# Patient Record
Sex: Male | Born: 1945
Health system: Southern US, Community
[De-identification: ages and names within clinical notes are randomized; demographics above are authoritative.]

## PROBLEM LIST (undated history)

## (undated) ENCOUNTER — Ambulatory Visit (INDEPENDENT_AMBULATORY_CARE_PROVIDER_SITE_OTHER): Admission: EM | Payer: Medicare Other

## (undated) DIAGNOSIS — D649 Anemia, unspecified: Secondary | ICD-10-CM

## (undated) DIAGNOSIS — S069X9A Unspecified intracranial injury with loss of consciousness of unspecified duration, initial encounter: Secondary | ICD-10-CM

## (undated) DIAGNOSIS — G4730 Sleep apnea, unspecified: Secondary | ICD-10-CM

## (undated) DIAGNOSIS — I10 Essential (primary) hypertension: Secondary | ICD-10-CM

## (undated) DIAGNOSIS — C61 Malignant neoplasm of prostate: Secondary | ICD-10-CM

## (undated) HISTORY — PX: BASAL CELL CARCINOMA EXCISION: SHX1214

## (undated) HISTORY — DX: Sleep apnea, unspecified: G47.30

## (undated) HISTORY — PX: PROSTATE BIOPSY: SHX241

## (undated) HISTORY — DX: Essential (primary) hypertension: I10

## (undated) HISTORY — PX: BACK SURGERY: SHX140

## (undated) HISTORY — DX: Unspecified intracranial injury with loss of consciousness of unspecified duration, initial encounter: S06.9X9A

---

## 2012-11-03 HISTORY — PX: BILATERAL CARPAL TUNNEL RELEASE: SHX6508

## 2013-11-03 DIAGNOSIS — S069XAA Unspecified intracranial injury with loss of consciousness status unknown, initial encounter: Secondary | ICD-10-CM

## 2013-11-03 DIAGNOSIS — S069X9A Unspecified intracranial injury with loss of consciousness of unspecified duration, initial encounter: Secondary | ICD-10-CM

## 2013-11-03 HISTORY — DX: Unspecified intracranial injury with loss of consciousness of unspecified duration, initial encounter: S06.9X9A

## 2013-11-03 HISTORY — DX: Unspecified intracranial injury with loss of consciousness status unknown, initial encounter: S06.9XAA

## 2017-11-24 ENCOUNTER — Institutional Professional Consult (permissible substitution): Payer: Self-pay | Admitting: Neurology

## 2017-12-02 ENCOUNTER — Encounter: Payer: Self-pay | Admitting: Neurology

## 2017-12-03 ENCOUNTER — Encounter: Payer: Self-pay | Admitting: Neurology

## 2017-12-03 ENCOUNTER — Ambulatory Visit (INDEPENDENT_AMBULATORY_CARE_PROVIDER_SITE_OTHER): Payer: Medicare Other | Admitting: Neurology

## 2017-12-03 VITALS — BP 162/80 | HR 73 | Ht 68.0 in | Wt 155.0 lb

## 2017-12-03 DIAGNOSIS — G4739 Other sleep apnea: Secondary | ICD-10-CM | POA: Insufficient documentation

## 2017-12-03 DIAGNOSIS — S069X1S Unspecified intracranial injury with loss of consciousness of 30 minutes or less, sequela: Secondary | ICD-10-CM | POA: Diagnosis not present

## 2017-12-03 DIAGNOSIS — S060X1A Concussion with loss of consciousness of 30 minutes or less, initial encounter: Secondary | ICD-10-CM | POA: Insufficient documentation

## 2017-12-03 DIAGNOSIS — G4733 Obstructive sleep apnea (adult) (pediatric): Secondary | ICD-10-CM | POA: Diagnosis not present

## 2017-12-03 DIAGNOSIS — S069XAA Unspecified intracranial injury with loss of consciousness status unknown, initial encounter: Secondary | ICD-10-CM | POA: Insufficient documentation

## 2017-12-03 DIAGNOSIS — G4731 Primary central sleep apnea: Secondary | ICD-10-CM

## 2017-12-03 DIAGNOSIS — S060X1S Concussion with loss of consciousness of 30 minutes or less, sequela: Secondary | ICD-10-CM | POA: Diagnosis not present

## 2017-12-03 DIAGNOSIS — Z9989 Dependence on other enabling machines and devices: Secondary | ICD-10-CM | POA: Diagnosis not present

## 2017-12-03 DIAGNOSIS — S069X9A Unspecified intracranial injury with loss of consciousness of unspecified duration, initial encounter: Secondary | ICD-10-CM | POA: Insufficient documentation
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
## 2017-12-07 ENCOUNTER — Other Ambulatory Visit: Payer: Self-pay | Admitting: Neurology

## 2017-12-07 DIAGNOSIS — G4733 Obstructive sleep apnea (adult) (pediatric): Secondary | ICD-10-CM

## 2017-12-10 ENCOUNTER — Institutional Professional Consult (permissible substitution): Payer: Self-pay | Admitting: Neurology

## 2018-01-24 ENCOUNTER — Ambulatory Visit (INDEPENDENT_AMBULATORY_CARE_PROVIDER_SITE_OTHER): Payer: Medicare Other | Admitting: Neurology

## 2018-01-24 DIAGNOSIS — G4731 Primary central sleep apnea: Secondary | ICD-10-CM

## 2018-01-24 DIAGNOSIS — G4733 Obstructive sleep apnea (adult) (pediatric): Secondary | ICD-10-CM

## 2018-01-24 DIAGNOSIS — S069X1S Unspecified intracranial injury with loss of consciousness of 30 minutes or less, sequela: Secondary | ICD-10-CM

## 2018-01-24 DIAGNOSIS — Z9989 Dependence on other enabling machines and devices: Secondary | ICD-10-CM

## 2018-01-24 DIAGNOSIS — S060X1S Concussion with loss of consciousness of 30 minutes or less, sequela: Secondary | ICD-10-CM

## 2018-01-24 DIAGNOSIS — G4739 Other sleep apnea: Secondary | ICD-10-CM
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
## 2018-01-29 NOTE — Addendum Note (Signed)
Addended by: Larey Seat on: 01/29/2018 02:46 PM   Modules accepted: Orders

## 2018-02-02 ENCOUNTER — Telehealth: Payer: Self-pay | Admitting: Neurology

## 2018-02-02 NOTE — Telephone Encounter (Signed)
I called pt. I advised pt that Dr. Brett Fairy reviewed their sleep study results and found that pt has sleep apnea. Dr. Brett Fairy recommends that pt reset his current CPAP at a pressure of 7-10 cm of water pressure. I reviewed PAP compliance expectations with the pt. Pt is agreeable to starting a CPAP. I advised pt that an order will be sent to a DME, [facility], and [facility] will call the pt within about one week after they file with the pt's insurance. [facility] will show the pt how to use the machine, fit for masks, and troubleshoot the CPAP if needed. Pt verbalized understanding of results. Pt had no questions at this time but was encouraged to call back if questions arise. I will touch base with [facility] but I don't believe the pt has to follow up except yearly.

## 2018-02-02 NOTE — Telephone Encounter (Signed)
-----   Message from Larey Seat, MD sent at 01/29/2018  2:46 PM EDT ----- Complex -Obstructive Sleep Apnea responded well to 8  and 9 cm water CPAP pressure, with isolated central apneas.  1. Under CPAP a rebound of REM sleep was noted. 2. Frequent PLMs were recorded which were only seen in N- REM sleep.  PLANS/RECOMMENDATIONS: Cody Gonzalez will remain on auto CPAP,  and the machine will be set to a narrow  pressure window from 7 through  10 cm water, without EPR. His apnea responded well, and oxygen  saturation was sufficient.  He remains on nasal interface. The patient used his own WISP nasal mask in large size.

## 2018-08-24 ENCOUNTER — Ambulatory Visit (INDEPENDENT_AMBULATORY_CARE_PROVIDER_SITE_OTHER): Payer: Medicare Other | Admitting: Neurology

## 2018-08-24 ENCOUNTER — Encounter: Payer: Self-pay | Admitting: Neurology

## 2018-08-24 VITALS — BP 157/75 | HR 65 | Ht 67.0 in | Wt 160.0 lb

## 2018-08-24 DIAGNOSIS — Z9989 Dependence on other enabling machines and devices: Secondary | ICD-10-CM | POA: Diagnosis not present

## 2018-08-24 DIAGNOSIS — G3184 Mild cognitive impairment, so stated: Secondary | ICD-10-CM

## 2018-08-24 DIAGNOSIS — G4733 Obstructive sleep apnea (adult) (pediatric): Secondary | ICD-10-CM

## 2018-08-24 DIAGNOSIS — G4731 Primary central sleep apnea: Secondary | ICD-10-CM | POA: Diagnosis not present
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
## 2018-08-25 ENCOUNTER — Telehealth: Payer: Self-pay | Admitting: *Deleted

## 2018-08-25 NOTE — Telephone Encounter (Signed)
Pt came by for cpap card download.  Downloaded to [facility].  Report to Dr. Brett Fairy to review.

## 2018-08-25 NOTE — Addendum Note (Signed)
Addended by: Larey Seat on: 08/25/2018 04:47 PM   Modules accepted: Orders

## 2018-08-27 NOTE — Telephone Encounter (Signed)
Called pt and LVM (ok per DPR) informing pt that Dr. Brett Fairy will look over his report and then we will call him back.

## 2018-08-27 NOTE — Telephone Encounter (Signed)
Pt requesting a call to discuss his CPAP report

## 2018-08-30 NOTE — Telephone Encounter (Signed)
Pt returning RN calls, will be home until 10:30

## 2018-08-30 NOTE — Telephone Encounter (Signed)
There was a phone message dictated and forwarded to [facility] with the results in summary- excellent compliance, residual AHi a little bit higher than optimal.

## 2018-08-30 NOTE — Telephone Encounter (Signed)
LMVM this am mobile, that AHI was elevated at 9.9.  Did make some changes with cpap and orders sent to [facility].  Pt to call back if questions. Pt called back and I reiterated the results and recommendation.  Will forward new orders to [facility]. He verbalized understanding.

## 2018-09-01 NOTE — Telephone Encounter (Signed)
Cody Gonzalez  From [facility] got order for cpap.

## 2019-02-22 ENCOUNTER — Other Ambulatory Visit (HOSPITAL_COMMUNITY): Payer: Self-pay | Admitting: Urology

## 2019-02-22 ENCOUNTER — Other Ambulatory Visit: Payer: Self-pay | Admitting: Urology

## 2019-02-22 DIAGNOSIS — C61 Malignant neoplasm of prostate: Secondary | ICD-10-CM

## 2019-02-24 ENCOUNTER — Ambulatory Visit: Payer: Medicare Other | Admitting: Neurology

## 2019-02-25 ENCOUNTER — Other Ambulatory Visit: Payer: Self-pay

## 2019-02-25 ENCOUNTER — Encounter (HOSPITAL_COMMUNITY)
Admission: RE | Admit: 2019-02-25 | Discharge: 2019-02-25 | Disposition: A | Discharge status: Home / Self Care | Payer: Medicare Other | Source: Ambulatory Visit | Attending: Urology | Admitting: Urology

## 2019-02-25 DIAGNOSIS — C61 Malignant neoplasm of prostate: Secondary | ICD-10-CM | POA: Diagnosis not present

## 2019-02-25 IMAGING — NM NUCLEAR MEDICINE WHOLE BODY BONE SCINTIGRAPHY
4 series · 4 of 4 positions shown · non-contrast
Comparison: None.

CLINICAL DATA: Prostate cancer

EXAM:
NUCLEAR MEDICINE WHOLE BODY BONE SCAN
TECHNIQUE: Whole body anterior and posterior images were obtained approximately
3 hours after intravenous injection of radiopharmaceutical.
RADIOPHARMACEUTICALS:  19.7 mCi [I4] MDP IV

[Series 1: whole body · 2.66mm/px · 1 of 1 slices shown (1 of 2)]
[im 1/1]
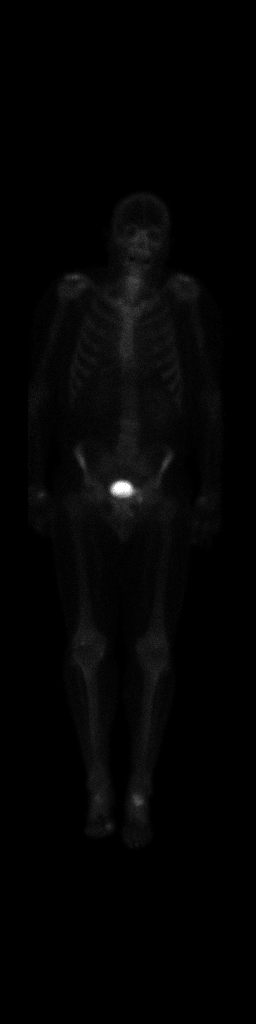

[Series 1: whole body · 2.66mm/px · 1 of 1 slices shown (2 of 2)]
[im 1/1]
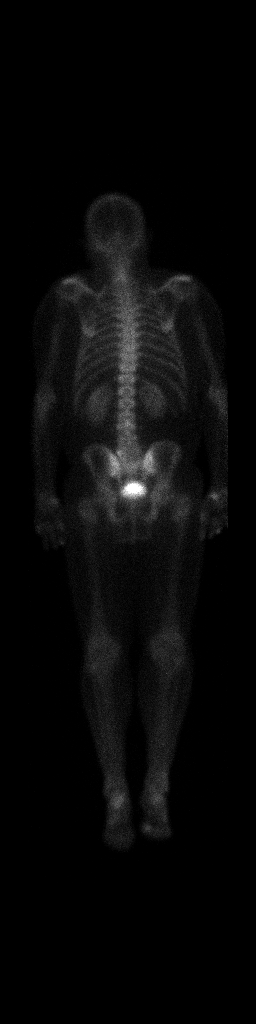

[Series 1: wbr_bone_40 whole body · 2.66mm/px · 1 of 1 slices shown (1 of 2)]
[im 1/1]
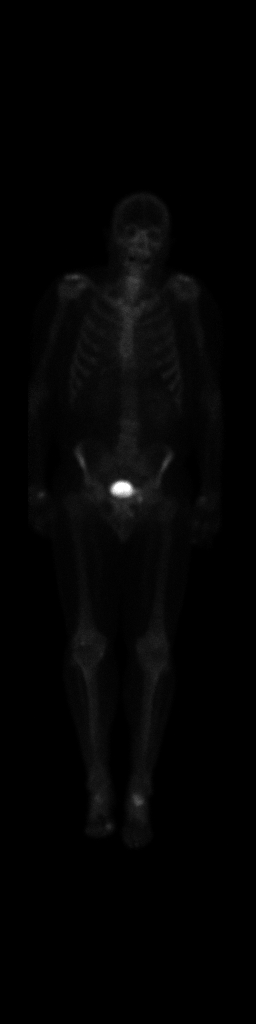

[Series 1: wbr_bone_40 whole body · 2.66mm/px · 1 of 1 slices shown (2 of 2)]
[im 1/1]
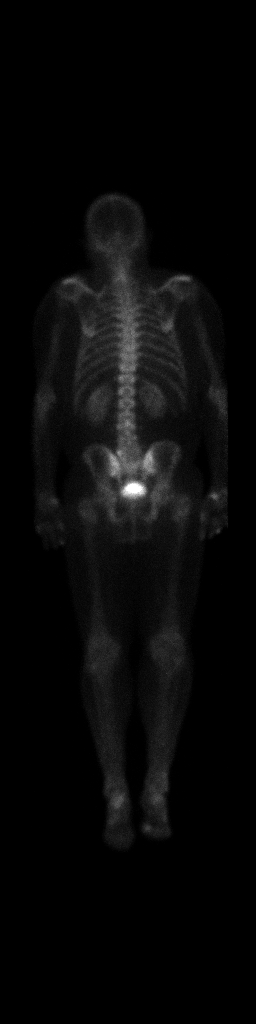

[4 of 4 positions shown; findings below may reference images not displayed]

FINDINGS: No areas of suspicious bony uptake to suggest osseous metastatic
disease. Increased uptake noted in the feet and hands/wrists
compatible with degenerative changes.
IMPRESSION: No evidence of osseous metastatic disease.

## 2019-02-25 MED ORDER — TECHNETIUM TC 99M MEDRONATE IV KIT
19.7000 | PACK | Freq: Once | INTRAVENOUS | Status: AC | PRN
  Administered 2019-02-25: 19.7 via INTRAVENOUS

## 2019-03-09 ENCOUNTER — Telehealth: Payer: Self-pay | Admitting: Neurology

## 2019-03-09 NOTE — Telephone Encounter (Signed)
Pt consented to a Virtual Visit and for the insurance to be billed as such. Cell phone number has been confirmed and the phone carrier they use is [employer].

## 2019-03-14 ENCOUNTER — Encounter: Payer: Self-pay | Admitting: Neurology

## 2019-03-14 NOTE — Telephone Encounter (Signed)

## 2019-03-15 ENCOUNTER — Ambulatory Visit: Payer: Medicare Other | Admitting: Neurology

## 2019-03-15 ENCOUNTER — Other Ambulatory Visit: Payer: Self-pay

## 2019-03-15 ENCOUNTER — Ambulatory Visit (INDEPENDENT_AMBULATORY_CARE_PROVIDER_SITE_OTHER): Payer: Medicare Other | Admitting: Neurology

## 2019-03-15 ENCOUNTER — Encounter: Payer: Self-pay | Admitting: Neurology

## 2019-03-15 DIAGNOSIS — G3184 Mild cognitive impairment, so stated: Secondary | ICD-10-CM | POA: Diagnosis not present

## 2019-03-15 DIAGNOSIS — G4733 Obstructive sleep apnea (adult) (pediatric): Secondary | ICD-10-CM | POA: Diagnosis not present

## 2019-03-15 DIAGNOSIS — R2681 Unsteadiness on feet: Secondary | ICD-10-CM | POA: Diagnosis not present

## 2019-03-15 DIAGNOSIS — Z9989 Dependence on other enabling machines and devices: Secondary | ICD-10-CM | POA: Diagnosis not present

## 2019-03-15 DIAGNOSIS — R4701 Aphasia: Secondary | ICD-10-CM | POA: Insufficient documentation

## 2019-03-15 NOTE — Patient Instructions (Signed)
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
## 2019-03-21 ENCOUNTER — Encounter: Payer: Self-pay | Admitting: Radiation Oncology

## 2019-03-21 NOTE — Progress Notes (Addendum)
GU Location of Tumor / Histology: prostatic adenocarcinoma  If Prostate Cancer, Gleason Score is (4 + 4) and PSA is (9.06)  Cody Gonzalez was noted to have a PSA of 8.46 during a screening with his PCP. Repeat PSA two weeks later confirmed a PSA of 8.56. Patient reports prior PSA in [place] was <4.   Biopsies of prostate (if applicable) revealed:    Past/Anticipated interventions by urology, if any: prostate biopsy, ct (negative), bone scan (negative), referral for consideration of radiotherapy  Surgery vs. Radiation. Patient leaning toward surgery.  Past/Anticipated interventions by medical oncology, if any: no  Weight changes, if any: no  Bowel/Bladder complaints, if any: IPSS 6. SHIM 8. Reports ED managed with sildenafil. Denies dysuria or hematuria. Reports blood tinged semen with orgasm.  Denies urinary leakage or incontinence.  Nausea/Vomiting, if any: no  Pain issues, if any:  no  SAFETY ISSUES:  Prior radiation? no  Pacemaker/ICD? no  Possible current pregnancy? no, male patient  Is the patient on methotrexate? no  Current Complaints / other details:  73 year old male. Married with one daughter and two grandsons. 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   Scheduled to follow up with Dr. Tresa Moore June 2.         NKDA. Preferred name: Rod

## 2019-03-22 ENCOUNTER — Other Ambulatory Visit: Payer: Self-pay

## 2019-03-22 ENCOUNTER — Ambulatory Visit
Admission: RE | Admit: 2019-03-22 | Discharge: 2019-03-22 | Disposition: A | Discharge status: Home / Self Care | Payer: Medicare Other | Source: Ambulatory Visit | Attending: Radiation Oncology | Admitting: Radiation Oncology

## 2019-03-22 ENCOUNTER — Telehealth: Payer: Self-pay | Admitting: *Deleted

## 2019-03-22 ENCOUNTER — Encounter: Payer: Self-pay | Admitting: Radiation Oncology

## 2019-03-22 VITALS — Ht 68.2 in | Wt 165.0 lb

## 2019-03-22 DIAGNOSIS — C61 Malignant neoplasm of prostate: Secondary | ICD-10-CM | POA: Insufficient documentation

## 2019-03-22 HISTORY — DX: Malignant neoplasm of prostate: C61

## 2019-03-22 NOTE — Progress Notes (Signed)
Radiation Oncology         (336) [phone] ________________________________  Initial Outpatient Consultation - Conducted via WebEx due to current COVID-19 concerns for limiting patient exposure  Name: Cody Gonzalez MRN: [id number]  Date: 03/22/2019  DOB: 1946-05-08  NL:[id number], Beryle Lathe, FNP  Alexis Frock, MD   REFERRING PHYSICIAN: Alexis Frock, MD  DIAGNOSIS: 73 y.o. gentleman with Stage T1c adenocarcinoma of the prostate with Gleason score of 4+4 and PSA of 9.06.    ICD-10-CM   1. Malignant neoplasm of prostate ([place]) C61     HISTORY OF PRESENT ILLNESS: Cody Gonzalez is a 73 y.o. male with a diagnosis of prostate cancer. He was noted to have an elevated PSA in October 2019 of 8.46 by his primary care provider, Jillyn Ledger, NP.  He had recently relocated from [place], [place] to [place] to be closer to his grandkids and reports normal PSA values with his urologist in [place] previously.  Repeat PSA two weeks later remained elevated at 8.56.  Accordingly, he was referred for evaluation in urology by Dr. Tresa Moore on 09/28/2018, and digital rectal examination was performed at that time revealing mild asymmetry, but no frank nodules. The recommendation at that time was to proceed with PSA monitoring. A repeat PSA 01/20/19 was elevated further to 9.06. Therefore, the patient proceeded to transrectal ultrasound with 12 biopsies of the prostate on 02/15/2019.  The prostate volume measured 34 cc.  Out of 12 core biopsies, 6 were positive, all on the left.  The maximum Gleason score was 4+4, and this was seen in all six cores on the left.  Biopsies of prostate revealed:    Staging studies include bone scan performed 02/25/2019 which was negative for osseous metastatic disease and a CT scan of the abdomen/pelvis on 03/03/2019 which was negative for adenopathy or visceral metastatic disease in the abdomen or pelvis but did note a small 4 mm pulmonary nodule as well as a very small, 6 mm  left renal mass which were felt to be indeterminate and the recommendation was to continue with monitoring.   The patient reviewed the biopsy and imaging results with his urologist and he has kindly been referred today for discussion of potential radiation treatment options.  PREVIOUS RADIATION THERAPY: No  PAST MEDICAL HISTORY:  Past Medical History:  Diagnosis Date   Hypertension    Prostate cancer ([place])    Sleep apnea    TBI (traumatic brain injury) ([place])       PAST SURGICAL HISTORY: Past Surgical History:  Procedure Laterality Date   PROSTATE BIOPSY      FAMILY HISTORY:  Family History  Problem Relation Age of Onset   Brain cancer Father        GBM    SOCIAL HISTORY:  Social History   Socioeconomic History   Marital status: Married    Spouse name: Not on file   Number of children: Not on file   Years of education: Not on file   Highest education level: Not on file  Occupational History   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: retired  Scientist, product/process development strain: Not on file   Food insecurity:    Worry: Not on file    Inability: Not on file   Transportation needs:    Medical: Not on file    Non-medical: Not on file  Tobacco Use   Smoking status: Never Smoker   Smokeless tobacco: Never Used  Substance and Sexual Activity   Alcohol  use: Yes    Alcohol/week: 14.0 standard drinks    Types: 14 Cans of beer per week    Frequency: Never   Drug use: No   Sexual activity: Yes    Comment: with aid of sildenafil  Lifestyle   Physical activity:    Days per week: Not on file    Minutes per session: Not on file   Stress: Not on file  Relationships   Social connections:    Talks on phone: Not on file    Gets together: Not on file    Attends religious service: Not on file    Active member of club or organization: Not on file    Attends meetings of clubs or organizations: Not on file    Relationship status: Not on file   Intimate  partner violence:    Fear of current or ex partner: Not on file    Emotionally abused: Not on file    Physically abused: Not on file    Forced sexual activity: Not on file  Other Topics Concern   Not on file  Social History Narrative   Married. 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 Preferred name: Cody Gonzalez. Enjoys cycling.    ALLERGIES: Patient has no known allergies.  MEDICATIONS:  Current Outpatient Medications  Medication Sig Dispense Refill   amLODipine (NORVASC) 5 MG tablet Take 5 mg by mouth daily.     lisinopril (PRINIVIL,ZESTRIL) 2.5 MG tablet Take 2.5 mg by mouth daily.     No current facility-administered medications for this encounter.     REVIEW OF SYSTEMS:  On review of systems, the patient reports that he is doing well overall.  He remains physically active and enjoys cycling.  He denies any chest pain, shortness of breath, cough, fevers, chills, night sweats, or unintended weight changes. He denies any bowel disturbances, and denies abdominal pain, nausea or vomiting. He denies any new musculoskeletal or joint aches or pains. His IPSS was 6, indicating mild urinary symptoms. He denies dysuria, hematuria, leakage or incontinence. His SHIM was 8, indicating he does have moderate erectile dysfunction, managed with sildenafil. He reports blood-tinged semen on occasion. A complete review of systems is obtained and is otherwise negative.  PHYSICAL EXAM:  Wt Readings from Last 3 Encounters:  03/22/19 165 lb (74.8 kg)  08/24/18 160 lb (72.6 kg)  12/03/17 155 lb (70.3 kg)   Temp Readings from Last 3 Encounters:  No data found for Temp   BP Readings from Last 3 Encounters:  08/24/18 (!) 157/75  12/03/17 (!) 162/80   Pulse Readings from Last 3 Encounters:  08/24/18 65  12/03/17 73   Pain Assessment Pain Score: 0-No pain/10  In general this is a well appearing caucasian male in no acute distress. He is alert and oriented x4 and  appropriate throughout the examination. Cardiopulmonary assessment is negative for acute distress and he exhibits normal effort. Remainder of exam is not performed in light of virtual consultation platform.  KPS = 100  100 - Normal; no complaints; no evidence of disease. 90   - Able to carry on normal activity; minor signs or symptoms of disease. 80   - Normal activity with effort; some signs or symptoms of disease. 29   - Cares for self; unable to carry on normal activity or to do active work. 60   - Requires occasional assistance, but is able to care for most of his personal needs. 50   -  Requires considerable assistance and frequent medical care. 93   - Disabled; requires special care and assistance. 63   - Severely disabled; hospital admission is indicated although death not imminent. 53   - Very sick; hospital admission necessary; active supportive treatment necessary. 10   - Moribund; fatal processes progressing rapidly. 0     - Dead  Karnofsky DA, Abelmann WH, Craver LS and Burchenal JH [phone]) The use of the nitrogen mustards in the palliative treatment of carcinoma: with particular reference to bronchogenic carcinoma Cancer 1 634-56  LABORATORY DATA:  No results found for: WBC, HGB, HCT, MCV, PLT No results found for: NA, K, CL, CO2 No results found for: ALT, AST, GGT, ALKPHOS, BILITOT   RADIOGRAPHY: Nm Bone Scan Whole Body  Result Date: 02/25/2019 CLINICAL DATA:  Prostate cancer EXAM: NUCLEAR MEDICINE WHOLE BODY BONE SCAN TECHNIQUE: Whole body anterior and posterior images were obtained approximately 3 hours after intravenous injection of radiopharmaceutical. RADIOPHARMACEUTICALS:  19.7 mCi Technetium-11m MDP IV COMPARISON:  None. FINDINGS: No areas of suspicious bony uptake to suggest osseous metastatic disease. Increased uptake noted in the feet and hands/wrists compatible with degenerative changes. IMPRESSION: No evidence of osseous metastatic disease. Electronically Signed   By:  Rolm Baptise M.D.   On: 02/25/2019 19:09      IMPRESSION/PLAN: 1. 73 y.o. gentleman with Stage T1c adenocarcinoma of the prostate with Gleason Score of 4+4, and PSA of 9.06. We discussed the patient's workup and outlined the nature of prostate cancer in this setting. The patient's T stage, Gleason's score, and PSA put him into the high risk group. Accordingly, he is eligible for a variety of potential treatment options including radical prostatectomy or LT-ADT in combination with either 8 weeks of daily external beam radiation or a brachytherapy boost followed by 5 weeks of daily external beam radiation. We discussed the available radiation techniques, and focused on the details and logistics of delivery. We discussed and outlined the risks, benefits, short and long-term effects associated with radiotherapy and compared and contrasted these with prostatectomy. We discussed the role of SpaceOAR in reducing the rectal toxicity associated with radiotherapy. We also detailed the role of ADT in the treatment of high risk prostate cancer and outlined the associated side effects that could be expected with this therapy.  The patient and his wife were encouraged to ask questions that were answered to their stated satisfaction.   At the end of the conversation the patient is interested in moving forward with LT-ADT in combination with an up front brachytherapy boost with placement of SpaceOAR gel to reduce rectal toxicity from radiotherapy followed by a 5 week course of daily radiotherapy. He has not received his first Lupron injection. We will share our discussion with Dr. Tresa Moore and move forward with scheduling a follow up visit to start ADT now with plans to coordinate his brachytherapy boost from sometime in July 2020, approximately 8 weeks after starting ADT.  Romie Jumper in our office will be working closely with him to coordinate OR scheduling and pre and post procedure appointments.  We will contact the  pharmaceutical rep to ensure that [place] is available at the time of procedure.  He will have a prostate MRI following his post-seed CT SIM to confirm appropriate distribution of the [place]. He will also have prostate CT SIM at the time of his post-seed visit for treatment planning/set up to begin 5 weeks of prostate IMRT approximately 3 weeks after his brachytherapy procedure.  The patient appears to  have a good understanding of his overall disease and our treatment recommendations which are of curative intent.  He is comfortable with and in agreement with the stated plan.   This encounter was provided by video telemedicine platform Webex.  The patient has given verbal consent for this type of encounter and has been advised to only accept a meeting of this type in a secure network environment. The time spent during this encounter was 70 minutes. The attendants for this meeting include Tyler Pita MD, Freeman Caldron PA-C, scribe Clinton Sawyer, patient Cody Gonzalez and his wife. During the encounter, Tyler Pita MD, Ashlyn Bruning PA-C, and scribe Clinton Sawyer were located at [facility] Radiation Oncology Department.  Patient Cody Gonzalez and wife were located at home.     Cody Johns, PA-C    Tyler Pita, MD  [facility] Direct Dial: [phone]   Fax: [phone] [email or web address]   Skype   LinkedIn  This document serves as a record of services personally performed by Tyler Pita, MD and Freeman Caldron, PA-C. It was created on their behalf by Rae Lips, a trained medical scribe. The creation of this record is based on the scribe's personal observations and the providers' statements to them. This document has been checked and approved by the attending providers.

## 2019-03-22 NOTE — Progress Notes (Signed)
See progress note under physician encounter. 

## 2019-03-22 NOTE — Telephone Encounter (Signed)
CALLED PATIENT TO INFORM OF ADT APPT. WITH DR. Davis ON 04-05-19 - ARRIVAL TIME- 8 AM, SPOKE WITH PATIENT AND HE IS AWARE OF THIS APPT.

## 2019-04-05 ENCOUNTER — Encounter: Payer: Self-pay | Admitting: Medical Oncology

## 2019-04-05 ENCOUNTER — Telehealth: Payer: Self-pay | Admitting: Medical Oncology

## 2019-04-05 NOTE — Telephone Encounter (Signed)
Called l to introduce myself as the prostate nurse navigator and discuss my role. I was unable to meet him 5/19 when he consulted with Dr. Tammi Klippel via WebEx. He was seen today in follow up by Dr. Tresa Moore and started on Bicalutamide and will return 6/16 for Lupron injection. I informed him I am working remotely and asked him to leave questions or concerns on my voicemail and I will return his call.

## 2019-04-08 ENCOUNTER — Telehealth: Payer: Self-pay | Admitting: Radiation Oncology

## 2019-04-08 NOTE — Telephone Encounter (Signed)
Received voicemail message from patient that he is attempting to return a call from "someone in this office." Phone patient back. No answer. Left voicemail message that the last telephone encounter was on 04/05/2019 from Cira Rue, prostate navigator. I left Robin's number as well as mine in the event the patient wanted to reach out or had needs.

## 2019-04-21 ENCOUNTER — Telehealth: Payer: Self-pay | Admitting: *Deleted

## 2019-04-21 NOTE — Telephone Encounter (Signed)
CALLED PATIENT TO INFORM OF PRE-SEED PLANNING CT ON 05-19-19 AND HIS IMPLANT ON 06-29-19, SPOKE WITH PATIENT AND HE IS AWARE OF THESE APPTS.

## 2019-04-22 ENCOUNTER — Other Ambulatory Visit: Payer: Self-pay | Admitting: Urology

## 2019-04-22 ENCOUNTER — Telehealth: Payer: Self-pay | Admitting: Radiation Oncology

## 2019-04-22 NOTE — Telephone Encounter (Signed)
Received voicemail message from patient's wife expressing confusion about treatment plan. Phoned back and spoke with patient and wife over speaker phone. Both are of the understanding that two weeks s/p ADT injection his seeds would be implanted. Patient endorses receiving ADT on 04/19/2019. Patient expressed he was surprised when Enid Derry called and scheduled him out for two months. Wife adds they were both of the understanding that this "ordeal would be over come the end of August." Read to them consult note impression:  At the end of the conversation the patient is interested in moving forward with LT-ADT in combination with an up front brachytherapy boost with placement of SpaceOAR gel to reduce rectal toxicity from radiotherapy followed by a 5 week course of daily radiotherapy. He has not received his first Lupron injection. We will share our discussion with Dr. Tresa Moore and move forward with scheduling a follow up visit to start ADT now with plans to coordinate his brachytherapy boost from sometime in July 2020, approximately 8 weeks after starting ADT.  Romie Jumper in our office will be working closely with him to coordinate OR scheduling and pre and post procedure appointments.  We will contact the pharmaceutical rep to ensure that [place] is available at the time of procedure.  He will have a prostate MRI following his post-seed CT SIM to confirm appropriate distribution of the [place]. He will also have prostate CT SIM at the time of his post-seed visit for treatment planning/set up to begin 5 weeks of prostate IMRT approximately 3 weeks after his brachytherapy procedure.  The patient appears to have a good understanding of his overall disease and our treatment recommendations which are of curative intent.  He is comfortable with and in agreement with the stated plan  Wife felt confirmation was warranted. Explained this RN will inform Dr. Tammi Klippel and Freeman Caldron, PA of these findings and myself or  Ashlyn would reach back out to them for further clarification. They were both in agreement with that plan

## 2019-04-22 NOTE — Telephone Encounter (Signed)
Phoned patient back. Spoke with he and his wife over speaker phone. Read the following: Confirmed, the information you shared with the patient and wife from his consult note is correct and is what was discussed during the visit.The reference to "completion of treatment by end of August" in the conversation was prefaced by "for example, if you are able to get in with Dr. Tresa Moore to start ADT in May, we would be ready to proceed with seed boost in July followed by 5 weeks of daily radiation treatment that could be completed by the end of August but this is dependant on how quickly Dr. Tresa Moore can get you in to start your ADT".  We had previously discussed the rationale for waiting 8 weeks after the start of ADT prior to initiating radiation so that the cancer cells would be sensitized to the radiation, increasing the effectiveness of the overall treatment.  I understand the frustration with the delay of treatment but it was completely out of our control as to when he could start ADT, likely secondary to Urology waiting for insurance approval, and it is to his advantage and in his best interest to wait the recommended 2 month time period from start of ADT before starting radiation to give him the best chance for treatment success/cure.  Please share this information with the patient and his wife and let me know if they need anything further. -Ashlyn  Answered subsequent questions to the best of my ability. Wife wants to ensure Dr. Tammi Klippel is aware the patient has ten pills of bicalutamide left which he began on 04/05/2019 before receiving Lupron on 04/19/2019. Confirmed pre seed appointments. Explained why pre seed CT scan was needed to the best of my ability. Wife reports the patient is scheduled to have blood work on 07/19/2019 at Dr. Zettie Pho office with a follow up to review the results on 07/26/2019.   Spent approximately 20 minutes on phone with patient and wife. Both verbalized understanding of all reviewed. Provided  my direct number for future questions.

## 2019-04-22 NOTE — Telephone Encounter (Signed)
During phone conversation at 1622 discussed with patient and wife how to set up mychart account providing activation code. Understanding of this process was verbalized by both.

## 2019-04-22 NOTE — Telephone Encounter (Signed)
Confirmed, the information you shared with the patient and wife from his consult note is correct and is what was discussed during the visit.The reference to "completion of treatment by end of August" in the conversation was prefaced by "for example, if you are able to get in with Dr. Tresa Moore to start ADT in May, we would be ready to proceed with seed boost in July followed by 5 weeks of daily radiation treatment that could be completed by the end of August but this is dependant on how quickly Dr. Tresa Moore can get you in to start your ADT".  We had previously discussed the rationale for waiting 8 weeks after the start of ADT prior to initiating radiation so that the cancer cells would be sensitized to the radiation, increasing the effectiveness of the overall treatment.  I understand the frustration with the delay of treatment but it was completely out of our control as to when he could start ADT, likely secondary to Urology waiting for insurance approval, and it is to his advantage and in his best interest to wait the recommended 2 month time period from start of ADT before starting radiation to give him the best chance for treatment success/cure.  Please share this information with the patient and his wife and let me know if they need anything further. -Karma Hiney

## 2019-05-09 ENCOUNTER — Telehealth: Payer: Self-pay | Admitting: Medical Oncology

## 2019-05-09 NOTE — Telephone Encounter (Signed)
Called patient and spoke with wife, Cody Gonzalez to confirm appointments and address any concerns. They have had concerns about treatment and scheduling. She states she was able to discuss with Sam and has a better understanding of plan. Appointments confirmed for CT simulation and seed implant. She voiced concerns about not being able to see records in My Chart and Sam was mailing her some information but she has not received. I informed her that the mail room was closed on Friday due to the 4th, so it may take a few extra days. She states that is fine. She was not able to locate my office number, so I gave it to her again and encouraged them  to call me or Sam, if she has further questions or concerns. She voiced understanding and thanked me for following up.

## 2019-05-18 ENCOUNTER — Telehealth: Payer: Self-pay | Admitting: *Deleted

## 2019-05-18 NOTE — Telephone Encounter (Signed)
CALLED PATIENT TO REMIND OF PRE-SEED APPTS. FOR 05-19-19, SPOKE WITH PATIENT AND HE IS AWARE OF THESE APPTS.

## 2019-05-19 ENCOUNTER — Ambulatory Visit
Admission: RE | Admit: 2019-05-19 | Discharge: 2019-05-19 | Disposition: A | Discharge status: Home / Self Care | Payer: Medicare Other | Source: Ambulatory Visit | Attending: Radiation Oncology | Admitting: Radiation Oncology

## 2019-05-19 ENCOUNTER — Other Ambulatory Visit: Payer: Self-pay | Admitting: Urology

## 2019-05-19 ENCOUNTER — Encounter: Payer: Self-pay | Admitting: Medical Oncology

## 2019-05-19 ENCOUNTER — Encounter (HOSPITAL_COMMUNITY)
Admission: RE | Admit: 2019-05-19 | Discharge: 2019-05-19 | Disposition: A | Discharge status: Home / Self Care | Payer: Medicare Other | Source: Ambulatory Visit | Attending: Urology | Admitting: Urology

## 2019-05-19 ENCOUNTER — Ambulatory Visit (HOSPITAL_COMMUNITY)
Admission: RE | Admit: 2019-05-19 | Discharge: 2019-05-19 | Disposition: A | Discharge status: Home / Self Care | Payer: Medicare Other | Source: Ambulatory Visit | Attending: Urology | Admitting: Urology

## 2019-05-19 ENCOUNTER — Other Ambulatory Visit: Payer: Self-pay

## 2019-05-19 ENCOUNTER — Ambulatory Visit
Admission: RE | Admit: 2019-05-19 | Discharge: 2019-05-19 | Disposition: A | Discharge status: Home / Self Care | Payer: Medicare Other | Source: Ambulatory Visit | Attending: Urology | Admitting: Urology

## 2019-05-19 ENCOUNTER — Encounter: Payer: Self-pay | Admitting: Adult Health

## 2019-05-19 DIAGNOSIS — C61 Malignant neoplasm of prostate: Secondary | ICD-10-CM | POA: Insufficient documentation

## 2019-05-19 DIAGNOSIS — Z01818 Encounter for other preprocedural examination: Secondary | ICD-10-CM | POA: Insufficient documentation

## 2019-05-19 IMAGING — DX CHEST - 2 VIEW
2 series · 2 of 2 positions shown · non-contrast
Comparison: None.

CLINICAL DATA: Preoperative respiratory evaluation prior to
brachytherapy for prostate cancer. Current history of sleep apnea.

EXAM:
CHEST - 2 VIEW

[chest pa]
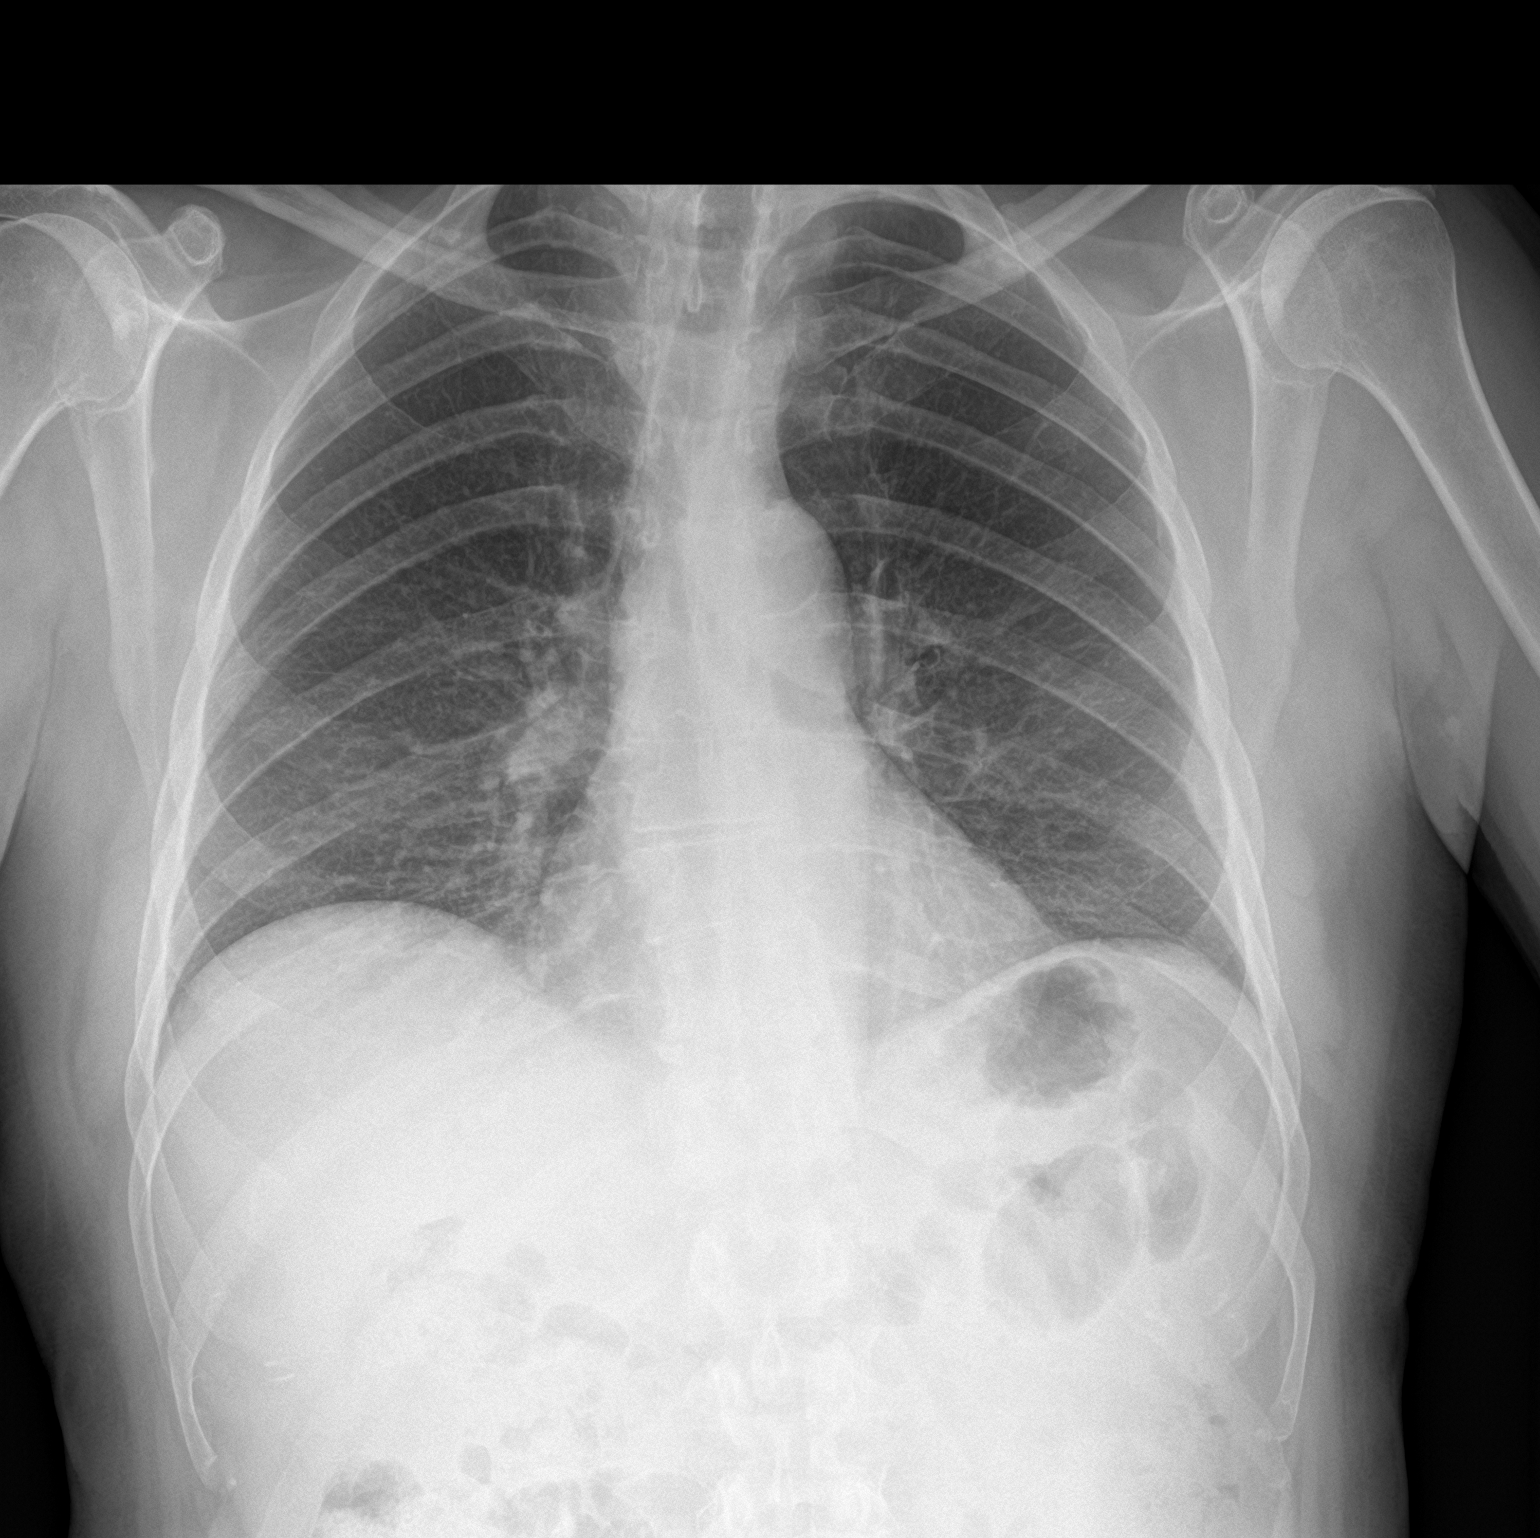

[chest lat]
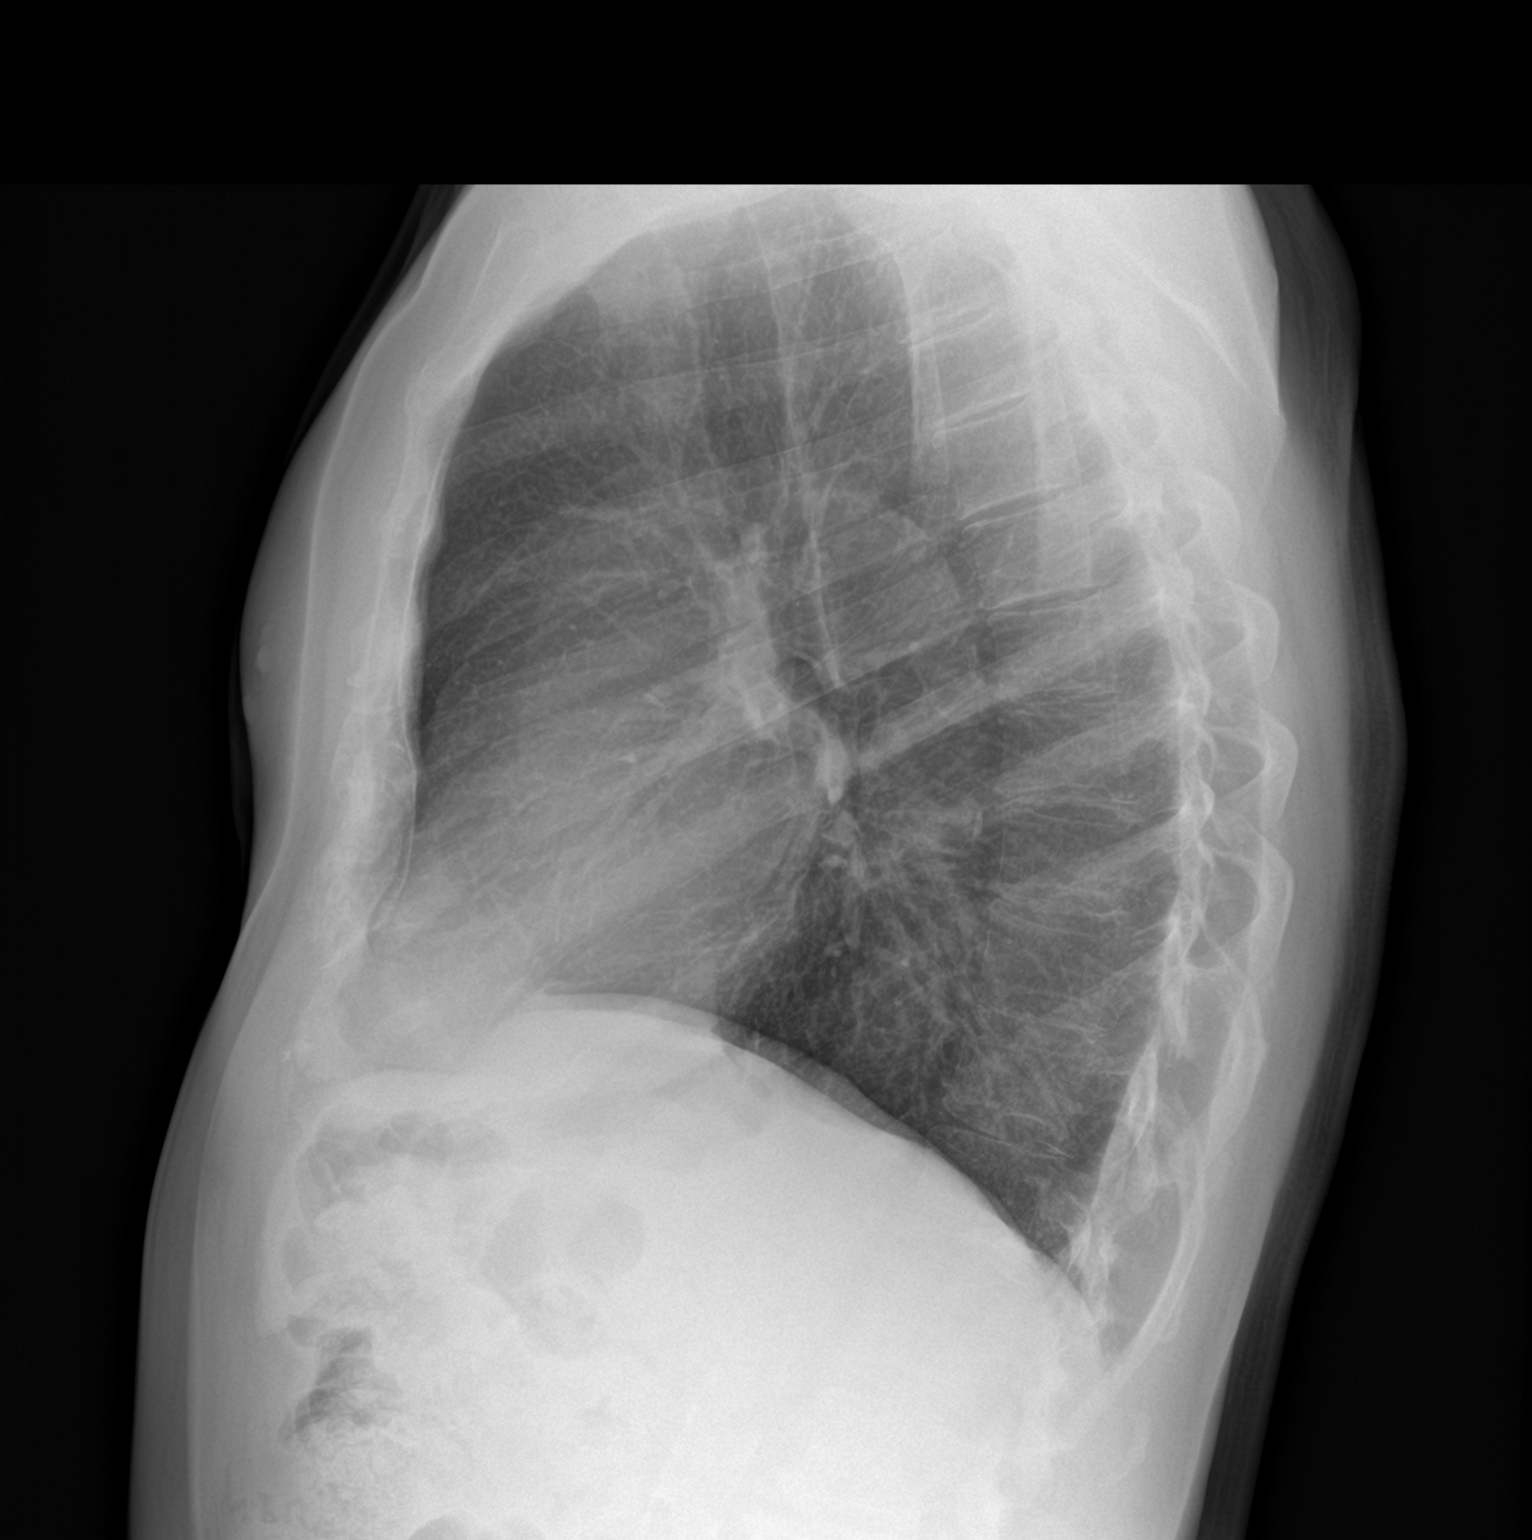

[2 of 2 positions shown; findings below may reference images not displayed]

FINDINGS: Cardiac silhouette normal in size. Thoracic aorta mildly
atherosclerotic. Hilar and mediastinal contours otherwise
unremarkable. Lungs clear. Bronchovascular markings normal.
Pulmonary vascularity normal. No visible pleural effusions. No
pneumothorax. Mild thoracic dextroscoliosis. Visualized bony thorax
otherwise unremarkable.
IMPRESSION: No acute cardiopulmonary disease.

## 2019-05-19 NOTE — Progress Notes (Signed)
  Radiation Oncology         (336) [phone] ________________________________  Name: Cody Gonzalez MRN: [id number]  Date: 05/19/2019  DOB: 01/17/1946  SIMULATION AND TREATMENT PLANNING NOTE PUBIC ARCH STUDY  VK:PQAES, Beryle Lathe, FNP  [name], FNP  DIAGNOSIS: 73 y.o. gentleman with Stage T1c adenocarcinoma of the prostate with Gleason score of 4+4 and PSA of 9.06.     ICD-10-CM   1. Malignant neoplasm of prostate ([place])  C61     COMPLEX SIMULATION:  The patient presented today for evaluation for possible prostate seed implant. He was brought to the radiation planning suite and placed supine on the CT couch. A 3-dimensional image study set was obtained in upload to the planning computer. There, on each axial slice, I contoured the prostate gland. Then, using three-dimensional radiation planning tools I reconstructed the prostate in view of the structures from the transperineal needle pathway to assess for possible pubic arch interference. In doing so, I did not appreciate any pubic arch interference. Also, the patient's prostate volume was estimated based on the drawn structure. The volume was 26 cc which is less than his ultrasound volume of 34 cc, probably due to ADT.  Given the pubic arch appearance and prostate volume, patient remains a good candidate to proceed with prostate seed implant. Today, he freely provided informed written consent to proceed.    PLAN: The patient will undergo prostate seed implant.   ________________________________  Sheral Apley. Tammi Klippel, M.D.

## 2019-06-22 ENCOUNTER — Telehealth: Payer: Self-pay | Admitting: *Deleted

## 2019-06-22 NOTE — Telephone Encounter (Signed)
RETURNED PATIENT'S PHONE CALL, LVM FOR A RETURN CALL 

## 2019-06-24 ENCOUNTER — Telehealth: Payer: Self-pay | Admitting: *Deleted

## 2019-06-24 NOTE — Telephone Encounter (Signed)
RETURNED PATIENT'S PHONE CALL, LVM  

## 2019-06-25 ENCOUNTER — Other Ambulatory Visit (HOSPITAL_COMMUNITY)
Admission: RE | Admit: 2019-06-25 | Discharge: 2019-06-25 | Disposition: A | Discharge status: Home / Self Care | Payer: Medicare Other | Source: Ambulatory Visit | Attending: Urology | Admitting: Urology

## 2019-06-25 DIAGNOSIS — Z20828 Contact with and (suspected) exposure to other viral communicable diseases: Secondary | ICD-10-CM | POA: Insufficient documentation

## 2019-06-25 DIAGNOSIS — Z01812 Encounter for preprocedural laboratory examination: Secondary | ICD-10-CM | POA: Insufficient documentation

## 2019-06-25 LAB — SARS CORONAVIRUS 2 (TAT 6-24 HRS): SARS Coronavirus 2: NEGATIVE

## 2019-06-27 ENCOUNTER — Encounter (HOSPITAL_COMMUNITY)
Admission: RE | Admit: 2019-06-27 | Discharge: 2019-06-27 | Disposition: A | Discharge status: Home / Self Care | Payer: Medicare Other | Source: Ambulatory Visit | Attending: Urology | Admitting: Urology

## 2019-06-27 ENCOUNTER — Other Ambulatory Visit: Payer: Self-pay

## 2019-06-27 DIAGNOSIS — I10 Essential (primary) hypertension: Secondary | ICD-10-CM | POA: Insufficient documentation

## 2019-06-27 DIAGNOSIS — Z79899 Other long term (current) drug therapy: Secondary | ICD-10-CM | POA: Insufficient documentation

## 2019-06-27 DIAGNOSIS — F419 Anxiety disorder, unspecified: Secondary | ICD-10-CM | POA: Diagnosis not present

## 2019-06-27 DIAGNOSIS — Z87820 Personal history of traumatic brain injury: Secondary | ICD-10-CM | POA: Insufficient documentation

## 2019-06-27 DIAGNOSIS — R4701 Aphasia: Secondary | ICD-10-CM | POA: Diagnosis not present

## 2019-06-27 DIAGNOSIS — G4730 Sleep apnea, unspecified: Secondary | ICD-10-CM | POA: Insufficient documentation

## 2019-06-27 DIAGNOSIS — G3184 Mild cognitive impairment, so stated: Secondary | ICD-10-CM | POA: Diagnosis not present

## 2019-06-27 DIAGNOSIS — Z808 Family history of malignant neoplasm of other organs or systems: Secondary | ICD-10-CM | POA: Diagnosis not present

## 2019-06-27 DIAGNOSIS — C61 Malignant neoplasm of prostate: Secondary | ICD-10-CM | POA: Insufficient documentation

## 2019-06-27 DIAGNOSIS — Z01812 Encounter for preprocedural laboratory examination: Secondary | ICD-10-CM | POA: Insufficient documentation

## 2019-06-27 LAB — CBC
HCT: 37.5 % — ABNORMAL LOW (ref 39.0–52.0)
Hemoglobin: 13.4 g/dL (ref 13.0–17.0)
MCH: 33.1 pg (ref 26.0–34.0)
MCHC: 35.7 g/dL (ref 30.0–36.0)
MCV: 92.6 fL (ref 80.0–100.0)
Platelets: 269 10*3/uL (ref 150–400)
RBC: 4.05 MIL/uL — ABNORMAL LOW (ref 4.22–5.81)
RDW: 11.6 % (ref 11.5–15.5)
WBC: 4.8 10*3/uL (ref 4.0–10.5)
nRBC: 0 % (ref 0.0–0.2)

## 2019-06-27 LAB — COMPREHENSIVE METABOLIC PANEL
ALT: 29 U/L (ref 0–44)
AST: 25 U/L (ref 15–41)
Albumin: 4.3 g/dL (ref 3.5–5.0)
Alkaline Phosphatase: 53 U/L (ref 38–126)
Anion gap: 11 (ref 5–15)
BUN: 18 mg/dL (ref 8–23)
CO2: 26 mmol/L (ref 22–32)
Calcium: 9.4 mg/dL (ref 8.9–10.3)
Chloride: 93 mmol/L — ABNORMAL LOW (ref 98–111)
Creatinine, Ser: 0.8 mg/dL (ref 0.61–1.24)
GFR calc Af Amer: >60 mL/min (ref >60)
GFR calc non Af Amer: >60 mL/min (ref >60)
Glucose, Bld: 106 mg/dL — ABNORMAL HIGH (ref 70–99)
Potassium: 4.2 mmol/L (ref 3.5–5.1)
Sodium: 130 mmol/L — ABNORMAL LOW (ref 135–145)
Total Bilirubin: 1.1 mg/dL (ref 0.3–1.2)
Total Protein: 7.4 g/dL (ref 6.5–8.1)

## 2019-06-27 LAB — APTT: aPTT: 31 seconds (ref 24–36)

## 2019-06-27 LAB — PROTIME-INR
INR: 1 (ref 0.8–1.2)
Prothrombin Time: 12.6 seconds (ref 11.4–15.2)

## 2019-06-28 ENCOUNTER — Encounter (HOSPITAL_BASED_OUTPATIENT_CLINIC_OR_DEPARTMENT_OTHER): Payer: Self-pay | Admitting: *Deleted

## 2019-06-28 ENCOUNTER — Telehealth: Payer: Self-pay | Admitting: *Deleted

## 2019-06-28 ENCOUNTER — Other Ambulatory Visit: Payer: Self-pay

## 2019-06-28 NOTE — Telephone Encounter (Signed)
CALLED PATIENT TO REMIND OF PROCEDURE FOR 06-29-19, LVM FOR A RETURN CALL

## 2019-06-28 NOTE — Progress Notes (Signed)
Spoke with Cody Gonzalez after midnight, arrive 630 am [email or web address] to take sip of water: amlodipine, fleets enema am before leave home, bring cpap mask tubing and machine. Labs on chart/epic: pt, ptt, cbc, cmet done 06-27-2019, chest xary 05-19-19, ekg 05-19-19. Has surgery orders in epic. Driver wife lenora cell [phone].

## 2019-06-29 ENCOUNTER — Ambulatory Visit (HOSPITAL_BASED_OUTPATIENT_CLINIC_OR_DEPARTMENT_OTHER): Payer: Medicare Other | Admitting: Emergency Medicine

## 2019-06-29 ENCOUNTER — Other Ambulatory Visit: Payer: Self-pay

## 2019-06-29 ENCOUNTER — Encounter (HOSPITAL_BASED_OUTPATIENT_CLINIC_OR_DEPARTMENT_OTHER): Payer: Self-pay | Admitting: *Deleted

## 2019-06-29 ENCOUNTER — Encounter (HOSPITAL_BASED_OUTPATIENT_CLINIC_OR_DEPARTMENT_OTHER)
Admission: RE | Disposition: A | Discharge status: Home / Self Care | Payer: Self-pay | Source: Home / Self Care | Attending: Urology

## 2019-06-29 ENCOUNTER — Ambulatory Visit (HOSPITAL_BASED_OUTPATIENT_CLINIC_OR_DEPARTMENT_OTHER)
Admission: RE | Admit: 2019-06-29 | Discharge: 2019-06-29 | Disposition: A | Discharge status: Home / Self Care | Payer: Medicare Other | Attending: Urology | Admitting: Urology

## 2019-06-29 ENCOUNTER — Ambulatory Visit (HOSPITAL_COMMUNITY): Payer: Medicare Other

## 2019-06-29 DIAGNOSIS — G4730 Sleep apnea, unspecified: Secondary | ICD-10-CM | POA: Diagnosis not present

## 2019-06-29 DIAGNOSIS — Z87820 Personal history of traumatic brain injury: Secondary | ICD-10-CM | POA: Insufficient documentation

## 2019-06-29 DIAGNOSIS — G3184 Mild cognitive impairment, so stated: Secondary | ICD-10-CM | POA: Insufficient documentation

## 2019-06-29 DIAGNOSIS — C61 Malignant neoplasm of prostate: Secondary | ICD-10-CM | POA: Insufficient documentation

## 2019-06-29 DIAGNOSIS — I10 Essential (primary) hypertension: Secondary | ICD-10-CM | POA: Insufficient documentation

## 2019-06-29 DIAGNOSIS — F419 Anxiety disorder, unspecified: Secondary | ICD-10-CM | POA: Diagnosis not present

## 2019-06-29 DIAGNOSIS — Z01818 Encounter for other preprocedural examination: Secondary | ICD-10-CM

## 2019-06-29 DIAGNOSIS — Z808 Family history of malignant neoplasm of other organs or systems: Secondary | ICD-10-CM | POA: Insufficient documentation

## 2019-06-29 DIAGNOSIS — R4701 Aphasia: Secondary | ICD-10-CM | POA: Insufficient documentation

## 2019-06-29 HISTORY — PX: SPACE OAR INSTILLATION: SHX6769

## 2019-06-29 HISTORY — PX: RADIOACTIVE SEED IMPLANT: SHX5150

## 2019-06-29 HISTORY — PX: CYSTOSCOPY: SHX5120

## 2019-06-29 SURGERY — INSERTION, RADIATION SOURCE, PROSTATE
Anesthesia: General | Site: Rectum

## 2019-06-29 MED ORDER — DEXAMETHASONE SODIUM PHOSPHATE 10 MG/ML IJ SOLN
INTRAMUSCULAR | Status: AC
  Filled 2019-06-29: qty 1

## 2019-06-29 MED ORDER — ONDANSETRON HCL 4 MG/2ML IJ SOLN
INTRAMUSCULAR | Status: AC
  Filled 2019-06-29: qty 2

## 2019-06-29 MED ORDER — FENTANYL CITRATE (PF) 100 MCG/2ML IJ SOLN
INTRAMUSCULAR | Status: AC
  Filled 2019-06-29: qty 2

## 2019-06-29 MED ORDER — LACTATED RINGERS IV SOLN
INTRAVENOUS | Status: DC
  Administered 2019-06-29 (×2): via INTRAVENOUS
  Filled 2019-06-29: qty 1000

## 2019-06-29 MED ORDER — LIDOCAINE HCL (CARDIAC) PF 100 MG/5ML IV SOSY
PREFILLED_SYRINGE | INTRAVENOUS | Status: DC | PRN
  Administered 2019-06-29: 70 mg via INTRAVENOUS

## 2019-06-29 MED ORDER — EPHEDRINE 5 MG/ML INJ
INTRAVENOUS | Status: AC
  Filled 2019-06-29: qty 10

## 2019-06-29 MED ORDER — TAMSULOSIN HCL 0.4 MG PO CAPS: 0.4000 mg | ORAL_CAPSULE | Freq: Every evening | ORAL | 1 refills | Status: DC | PRN

## 2019-06-29 MED ORDER — OXYCODONE HCL 5 MG PO TABS
5.0000 mg | ORAL_TABLET | Freq: Once | ORAL | Status: DC | PRN
  Filled 2019-06-29: qty 1

## 2019-06-29 MED ORDER — CIPROFLOXACIN IN D5W 400 MG/200ML IV SOLN
400.0000 mg | INTRAVENOUS | Status: AC
  Administered 2019-06-29: 400 mg via INTRAVENOUS
  Filled 2019-06-29: qty 200

## 2019-06-29 MED ORDER — STERILE WATER FOR INJECTION IJ SOLN
INTRAMUSCULAR | Status: DC | PRN
  Administered 2019-06-29: 09:00:00 3 mL

## 2019-06-29 MED ORDER — FENTANYL CITRATE (PF) 100 MCG/2ML IJ SOLN
INTRAMUSCULAR | Status: DC | PRN
  Administered 2019-06-29 (×2): 25 ug via INTRAVENOUS
  Administered 2019-06-29: 50 ug via INTRAVENOUS

## 2019-06-29 MED ORDER — MEPERIDINE HCL 25 MG/ML IJ SOLN
6.2500 mg | INTRAMUSCULAR | Status: DC | PRN
  Filled 2019-06-29: qty 1

## 2019-06-29 MED ORDER — DEXAMETHASONE SODIUM PHOSPHATE 10 MG/ML IJ SOLN
INTRAMUSCULAR | Status: DC | PRN
  Administered 2019-06-29: 5 mg via INTRAVENOUS

## 2019-06-29 MED ORDER — IOHEXOL 300 MG/ML  SOLN
INTRAMUSCULAR | Status: DC | PRN
  Administered 2019-06-29: 09:00:00 7 mL

## 2019-06-29 MED ORDER — ACETAMINOPHEN 325 MG PO TABS
325.0000 mg | ORAL_TABLET | ORAL | Status: DC | PRN
  Filled 2019-06-29: qty 2

## 2019-06-29 MED ORDER — LIDOCAINE 2% (20 MG/ML) 5 ML SYRINGE
INTRAMUSCULAR | Status: AC
  Filled 2019-06-29: qty 5

## 2019-06-29 MED ORDER — PROPOFOL 10 MG/ML IV BOLUS
INTRAVENOUS | Status: DC | PRN
  Administered 2019-06-29: 20 mg via INTRAVENOUS
  Administered 2019-06-29: 130 mg via INTRAVENOUS

## 2019-06-29 MED ORDER — EPHEDRINE SULFATE-NACL 50-0.9 MG/10ML-% IV SOSY
PREFILLED_SYRINGE | INTRAVENOUS | Status: DC | PRN
  Administered 2019-06-29 (×4): 5 mg via INTRAVENOUS

## 2019-06-29 MED ORDER — ACETAMINOPHEN 160 MG/5ML PO SOLN
325.0000 mg | ORAL | Status: DC | PRN
  Filled 2019-06-29: qty 20.3

## 2019-06-29 MED ORDER — SENNOSIDES-DOCUSATE SODIUM 8.6-50 MG PO TABS: 1.0000 | ORAL_TABLET | Freq: Every day | ORAL | 0 refills | Status: DC

## 2019-06-29 MED ORDER — OXYCODONE HCL 5 MG/5ML PO SOLN
5.0000 mg | Freq: Once | ORAL | Status: DC | PRN
  Filled 2019-06-29: qty 5

## 2019-06-29 MED ORDER — FENTANYL CITRATE (PF) 100 MCG/2ML IJ SOLN
25.0000 ug | INTRAMUSCULAR | Status: DC | PRN
  Filled 2019-06-29: qty 1

## 2019-06-29 MED ORDER — SODIUM CHLORIDE 0.9 % IR SOLN
Status: DC | PRN
  Administered 2019-06-29: 1000 mL via INTRAVESICAL

## 2019-06-29 MED ORDER — FLEET ENEMA 7-19 GM/118ML RE ENEM
1.0000 | ENEMA | Freq: Once | RECTAL | Status: AC
  Administered 2019-06-29: 05:00:00 1 via RECTAL
  Filled 2019-06-29: qty 1

## 2019-06-29 MED ORDER — TRAMADOL HCL 50 MG PO TABS: 50.0000 mg | ORAL_TABLET | Freq: Four times a day (QID) | ORAL | 0 refills | Status: AC | PRN

## 2019-06-29 MED ORDER — SODIUM CHLORIDE (PF) 0.9 % IJ SOLN
INTRAMUSCULAR | Status: DC | PRN
  Administered 2019-06-29: 10 mL

## 2019-06-29 MED ORDER — ONDANSETRON HCL 4 MG/2ML IJ SOLN
INTRAMUSCULAR | Status: DC | PRN
  Administered 2019-06-29: 4 mg via INTRAVENOUS

## 2019-06-29 MED ORDER — PROPOFOL 10 MG/ML IV BOLUS
INTRAVENOUS | Status: AC
  Filled 2019-06-29: qty 20

## 2019-06-29 MED ORDER — CIPROFLOXACIN IN D5W 400 MG/200ML IV SOLN
INTRAVENOUS | Status: AC
  Filled 2019-06-29: qty 200

## 2019-06-29 MED ORDER — ONDANSETRON HCL 4 MG/2ML IJ SOLN
4.0000 mg | Freq: Once | INTRAMUSCULAR | Status: DC | PRN
  Filled 2019-06-29: qty 2

## 2019-06-29 SURGICAL SUPPLY — 43 items
BAG URINE DRAINAGE (UROLOGICAL SUPPLIES) ×4 IMPLANT
BLADE CLIPPER SENSICLIP SURGIC (BLADE) ×4 IMPLANT
CATH FOLEY 2WAY SLVR  5CC 16FR (CATHETERS) ×1
CATH FOLEY 2WAY SLVR 5CC 16FR (CATHETERS) ×3 IMPLANT
CATH ROBINSON RED A/P 16FR (CATHETERS) IMPLANT
CATH ROBINSON RED A/P 20FR (CATHETERS) ×4 IMPLANT
CLOTH BEACON ORANGE TIMEOUT ST (SAFETY) ×4 IMPLANT
CONT SPECI 4OZ STER CLIK (MISCELLANEOUS) ×8 IMPLANT
COVER BACK TABLE 60X90IN (DRAPES) ×4 IMPLANT
COVER MAYO STAND STRL (DRAPES) ×4 IMPLANT
COVER WAND RF STERILE (DRAPES) IMPLANT
DRSG TEGADERM 4X4.75 (GAUZE/BANDAGES/DRESSINGS) ×4 IMPLANT
DRSG TEGADERM 8X12 (GAUZE/BANDAGES/DRESSINGS) ×4 IMPLANT
GAUZE SPONGE 4X4 12PLY STRL LF (GAUZE/BANDAGES/DRESSINGS) ×4 IMPLANT
GLOVE BIO SURGEON STRL SZ 6 (GLOVE) IMPLANT
GLOVE BIO SURGEON STRL SZ 6.5 (GLOVE) IMPLANT
GLOVE BIO SURGEON STRL SZ7 (GLOVE) IMPLANT
GLOVE BIO SURGEON STRL SZ7.5 (GLOVE) ×12 IMPLANT
GLOVE BIO SURGEON STRL SZ8 (GLOVE) IMPLANT
GLOVE BIOGEL PI IND STRL 6 (GLOVE) IMPLANT
GLOVE BIOGEL PI IND STRL 6.5 (GLOVE) IMPLANT
GLOVE BIOGEL PI IND STRL 7.0 (GLOVE) IMPLANT
GLOVE BIOGEL PI IND STRL 8 (GLOVE) IMPLANT
GLOVE BIOGEL PI INDICATOR 6 (GLOVE)
GLOVE BIOGEL PI INDICATOR 6.5 (GLOVE)
GLOVE BIOGEL PI INDICATOR 7.0 (GLOVE)
GLOVE BIOGEL PI INDICATOR 8 (GLOVE)
GLOVE SURG ORTHO 8.5 STRL (GLOVE) ×12 IMPLANT
GOWN STRL REUS W/TWL LRG LVL3 (GOWN DISPOSABLE) ×12 IMPLANT
GOWN STRL REUS W/TWL XL LVL3 (GOWN DISPOSABLE) ×4 IMPLANT
HOLDER FOLEY CATH W/STRAP (MISCELLANEOUS) IMPLANT
I-Seed AgX100 ×212 IMPLANT
IMPL SPACEOAR SYSTEM 10ML (Spacer) ×3 IMPLANT
IMPLANT SPACEOAR SYSTEM 10ML (Spacer) ×4 IMPLANT
IV SOD CHL 0.9% 1000ML (IV SOLUTION) ×4 IMPLANT
KIT TURNOVER CYSTO (KITS) ×4 IMPLANT
MARKER SKIN DUAL TIP RULER LAB (MISCELLANEOUS) ×4 IMPLANT
PACK CYSTO (CUSTOM PROCEDURE TRAY) ×4 IMPLANT
SURGILUBE 2OZ TUBE FLIPTOP (MISCELLANEOUS) ×4 IMPLANT
SUT BONE WAX W31G (SUTURE) IMPLANT
SYR 10ML LL (SYRINGE) ×4 IMPLANT
UNDERPAD 30X30 (UNDERPADS AND DIAPERS) ×8 IMPLANT
WATER STERILE IRR 500ML POUR (IV SOLUTION) ×4 IMPLANT

## 2019-06-29 NOTE — Anesthesia Preprocedure Evaluation (Signed)
Anesthesia Evaluation  Patient identified by MRN, date of birth, ID band Patient awake    Reviewed: Allergy & Precautions, H&P , NPO status , Patient's Chart, lab work & pertinent test results, reviewed documented beta blocker date and time   Airway Mallampati: II  TM Distance: >3 FB Neck ROM: full    Dental no notable dental hx.    Pulmonary sleep apnea and Continuous Positive Airway Pressure Ventilation ,    Pulmonary exam normal breath sounds clear to auscultation       Cardiovascular Exercise Tolerance: Good hypertension, Pt. on medications negative cardio ROS   Rhythm:regular Rate:Normal     Neuro/Psych TBI (traumatic brain injury) 2015  MCI (mild cognitive impairment) Aphasia due to closed TBI  Gait instability   negative psych ROS   GI/Hepatic negative GI ROS, Neg liver ROS,   Endo/Other  negative endocrine ROS  Renal/GU negative Renal ROS  negative genitourinary   Musculoskeletal   Abdominal   Peds  Hematology negative hematology ROS (+)   Anesthesia Other Findings   Reproductive/Obstetrics negative OB ROS                             Anesthesia Physical Anesthesia Plan  ASA: III  Anesthesia Plan: General   Post-op Pain Management:    Induction: Intravenous  PONV Risk Score and Plan: 2 and Ondansetron, Dexamethasone and Treatment may vary due to age or medical condition  Airway Management Planned: Oral ETT and LMA  Additional Equipment:   Intra-op Plan:   Post-operative Plan: Extubation in OR  Informed Consent: I have reviewed the patients History and Physical, chart, labs and discussed the procedure including the risks, benefits and alternatives for the proposed anesthesia with the patient or authorized representative who has indicated his/her understanding and acceptance.     Dental Advisory Given  Plan Discussed with: CRNA, Anesthesiologist and  Surgeon  Anesthesia Plan Comments: (  )        Anesthesia Quick Evaluation

## 2019-06-29 NOTE — Anesthesia Procedure Notes (Signed)
Procedure Name: LMA Insertion Date/Time: 06/29/2019 8:32 AM Performed by: Raenette Rover, CRNA Pre-anesthesia Checklist: Patient identified, Emergency Drugs available, Suction available and Patient being monitored Patient Re-evaluated:Patient Re-evaluated prior to induction Oxygen Delivery Method: Circle system utilized Preoxygenation: Pre-oxygenation with 100% oxygen Induction Type: IV induction LMA: LMA inserted LMA Size: 4.0 Number of attempts: 1 Placement Confirmation: positive ETCO2,  CO2 detector and breath sounds checked- equal and bilateral Tube secured with: Tape Dental Injury: Teeth and Oropharynx as per pre-operative assessment
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
## 2019-06-29 NOTE — Progress Notes (Signed)
  Radiation Oncology         [phone] ________________________________  Name: Cody Gonzalez MRN: [id number]  Date: 06/29/2019  DOB: 1945-11-28       Prostate Seed Implant  [id number], Beryle Lathe, FNP  No ref. provider found  DIAGNOSIS: 73 y.o. gentleman with Stage T1c adenocarcinoma of the prostate with Gleason score of 4+4 and PSA of 9.06.    ICD-10-CM   1. Preop testing  Z01.818 DG Chest 2 View    DG Chest 2 View    PROCEDURE: Insertion of radioactive I-125 seeds into the prostate gland.  RADIATION DOSE: 110 Gy, definitive therapy.  TECHNIQUE: Cody Gonzalez was brought to the operating room with the urologist. He was placed in the dorsolithotomy position. He was catheterized and a rectal tube was inserted. The perineum was shaved, prepped and draped. The ultrasound probe was then introduced into the rectum to see the prostate gland.  TREATMENT DEVICE: A needle grid was attached to the ultrasound probe stand and anchor needles were placed.  3D PLANNING: The prostate was imaged in 3D using a sagittal sweep of the prostate probe. These images were transferred to the planning computer. There, the prostate, urethra and rectum were defined on each axial reconstructed image. Then, the software created an optimized 3D plan and a few seed positions were adjusted. The quality of the plan was reviewed using [facility] information for the target and the following two organs at risk:  Urethra and Rectum.  Then the accepted plan was printed and handed off to the radiation therapist.  Under my supervision, the custom loading of the seeds and spacers was carried out and loaded into sealed vicryl sleeves.  These pre-loaded needles were then placed into the needle holder.Marland Kitchen  PROSTATE VOLUME STUDY:  Using transrectal ultrasound the volume of the prostate was verified to be 20.3 cc.  SPECIAL TREATMENT PROCEDURE/SUPERVISION AND HANDLING: The pre-loaded needles were then delivered under sagittal  guidance. A total of 20 needles were used to deposit 53 seeds in the prostate gland. The individual seed activity was 0.304 mCi.  SpaceOAR:  Yes  COMPLEX SIMULATION: At the end of the procedure, an anterior radiograph of the pelvis was obtained to document seed positioning and count. Cystoscopy was performed to check the urethra and bladder.  MICRODOSIMETRY: At the end of the procedure, the patient was emitting 0.05 mR/hr at 1 meter. Accordingly, he was considered safe for hospital discharge.  PLAN: The patient will return to the radiation oncology clinic for post implant CT dosimetry in three weeks.   ________________________________  Sheral Apley Tammi Klippel, M.D.

## 2019-06-29 NOTE — Op Note (Signed)
NAMERAMEL, RODELA MEDICAL RECORD [id number] ACCOUNT [id number] DATE OF BIRTH:09-04-1946 FACILITY: [facility] LOCATION: [facility] PHYSICIAN:Cassady Stanczak, MD  OPERATIVE REPORT  DATE OF PROCEDURE:  06/29/2019  PREOPERATIVE DIAGNOSIS:  High-risk adenocarcinoma of the prostate.  PROCEDURE: 1.  Prostate brachytherapy seed implantation. 2.  SpaceOAR perirectal gel matrix instillation. 3.  Cystoscopy.  ESTIMATED BLOOD LOSS:  Nil.  COMPLICATIONS:  None.  SPECIMENS:  None.  FINDINGS: 1.  Very modest trilobar prostatic hypertrophy.  No evidence of intraluminal seeds with cystoscopy. 2.  Successful placement of 53 brachytherapy seeds.  RADIATION PARAMETERS:  110 Gy over 21 catheters dispensing 53 prostate seeds.  INDICATIONS:  The patient is a 73 year old quite vigorous man who was found on workup of elevated and rising PSA to have multifocal high-risk adenocarcinoma of the prostate.  This is clinically localized based on staging imaging.  Options were discussed  for management including surveillance protocols versus ablative therapy versus surgical extirpation.  The patient was to proceed with multimodal radiation with 2 years of androgen deprivation.  He received LHRH agonist approximately 2 months ago and now  presents for prostate brachytherapy seed implantation as primary energy source to his prostate.  Informed consent was then placed in the medical record.  PROCEDURE IN DETAIL:  The patient being identified, the procedure being prostate brachytherapy seed implantation was confirmed.  Procedure time-out was performed.  Intravenous antibiotics administered.  General anesthesia induced.  The patient was placed  into a medium lithotomy position.  A sterile field was created.  Prepped and draped the base of the penis, proximal thighs, and perineum using iodine.  [place] catheter was placed free to straight drain with contrast within the catheter.  Transrectal  ultrasound was then  performed as per radiation oncology note.  Volumetric analysis was performed with the prescribed dose.  Plan formulated.  Then, 21 separate catheters were used to deploy 53 iodine I-125 seeds as per prescribed dose from an anterior to  posterior orientation.  [place] catheter was then removed and cystourethroscopy was performed using a 15-French flexible cystoscope.  Inspection of anterior and posterior urethra was unremarkable.  Inspection of bladder revealed no diverticula,  calcifications, or papillary lesions.  There was some modest trilobar prostatic hypertrophy.  There is no evidence of intraluminal seeds whatsoever.  Retroflexion revealed no additional findings.  The scope was then removed.  Next, the SpaceOAR finder  needle was introduced in the perineum into the perirectal plane as verified by the prerectal fat stripe corresponding to the mid gland, mid prostate.  A test injection of 2 mL of saline was performed which corroborated proper plane.  Next, the SpaceOAR  gel matrix was instilled as per manufacturer's guidelines over 10 seconds which resulted in excellent displacement of the anterior rectal wall posteriorly, away from the prostate.  Procedure was then terminated.  The patient tolerated the procedure well.   There were no immediate perioperative complications.  The patient was taken to postanesthesia care in stable condition with plan for a trial of void and discharge home.  LN/NUANCE  D:06/29/2019 T:06/29/2019 JOB:[id number]

## 2019-06-29 NOTE — Transfer of Care (Signed)
Immediate Anesthesia Transfer of Care Note  Patient: Windell Hummingbird  Procedure(s) Performed: RADIOACTIVE SEED IMPLANT/BRACHYTHERAPY IMPLANT (N/A Prostate) SPACE OAR INSTILLATION (N/A Rectum) CYSTOSCOPY (N/A Bladder)  Patient Location: PACU  Anesthesia Type:General  Level of Consciousness: awake, alert , oriented, drowsy and patient cooperative  Airway & Oxygen Therapy: Patient Spontanous Breathing and Patient connected to nasal cannula oxygen  Post-op Assessment: Report given to RN and Post -op Vital signs reviewed and stable  Post vital signs: Reviewed and stable  Last Vitals:  Vitals Value Taken Time  BP 118/66 06/29/19 0940  Temp    Pulse 57 06/29/19 0942  Resp 17 06/29/19 0942  SpO2 98 % 06/29/19 0942  Vitals shown include unvalidated device data.  Last Pain:  Vitals:   06/29/19 0705  TempSrc:   PainSc: 0-No pain      Patients Stated Pain Goal: 7 ([id number])  Complications: No apparent anesthesia complications

## 2019-06-29 NOTE — Discharge Instructions (Signed)
°  Post Anesthesia Home Care Instructions  Activity: Get plenty of rest for the remainder of the day. A responsible adult should stay with you for 24 hours following the procedure.  For the next 24 hours, DO NOT: -Drive a car -Paediatric nurse -Drink alcoholic beverages -Take any medication unless instructed by your physician -Make any legal decisions or sign important papers.  Meals: Start with liquid foods such as gelatin or soup. Progress to regular foods as tolerated. Avoid greasy, spicy, heavy foods. If nausea and/or vomiting occur, drink only clear liquids until the nausea and/or vomiting subsides. Call your physician if vomiting continues.  Special Instructions/Symptoms: Your throat may feel dry or sore from the anesthesia or the breathing tube placed in your throat during surgery. If this causes discomfort, gargle with warm salt water. The discomfort should disappear within 24 hours.  If you had a scopolamine patch placed behind your ear for the management of post- operative nausea and/or vomiting:  1. The medication in the patch is effective for 72 hours, after which it should be removed.  Wrap patch in a tissue and discard in the trash. Wash hands thoroughly with soap and water. 2. You may remove the patch earlier than 72 hours if you experience unpleasant side effects which may include dry mouth, dizziness or visual disturbances. 3. Avoid touching the patch. Wash your hands with soap and water after contact with the patch.   Radioactive Seed Implant Home Care Instructions   Activity:    Rest for the remainder of the day.  Do not drive or operate equipment today.  You may resume normal  activities in a few days as instructed by your physician, without risk of harmful radiation exposure to those around you, provided you follow the time and distance precautions on the Radiation Oncology Instruction Sheet.   Meals: Drink plenty of lipuids and eat light foods, such as gelatin or  soup this evening .  You may return to normal meal plan tomorrow.  Return To Work: You may return to work as instructed by Naval architect.  Special Instruction:   If any seeds are found, use tweezers to pick up seeds and place in a glass container of any kind and bring to your physician's office.  Call your physician if any of these symptoms occur:   Persistent or heavy bleeding  Urine stream diminishes or stops completely after catheter is removed  Fever equal to or greater than 101 degrees F  Cloudy urine with a strong foul odor  Severe pain  You may feel some burning pain and/or hesitancy when you urinate after the catheter is removed.  These symptoms may increase over the next few weeks, but should diminish within forur to six weeks.  Applying moist heat to the lower abdomen or a hot tub bath may help relieve the pain.  If the discomfort becomes severe, please call your physician for additional medications.  1 - You may have urinary urgency (bladder spasms) and bloody urine on / off for up to 2 weeks. This is normal.  2 - Call MD or go to ER for fever >102, severe pain / nausea / vomiting not relieved by medications, or acute change in medical status

## 2019-06-29 NOTE — Brief Op Note (Signed)
06/29/2019  9:33 AM  PATIENT:  Cody Gonzalez  73 y.o. male  PRE-OPERATIVE DIAGNOSIS:  PROSTATE CANCER  POST-OPERATIVE DIAGNOSIS:  PROSTATE CANCER  PROCEDURE:  Procedure(s) with comments: RADIOACTIVE SEED IMPLANT/BRACHYTHERAPY IMPLANT (N/A) - 90 MINS SPACE OAR INSTILLATION (N/A) CYSTOSCOPY (N/A)  SURGEON:  Surgeon(s) and Role:    * Alexis Frock, MD - Primary    * Tyler Pita, MD  PHYSICIAN ASSISTANT:   ASSISTANTS: none   ANESTHESIA:   general  EBL:  minimal   BLOOD ADMINISTERED:none  DRAINS: none   LOCAL MEDICATIONS USED:  NONE  SPECIMEN:  No Specimen  DISPOSITION OF SPECIMEN:  N/A  COUNTS:  YES  TOURNIQUET:  * No tourniquets in log *  DICTATION: .Other Dictation: Dictation Number [id number]  PLAN OF CARE: Discharge to home after PACU  PATIENT DISPOSITION:  PACU - hemodynamically stable.   Delay start of Pharmacological VTE agent (>24hrs) due to surgical blood loss or risk of bleeding: yes

## 2019-06-30 ENCOUNTER — Encounter (HOSPITAL_BASED_OUTPATIENT_CLINIC_OR_DEPARTMENT_OTHER): Payer: Self-pay | Admitting: Urology

## 2019-06-30 NOTE — Anesthesia Postprocedure Evaluation (Signed)
Anesthesia Post Note  Patient: Nature conservation officer  Procedure(s) Performed: RADIOACTIVE SEED IMPLANT/BRACHYTHERAPY IMPLANT (N/A Prostate) SPACE OAR INSTILLATION (N/A Rectum) CYSTOSCOPY (N/A Bladder)     Patient location during evaluation: PACU Anesthesia Type: General Level of consciousness: awake and alert Pain management: pain level controlled Vital Signs Assessment: post-procedure vital signs reviewed and stable Respiratory status: spontaneous breathing, nonlabored ventilation, respiratory function stable and patient connected to nasal cannula oxygen Cardiovascular status: blood pressure returned to baseline and stable Postop Assessment: no apparent nausea or vomiting Anesthetic complications: no    Last Vitals:  Vitals:   06/29/19 1020 06/29/19 1115  BP:  (!) 179/78  Pulse: 64 66  Resp: 15 14  Temp: (!) 36.3 C 36.6 C  SpO2: 100% 99%    Last Pain:  Vitals:   06/29/19 1100  TempSrc:   PainSc: 0-No pain                 Cody Gonzalez

## 2019-07-04 ENCOUNTER — Encounter: Payer: Self-pay | Admitting: Adult Health

## 2019-07-04 ENCOUNTER — Ambulatory Visit (INDEPENDENT_AMBULATORY_CARE_PROVIDER_SITE_OTHER): Payer: Medicare Other | Admitting: Adult Health

## 2019-07-04 ENCOUNTER — Other Ambulatory Visit: Payer: Self-pay

## 2019-07-04 VITALS — BP 178/77 | HR 66 | Temp 97.5°F | Ht 67.0 in | Wt 161.8 lb

## 2019-07-04 DIAGNOSIS — G4733 Obstructive sleep apnea (adult) (pediatric): Secondary | ICD-10-CM

## 2019-07-04 DIAGNOSIS — R413 Other amnesia: Secondary | ICD-10-CM

## 2019-07-04 DIAGNOSIS — Z9989 Dependence on other enabling machines and devices: Secondary | ICD-10-CM | POA: Diagnosis not present

## 2019-07-04 NOTE — Progress Notes (Signed)
PATIENT: Linard Daft DOB: 1946/03/26  REASON FOR VISIT: follow up HISTORY FROM: patient  HISTORY OF PRESENT ILLNESS: Today 07/04/19:  Mr. Passey is a 73 year old male with a history of obstructive sleep apnea on CPAP and memory disturbance.  He returns today for evaluation of his memory.  Overall he feels that his memory is stable.  He states that sometimes he draws a blank but after several minutes he is able to recall what he needs to.  He denies any reports by his family or spouse of memory trouble.  He lives at home with his spouse.  He is able to complete all ADLs independently.  His wife handles the finances.  He reports that she is always done this.  He operates a Teacher, music without difficulty.  He reports that sometimes he has decreased confidence with directions but is always going the right way.  He does help with meals.  Reports that he does bake bread frequently.  He is able to remember recipes.  Overall he feels that he is doing well.  He returns today for an evaluation.   HISTORY (Copied from Dr.Dohmeier's note) 03/15/19: this visit is mainly dedicated to assure CPAP therapy compliance.  The patient used CPAP 100% for the last 30 days, ending 03-09-2019, average of 8 h and 22 min .  Mr. Berwick and his wife confirmed that he does not take daytime naps, he may still have 2 sometimes 3 bathroom breaks at night and he is still consuming 1 or 2 beers at night.  He feels that his headaches continue to improve gradually maybe that is a coincidence and not CPAP related, but overall he feels alert.  He told me that he was never multitasking to begin with.  AHI remains 10.6/h., but sleep quality is good, sleep time is over 7 hours. We decided not to adjust any further at this point.   The patient mentioned several other concerns, including cognitive decline - he was supposed to have a face to face visit for MOCA/ MMSE in the office, we have postponed tis to  August,  memory testing can be done with NP.  The patient repeated some statements during this 25-minute video visit   Mr. Cutting also stated that he has a shuffling gait at night and that his wife and he himself participate in exercises currently on resume, his tai chi instructions to help with balance and limb balance.  He continues to bike and has good balance on the bicycle.  I asked him to walk in front of the camera and his gait did not show any shuffling, a good step with, turning this 2-1/2 steps no evidence of foot drop or limp.  He did not hold onto furniture or walls.  I wonder if the shuffling happens at night out of an insecurity when he cannot see the ground very well.  He also states that his prostate cancer appointment is coming up he sees Dr. Tresa Moore, his urologist and Dr. Tammi Klippel-  he has decided that he will have a prostatectomy instead of  radiation therapy.  REVIEW OF SYSTEMS: Out of a complete 14 system review of symptoms, the patient complains only of the following symptoms, and all other reviewed systems are negative.  See HPI  ALLERGIES: No Known Allergies  HOME MEDICATIONS: Outpatient Medications Prior to Visit  Medication Sig Dispense Refill  . amLODipine (NORVASC) 5 MG tablet Take 5 mg by mouth daily.    Marland Kitchen lisinopril (PRINIVIL,ZESTRIL) 2.5 MG  tablet Take 2.5 mg by mouth daily.    Marland Kitchen senna-docusate (SENOKOT-S) 8.6-50 MG tablet Take 1 tablet by mouth daily. While taking strong pain meds to prevent constipation 20 tablet 0  . tamsulosin (FLOMAX) 0.4 MG CAPS capsule Take 1 capsule (0.4 mg total) by mouth at bedtime as needed. For urinary urgency / frequency after prostate radiation 30 capsule 1  . traMADol (ULTRAM) 50 MG tablet Take 1-2 tablets (50-100 mg total) by mouth every 6 (six) hours as needed for moderate pain or severe pain. Post-operatively 15 tablet 0   No facility-administered medications prior to visit.     PAST MEDICAL HISTORY: Past Medical History:   Diagnosis Date  . Hypertension   . Prostate cancer ([place])   . Sleep apnea    uses cpap   . TBI (traumatic brain injury) ([place]) 2015    PAST SURGICAL HISTORY: Past Surgical History:  Procedure Laterality Date  . BILATERAL CARPAL TUNNEL RELEASE  2014  . CYSTOSCOPY N/A 06/29/2019   Procedure: CYSTOSCOPY;  Surgeon: Alexis Frock, MD;  Location[facility];  Service: Urology;  Laterality: N/A;  No seeds in bladder per Dr. Tresa Moore  . PROSTATE BIOPSY    . RADIOACTIVE SEED IMPLANT N/A 06/29/2019   Procedure: RADIOACTIVE SEED IMPLANT/BRACHYTHERAPY IMPLANT;  Surgeon: Alexis Frock, MD;  Location: [facility];  Service: Urology;  Laterality: N/A;  90 MINS  . SPACE OAR INSTILLATION N/A 06/29/2019   Procedure: SPACE OAR INSTILLATION;  Surgeon: Alexis Frock, MD;  Location[facility]  Service: Urology;  Laterality: N/A;    FAMILY HISTORY: Family History  Problem Relation Age of Onset  . Brain cancer Father        GBM    SOCIAL HISTORY: Social History   Socioeconomic History  . Marital status: Married    Spouse name: Not on file  . Number of children: Not on file  . Years of education: Not on file  . Highest education level: Not on file  Occupational History  . 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 Financial resource strain: Not on file  . Food insecurity    Worry: Not on file    Inability: Not on file  . Transportation needs    Medical: Not on file    Non-medical: Not on file  Tobacco Use  . Smoking status: Never Smoker  . Smokeless tobacco: Never Used  Substance and Sexual Activity  . Alcohol use: Not Currently    Frequency: Never    Comment: nonalcoholic beer only  . Drug use: No  . Sexual activity: Yes    Comment: with aid of sildenafil  Lifestyle  . Physical activity    Days per week: Not on file    Minutes per session: Not on file  . Stress: Not on file  Relationships  . Social Product manager on phone: Not on file    Gets together: Not on file    Attends religious service: Not on file    Active member of club or organization: Not on file    Attends meetings of clubs or organizations: Not on file    Relationship status: Not on file  . Intimate partner violence    Fear of current or ex partner: Not on file    Emotionally abused: Not on file    Physically abused: Not on file    Forced sexual activity: Not on file  Other Topics Concern  .  Not on file  Social History Narrative   Married. 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 Preferred name: Rod. Enjoys cycling.      PHYSICAL EXAM  Vitals:   07/04/19 1254  BP: (!) 178/77  Pulse: 66  Temp: (!) 97.5 F (36.4 C)  Weight: 161 lb 12.8 oz (73.4 kg)  Height: '5\' 7"'$  (1.702 m)   Body mass index is 25.34 kg/m.   Montreal Cognitive Assessment  07/04/2019  Visuospatial/ Executive (0/5) 5  Naming (0/3) 3  Attention: Read list of digits (0/2) 1  Attention: Read list of letters (0/1) 1  Attention: Serial 7 subtraction starting at 100 (0/3) 3  Language: Repeat phrase (0/2) 2  Language : Fluency (0/1) 1  Abstraction (0/2) 2  Delayed Recall (0/5) 1  Orientation (0/6) 5  Total 24     Generalized: Well developed, in no acute distress   Neurological examination  Mentation: Alert oriented to time, place, history taking. Follows all commands speech and language fluent Cranial nerve II-XII: Pupils were equal round reactive to light. Extraocular movements were full, visual field were full on confrontational test. Head turning and shoulder shrug  were normal and symmetric. Motor: The motor testing reveals 5 over 5 strength of all 4 extremities. Good symmetric motor tone is noted throughout.  Sensory: Sensory testing is intact to soft touch on all 4 extremities. No evidence of extinction is noted.  Coordination: Cerebellar testing reveals good finger-nose-finger and heel-to-shin  bilaterally.  Gait and station: Gait is normal.  Reflexes: Deep tendon reflexes are symmetric and normal bilaterally.   DIAGNOSTIC DATA (LABS, IMAGING, TESTING) - I reviewed patient records, labs, notes, testing and imaging myself where available.  Lab Results  Component Value Date   WBC 4.8 06/27/2019   HGB 13.4 06/27/2019   HCT 37.5 (L) 06/27/2019   MCV 92.6 06/27/2019   PLT 269 06/27/2019      Component Value Date/Time   NA 130 (L) 06/27/2019 1209   K 4.2 06/27/2019 1209   CL 93 (L) 06/27/2019 1209   CO2 26 06/27/2019 1209   GLUCOSE 106 (H) 06/27/2019 1209   BUN 18 06/27/2019 1209   CREATININE 0.80 06/27/2019 1209   CALCIUM 9.4 06/27/2019 1209   PROT 7.4 06/27/2019 1209   ALBUMIN 4.3 06/27/2019 1209   AST 25 06/27/2019 1209   ALT 29 06/27/2019 1209   ALKPHOS 53 06/27/2019 1209   BILITOT 1.1 06/27/2019 1209   GFRNONAA >60 06/27/2019 1209   GFRAA >60 06/27/2019 1209      ASSESSMENT AND PLAN 73 y.o. year old male  has a past medical history of Hypertension, Prostate cancer ([place]), Sleep apnea, and TBI (traumatic brain injury) ([place]) (2015). here with:  1.  Memory disturbance 2.  Obstructive sleep apnea on CPAP  The patient's memory score is MOCA 24/30.  He is denying any significant changes with his memory.  We will continue to monitor.  If we start to notice that the score is trending downward we will consider adding on medication and possible further testing with neuropsychological evaluation.  The patient is encouraged to continue using the CPAP nightly and greater than 4 hours each night.  He is advised that if his symptoms worsen or he develops new symptoms he should let us know.  He will follow-up in 6 months or sooner if needed.      Ward Givens, MSN, NP-C 07/04/2019, 1:13 PM [facility] [address]  [place], [place] [address] [phone]

## 2019-07-04 NOTE — Patient Instructions (Signed)
Your Plan:  Memory score is stable. We will continue to monitor  Continue CPAP therapy If your symptoms worsen or you develop new symptoms please let us know.    Thank you for coming to see [place] at [facility]. I hope we have been able to provide you high quality care today.  You may receive a patient satisfaction survey over the next few weeks. We would appreciate your feedback and comments so that we may continue to improve ourselves and the health of our patients.

## 2019-07-07 ENCOUNTER — Telehealth: Payer: Self-pay | Admitting: *Deleted

## 2019-07-07 NOTE — Telephone Encounter (Signed)
Returned patient's phone call, lvm for a return call 

## 2019-07-14 ENCOUNTER — Ambulatory Visit (HOSPITAL_COMMUNITY): Payer: Medicare Other

## 2019-07-20 ENCOUNTER — Telehealth: Payer: Self-pay | Admitting: *Deleted

## 2019-07-20 NOTE — Telephone Encounter (Signed)
CALLED PATIENT TO REMIND OF POST SEED APPTS. AND MRI FOR 07-21-19, SPOKE WITH PATIENT AND HE IS AWARE OF THESE APPTS.

## 2019-07-21 ENCOUNTER — Ambulatory Visit: Payer: Medicare Other | Admitting: Radiation Oncology

## 2019-07-21 ENCOUNTER — Ambulatory Visit
Admission: RE | Admit: 2019-07-21 | Discharge: 2019-07-21 | Disposition: A | Discharge status: Home / Self Care | Payer: Medicare Other | Source: Ambulatory Visit | Attending: Radiation Oncology | Admitting: Radiation Oncology

## 2019-07-21 ENCOUNTER — Ambulatory Visit: Payer: Medicare Other | Admitting: Urology

## 2019-07-21 ENCOUNTER — Ambulatory Visit (HOSPITAL_COMMUNITY)
Admission: RE | Admit: 2019-07-21 | Discharge: 2019-07-21 | Disposition: A | Discharge status: Home / Self Care | Payer: Medicare Other | Source: Ambulatory Visit | Attending: Urology | Admitting: Urology

## 2019-07-21 ENCOUNTER — Encounter: Payer: Self-pay | Admitting: Medical Oncology

## 2019-07-21 ENCOUNTER — Other Ambulatory Visit: Payer: Self-pay

## 2019-07-21 DIAGNOSIS — C61 Malignant neoplasm of prostate: Secondary | ICD-10-CM | POA: Diagnosis not present

## 2019-07-23 NOTE — Progress Notes (Signed)
  Radiation Oncology         [phone] ________________________________  Name: Cody Gonzalez MRN: [id number]  Date: 07/21/2019  DOB: 1945/11/06  SIMULATION AND TREATMENT PLANNING NOTE    ICD-10-CM   1. Malignant neoplasm of prostate ([place])  C61     DIAGNOSIS:  73 y.o. gentleman with Stage T1c adenocarcinoma of the prostate with Gleason score of 4+4 and PSA of 9.06  NARRATIVE:  The patient was brought to the [facility].  Identity was confirmed.  All relevant records and images related to the planned course of therapy were reviewed.  The patient freely provided informed written consent to proceed with treatment after reviewing the details related to the planned course of therapy. The consent form was witnessed and verified by the simulation staff.  Then, the patient was set-up in a stable reproducible supine position for radiation therapy.  A vacuum lock pillow device was custom fabricated to position his legs in a reproducible immobilized position.  Then, I performed a urethrogram under sterile conditions to identify the prostatic apex.  CT images were obtained.  Surface markings were placed.  The CT images were loaded into the planning software.  Then the prostate target and avoidance structures including the rectum, bladder, bowel and hips were contoured.  Treatment planning then occurred.  The radiation prescription was entered and confirmed.  A total of one complex treatment devices were fabricated. I have requested : Intensity Modulated Radiotherapy (IMRT) is medically necessary for this case for the following reason:  Rectal sparing.Marland Kitchen  PLAN:  The patient will receive 45 Gy in 25 fractions of 1.8 Gy, to supplement an up-front prostate seed implant boost of 110 Gy to achieve a total nominal dose of 165 Gy.  ________________________________  Sheral Apley Tammi Klippel, M.D.

## 2019-07-23 NOTE — Progress Notes (Signed)
  Radiation Oncology         [phone]) [phone] ________________________________  Name: Cody Gonzalez MRN: [id number]  Date: 07/21/2019  DOB: 08-13-1946  COMPLEX SIMULATION NOTE  NARRATIVE:  The patient was brought to the [facility] today following prostate seed implantation approximately one month ago.  Identity was confirmed.  All relevant records and images related to the planned course of therapy were reviewed.  Then, the patient was set-up supine.  CT images were obtained.  The CT images were loaded into the planning software.  Then the prostate and rectum were contoured.  Treatment planning then occurred.  The implanted iodine 125 seeds were identified by the physics staff for projection of radiation distribution  I have requested : 3D Simulation  I have requested a DVH of the following structures: Prostate and rectum.    ________________________________  Sheral Apley Tammi Klippel, M.D.

## 2019-07-26 DIAGNOSIS — C61 Malignant neoplasm of prostate: Secondary | ICD-10-CM | POA: Diagnosis not present

## 2019-07-28 ENCOUNTER — Encounter: Payer: Self-pay | Admitting: Radiation Oncology

## 2019-07-28 DIAGNOSIS — C61 Malignant neoplasm of prostate: Secondary | ICD-10-CM | POA: Diagnosis not present

## 2019-07-31 NOTE — Progress Notes (Signed)
  Radiation Oncology         [phone]) [phone] ________________________________  Name: Cody Gonzalez MRN: [id number]  Date: 07/28/2019  DOB: 01/26/1946  3D Planning Note   Prostate Brachytherapy Post-Implant Dosimetry  Diagnosis: 73 y.o. gentleman with Stage T1c adenocarcinoma of the prostate with Gleason score of 4+4 and PSA of 9.06  Narrative: On a previous date, Cody Gonzalez returned following prostate seed implantation for post implant planning. He underwent CT scan complex simulation to delineate the three-dimensional structures of the pelvis and demonstrate the radiation distribution.  Since that time, the seed localization, and complex isodose planning with dose volume histograms have now been completed.  Results:   Prostate Coverage - The dose of radiation delivered to the 90% or more of the prostate gland (D90) was 103.67% of the prescription dose. This exceeds our goal of greater than 90%. Rectal Sparing - The volume of rectal tissue receiving the prescription dose or higher was 0.0 cc. This falls under our thresholds tolerance of 1.0 cc.  Impression: The prostate seed implant appears to show adequate target coverage and appropriate rectal sparing.  Plan:  The patient will continue to follow with urology for ongoing PSA determinations. I would anticipate a high likelihood for local tumor control with minimal risk for rectal morbidity.  ________________________________  Sheral Apley Tammi Klippel, M.D.

## 2019-08-01 ENCOUNTER — Other Ambulatory Visit: Payer: Self-pay

## 2019-08-01 ENCOUNTER — Ambulatory Visit
Admission: RE | Admit: 2019-08-01 | Discharge: 2019-08-01 | Disposition: A | Discharge status: Home / Self Care | Payer: Medicare Other | Source: Ambulatory Visit | Attending: Radiation Oncology | Admitting: Radiation Oncology

## 2019-08-01 DIAGNOSIS — C61 Malignant neoplasm of prostate: Secondary | ICD-10-CM | POA: Diagnosis not present

## 2019-08-02 ENCOUNTER — Other Ambulatory Visit: Payer: Self-pay

## 2019-08-02 ENCOUNTER — Ambulatory Visit
Admission: RE | Admit: 2019-08-02 | Discharge: 2019-08-02 | Disposition: A | Discharge status: Home / Self Care | Payer: Medicare Other | Source: Ambulatory Visit | Attending: Radiation Oncology | Admitting: Radiation Oncology

## 2019-08-02 DIAGNOSIS — C61 Malignant neoplasm of prostate: Secondary | ICD-10-CM | POA: Diagnosis not present

## 2019-08-03 ENCOUNTER — Other Ambulatory Visit: Payer: Self-pay

## 2019-08-03 ENCOUNTER — Ambulatory Visit
Admission: RE | Admit: 2019-08-03 | Discharge: 2019-08-03 | Disposition: A | Discharge status: Home / Self Care | Payer: Medicare Other | Source: Ambulatory Visit | Attending: Radiation Oncology | Admitting: Radiation Oncology

## 2019-08-03 DIAGNOSIS — C61 Malignant neoplasm of prostate: Secondary | ICD-10-CM | POA: Diagnosis not present

## 2019-08-04 ENCOUNTER — Other Ambulatory Visit: Payer: Self-pay

## 2019-08-04 ENCOUNTER — Ambulatory Visit
Admission: RE | Admit: 2019-08-04 | Discharge: 2019-08-04 | Disposition: A | Discharge status: Home / Self Care | Payer: Medicare Other | Source: Ambulatory Visit | Attending: Radiation Oncology | Admitting: Radiation Oncology

## 2019-08-04 DIAGNOSIS — C61 Malignant neoplasm of prostate: Secondary | ICD-10-CM | POA: Insufficient documentation

## 2019-08-05 ENCOUNTER — Other Ambulatory Visit: Payer: Self-pay

## 2019-08-05 ENCOUNTER — Ambulatory Visit
Admission: RE | Admit: 2019-08-05 | Discharge: 2019-08-05 | Disposition: A | Discharge status: Home / Self Care | Payer: Medicare Other | Source: Ambulatory Visit | Attending: Radiation Oncology | Admitting: Radiation Oncology

## 2019-08-05 DIAGNOSIS — C61 Malignant neoplasm of prostate: Secondary | ICD-10-CM | POA: Diagnosis not present

## 2019-08-08 ENCOUNTER — Ambulatory Visit
Admission: RE | Admit: 2019-08-08 | Discharge: 2019-08-08 | Disposition: A | Discharge status: Home / Self Care | Payer: Medicare Other | Source: Ambulatory Visit | Attending: Radiation Oncology | Admitting: Radiation Oncology

## 2019-08-08 ENCOUNTER — Other Ambulatory Visit: Payer: Self-pay

## 2019-08-08 DIAGNOSIS — C61 Malignant neoplasm of prostate: Secondary | ICD-10-CM | POA: Diagnosis not present

## 2019-08-09 ENCOUNTER — Other Ambulatory Visit: Payer: Self-pay

## 2019-08-09 ENCOUNTER — Ambulatory Visit
Admission: RE | Admit: 2019-08-09 | Discharge: 2019-08-09 | Disposition: A | Discharge status: Home / Self Care | Payer: Medicare Other | Source: Ambulatory Visit | Attending: Radiation Oncology | Admitting: Radiation Oncology

## 2019-08-09 DIAGNOSIS — C61 Malignant neoplasm of prostate: Secondary | ICD-10-CM | POA: Diagnosis not present

## 2019-08-10 ENCOUNTER — Ambulatory Visit
Admission: RE | Admit: 2019-08-10 | Discharge: 2019-08-10 | Disposition: A | Discharge status: Home / Self Care | Payer: Medicare Other | Source: Ambulatory Visit | Attending: Radiation Oncology | Admitting: Radiation Oncology

## 2019-08-10 ENCOUNTER — Other Ambulatory Visit: Payer: Self-pay

## 2019-08-10 DIAGNOSIS — C61 Malignant neoplasm of prostate: Secondary | ICD-10-CM | POA: Diagnosis not present

## 2019-08-11 ENCOUNTER — Ambulatory Visit
Admission: RE | Admit: 2019-08-11 | Discharge: 2019-08-11 | Disposition: A | Discharge status: Home / Self Care | Payer: Medicare Other | Source: Ambulatory Visit | Attending: Radiation Oncology | Admitting: Radiation Oncology

## 2019-08-11 ENCOUNTER — Other Ambulatory Visit: Payer: Self-pay

## 2019-08-11 DIAGNOSIS — C61 Malignant neoplasm of prostate: Secondary | ICD-10-CM | POA: Diagnosis not present

## 2019-08-12 ENCOUNTER — Ambulatory Visit
Admission: RE | Admit: 2019-08-12 | Discharge: 2019-08-12 | Disposition: A | Discharge status: Home / Self Care | Payer: Medicare Other | Source: Ambulatory Visit | Attending: Radiation Oncology | Admitting: Radiation Oncology

## 2019-08-12 ENCOUNTER — Other Ambulatory Visit: Payer: Self-pay

## 2019-08-12 DIAGNOSIS — C61 Malignant neoplasm of prostate: Secondary | ICD-10-CM | POA: Diagnosis not present

## 2019-08-15 ENCOUNTER — Ambulatory Visit
Admission: RE | Admit: 2019-08-15 | Discharge: 2019-08-15 | Disposition: A | Discharge status: Home / Self Care | Payer: Medicare Other | Source: Ambulatory Visit | Attending: Radiation Oncology | Admitting: Radiation Oncology

## 2019-08-15 ENCOUNTER — Other Ambulatory Visit: Payer: Self-pay

## 2019-08-15 DIAGNOSIS — C61 Malignant neoplasm of prostate: Secondary | ICD-10-CM | POA: Diagnosis not present

## 2019-08-16 ENCOUNTER — Other Ambulatory Visit: Payer: Self-pay

## 2019-08-16 ENCOUNTER — Ambulatory Visit
Admission: RE | Admit: 2019-08-16 | Discharge: 2019-08-16 | Disposition: A | Discharge status: Home / Self Care | Payer: Medicare Other | Source: Ambulatory Visit | Attending: Radiation Oncology | Admitting: Radiation Oncology

## 2019-08-16 DIAGNOSIS — C61 Malignant neoplasm of prostate: Secondary | ICD-10-CM | POA: Diagnosis not present

## 2019-08-17 ENCOUNTER — Ambulatory Visit: Payer: Medicare Other

## 2019-08-18 ENCOUNTER — Ambulatory Visit
Admission: RE | Admit: 2019-08-18 | Discharge: 2019-08-18 | Disposition: A | Discharge status: Home / Self Care | Payer: Medicare Other | Source: Ambulatory Visit | Attending: Radiation Oncology | Admitting: Radiation Oncology

## 2019-08-18 ENCOUNTER — Other Ambulatory Visit: Payer: Self-pay

## 2019-08-18 DIAGNOSIS — C61 Malignant neoplasm of prostate: Secondary | ICD-10-CM | POA: Diagnosis not present

## 2019-08-19 ENCOUNTER — Other Ambulatory Visit: Payer: Self-pay

## 2019-08-19 ENCOUNTER — Ambulatory Visit
Admission: RE | Admit: 2019-08-19 | Discharge: 2019-08-19 | Disposition: A | Discharge status: Home / Self Care | Payer: Medicare Other | Source: Ambulatory Visit | Attending: Radiation Oncology | Admitting: Radiation Oncology

## 2019-08-19 DIAGNOSIS — C61 Malignant neoplasm of prostate: Secondary | ICD-10-CM | POA: Diagnosis not present

## 2019-08-22 ENCOUNTER — Other Ambulatory Visit: Payer: Self-pay

## 2019-08-22 ENCOUNTER — Ambulatory Visit
Admission: RE | Admit: 2019-08-22 | Discharge: 2019-08-22 | Disposition: A | Discharge status: Home / Self Care | Payer: Medicare Other | Source: Ambulatory Visit | Attending: Radiation Oncology | Admitting: Radiation Oncology

## 2019-08-22 DIAGNOSIS — C61 Malignant neoplasm of prostate: Secondary | ICD-10-CM | POA: Diagnosis not present

## 2019-08-23 ENCOUNTER — Ambulatory Visit
Admission: RE | Admit: 2019-08-23 | Discharge: 2019-08-23 | Disposition: A | Discharge status: Home / Self Care | Payer: Medicare Other | Source: Ambulatory Visit | Attending: Radiation Oncology | Admitting: Radiation Oncology

## 2019-08-23 ENCOUNTER — Other Ambulatory Visit: Payer: Self-pay

## 2019-08-23 DIAGNOSIS — C61 Malignant neoplasm of prostate: Secondary | ICD-10-CM | POA: Diagnosis not present

## 2019-08-24 ENCOUNTER — Encounter: Payer: Self-pay | Admitting: Neurology

## 2019-08-24 ENCOUNTER — Other Ambulatory Visit: Payer: Self-pay

## 2019-08-24 ENCOUNTER — Ambulatory Visit
Admission: RE | Admit: 2019-08-24 | Discharge: 2019-08-24 | Disposition: A | Discharge status: Home / Self Care | Payer: Medicare Other | Source: Ambulatory Visit | Attending: Radiation Oncology | Admitting: Radiation Oncology

## 2019-08-24 DIAGNOSIS — C61 Malignant neoplasm of prostate: Secondary | ICD-10-CM | POA: Diagnosis not present

## 2019-08-25 ENCOUNTER — Other Ambulatory Visit: Payer: Self-pay

## 2019-08-25 ENCOUNTER — Ambulatory Visit
Admission: RE | Admit: 2019-08-25 | Discharge: 2019-08-25 | Disposition: A | Discharge status: Home / Self Care | Payer: Medicare Other | Source: Ambulatory Visit | Attending: Radiation Oncology | Admitting: Radiation Oncology

## 2019-08-25 DIAGNOSIS — C61 Malignant neoplasm of prostate: Secondary | ICD-10-CM | POA: Diagnosis not present

## 2019-08-26 ENCOUNTER — Other Ambulatory Visit: Payer: Self-pay | Admitting: Radiation Oncology

## 2019-08-26 ENCOUNTER — Other Ambulatory Visit: Payer: Self-pay

## 2019-08-26 ENCOUNTER — Ambulatory Visit
Admission: RE | Admit: 2019-08-26 | Discharge: 2019-08-26 | Disposition: A | Discharge status: Home / Self Care | Payer: Medicare Other | Source: Ambulatory Visit | Attending: Radiation Oncology | Admitting: Radiation Oncology

## 2019-08-26 DIAGNOSIS — C61 Malignant neoplasm of prostate: Secondary | ICD-10-CM | POA: Diagnosis not present

## 2019-08-26 MED ORDER — TAMSULOSIN HCL 0.4 MG PO CAPS: 0.4000 mg | ORAL_CAPSULE | Freq: Every day | ORAL | 2 refills | Status: DC

## 2019-08-29 ENCOUNTER — Ambulatory Visit
Admission: RE | Admit: 2019-08-29 | Discharge: 2019-08-29 | Disposition: A | Discharge status: Home / Self Care | Payer: Medicare Other | Source: Ambulatory Visit | Attending: Radiation Oncology | Admitting: Radiation Oncology

## 2019-08-29 ENCOUNTER — Other Ambulatory Visit: Payer: Self-pay

## 2019-08-29 DIAGNOSIS — C61 Malignant neoplasm of prostate: Secondary | ICD-10-CM | POA: Diagnosis not present

## 2019-08-30 ENCOUNTER — Ambulatory Visit
Admission: RE | Admit: 2019-08-30 | Discharge: 2019-08-30 | Disposition: A | Discharge status: Home / Self Care | Payer: Medicare Other | Source: Ambulatory Visit | Attending: Radiation Oncology | Admitting: Radiation Oncology

## 2019-08-30 ENCOUNTER — Other Ambulatory Visit: Payer: Self-pay

## 2019-08-30 DIAGNOSIS — C61 Malignant neoplasm of prostate: Secondary | ICD-10-CM | POA: Diagnosis not present

## 2019-08-31 ENCOUNTER — Ambulatory Visit
Admission: RE | Admit: 2019-08-31 | Discharge: 2019-08-31 | Disposition: A | Discharge status: Home / Self Care | Payer: Medicare Other | Source: Ambulatory Visit | Attending: Radiation Oncology | Admitting: Radiation Oncology

## 2019-08-31 ENCOUNTER — Other Ambulatory Visit: Payer: Self-pay

## 2019-08-31 DIAGNOSIS — C61 Malignant neoplasm of prostate: Secondary | ICD-10-CM | POA: Diagnosis not present

## 2019-09-01 ENCOUNTER — Other Ambulatory Visit: Payer: Self-pay

## 2019-09-01 ENCOUNTER — Ambulatory Visit
Admission: RE | Admit: 2019-09-01 | Discharge: 2019-09-01 | Disposition: A | Discharge status: Home / Self Care | Payer: Medicare Other | Source: Ambulatory Visit | Attending: Radiation Oncology | Admitting: Radiation Oncology

## 2019-09-01 DIAGNOSIS — C61 Malignant neoplasm of prostate: Secondary | ICD-10-CM | POA: Diagnosis not present

## 2019-09-02 ENCOUNTER — Other Ambulatory Visit: Payer: Self-pay

## 2019-09-02 ENCOUNTER — Ambulatory Visit
Admission: RE | Admit: 2019-09-02 | Discharge: 2019-09-02 | Disposition: A | Discharge status: Home / Self Care | Payer: Medicare Other | Source: Ambulatory Visit | Attending: Radiation Oncology | Admitting: Radiation Oncology

## 2019-09-02 ENCOUNTER — Ambulatory Visit: Payer: Medicare Other

## 2019-09-02 DIAGNOSIS — C61 Malignant neoplasm of prostate: Secondary | ICD-10-CM | POA: Diagnosis not present

## 2019-09-05 ENCOUNTER — Ambulatory Visit
Admission: RE | Admit: 2019-09-05 | Discharge: 2019-09-05 | Disposition: A | Discharge status: Home / Self Care | Payer: Medicare Other | Source: Ambulatory Visit | Attending: Radiation Oncology | Admitting: Radiation Oncology

## 2019-09-05 ENCOUNTER — Encounter: Payer: Self-pay | Admitting: Radiation Oncology

## 2019-09-05 ENCOUNTER — Other Ambulatory Visit: Payer: Self-pay

## 2019-09-05 DIAGNOSIS — C61 Malignant neoplasm of prostate: Secondary | ICD-10-CM | POA: Insufficient documentation

## 2019-09-12 ENCOUNTER — Telehealth: Payer: Self-pay | Admitting: Radiation Oncology

## 2019-09-12 NOTE — Telephone Encounter (Signed)
Received call from patient today. Patient phoning to inquire "about how he is doing." Patient reports fatigue and urinary frequency. Explained the fatigue will slowly improve. Reinforced tips to promote energy. Explained that as inflammation cause from prostate radiation resolves the frequency should improve. Confirmed telephone follow up with Ashlyn Bruning, PA-C for 10/12/19 at 1530. Explained to the patient his response to radiation therapy will not be known until the PSA is collect 3 months s/p radiation treatment. Patient verbalized understanding of all reviewed.

## 2019-10-12 ENCOUNTER — Other Ambulatory Visit: Payer: Self-pay

## 2019-10-12 ENCOUNTER — Encounter: Payer: Self-pay | Admitting: Urology

## 2019-10-12 ENCOUNTER — Ambulatory Visit
Admission: RE | Admit: 2019-10-12 | Discharge: 2019-10-12 | Disposition: A | Discharge status: Home / Self Care | Payer: Medicare Other | Source: Ambulatory Visit | Attending: Urology | Admitting: Urology

## 2019-10-12 DIAGNOSIS — C61 Malignant neoplasm of prostate: Secondary | ICD-10-CM

## 2019-10-12 NOTE — Progress Notes (Signed)
Radiation Oncology         [phone] ________________________________  Name: Cody Gonzalez MRN: [id number]  Date: 10/12/2019  DOB: 09-12-1946  Post Treatment Note  CC: [name], FNP  [name], FNP  Diagnosis:   73 y.o. gentleman with Stage T1c adenocarcinoma of the prostate with Gleason score of 4+4 and PSA of 9.06  Interval Since Last Radiation:  5.5  weeks  08/01/19 - 09/05/19: The prostate and pelvic nodes were treated to 45 Gy in 25 fractions of 1.8 Gy, to supplement an up-front prostate seed implant boost of 110 Gy to achieve a total nominal dose of 165 Gy; concurrent with ADT (started ADT 04/05/19).  06/29/19:   Insertion of radioactive I-125 seeds into the prostate gland; 110 Gy, boost therapy  Narrative:  I spoke with the patient to conduct his routine scheduled 1 month follow up visit via telephone to spare the patient unnecessary potential exposure in the healthcare setting during the current COVID-19 pandemic.  The patient was notified in advance and gave permission to proceed with this visit format.  He tolerated radiation treatment well with only mild urinary bother and modest fatigue.  He continued to tolerate ADT well throughout his course of treatment.                            On review of systems, the patient states that he is doing well overall.  He has noticed gradual improvement in his LUTS as well as his energy level over the last 2 to 3 weeks.  His IPSS is 13 indicating moderate urinary bother with intermittency, frequency, weak flow of stream, urgency and nocturia 3-4 times per night.  He specifically denies dysuria, gross hematuria, straining to void or incontinence.  His pretreatment IPSS was 6.  He reports a healthy appetite and is maintaining his weight.  He denies abdominal pain, nausea, vomiting, diarrhea or constipation.  He is continued to tolerate the ADT fairly well with occasional hot flashes and mild fatigue.  Overall, he is quite pleased  with his progress to date.  ALLERGIES:  has No Known Allergies.  Meds: Current Outpatient Medications  Medication Sig Dispense Refill  . atorvastatin (LIPITOR) 10 MG tablet Take 10 mg by mouth daily.    Marland Kitchen senna-docusate (SENOKOT-S) 8.6-50 MG tablet Take 1 tablet by mouth daily. While taking strong pain meds to prevent constipation 20 tablet 0  . tamsulosin (FLOMAX) 0.4 MG CAPS capsule Take 1 capsule (0.4 mg total) by mouth daily after supper. For urinary urgency / frequency after prostate radiation 30 capsule 2  . amLODipine (NORVASC) 5 MG tablet Take 5 mg by mouth daily.    Marland Kitchen lisinopril (PRINIVIL,ZESTRIL) 2.5 MG tablet Take 2.5 mg by mouth daily.    . traMADol (ULTRAM) 50 MG tablet Take 1-2 tablets (50-100 mg total) by mouth every 6 (six) hours as needed for moderate pain or severe pain. Post-operatively (Patient not taking: Reported on 10/12/2019) 15 tablet 0   No current facility-administered medications for this encounter.     Physical Findings:  vitals were not taken for this visit.   /10 Unable to assess due to telephone follow-up visit format.  Lab Findings: Lab Results  Component Value Date   WBC 4.8 06/27/2019   HGB 13.4 06/27/2019   HCT 37.5 (L) 06/27/2019   MCV 92.6 06/27/2019   PLT 269 06/27/2019     Radiographic Findings: No results found.  Impression/Plan:  1.   73 y.o. gentleman with Stage T1c adenocarcinoma of the prostate with Gleason score of 4+4 and PSA of 9.06 He will continue to follow up with urology for ongoing PSA determinations and has an appointment scheduled with Dr. Tresa Moore on 10/17/19.  He has continued to tolerate the ADT well and anticipates completing a full 2 years of this therapy under the care and direction of Dr. Tresa Moore.  He has also continued taking Flomax each night and feels that this does improve his LUTS significantly.  He understands what to expect with regards to PSA monitoring going forward. I will look forward to following his response to  treatment via correspondence with urology, and would be happy to continue to participate in his care if clinically indicated. I talked to the patient about what to expect in the future, including his risk for erectile dysfunction and rectal bleeding. I encouraged him to call or return to the office if he has any questions regarding his previous radiation or possible radiation side effects. He was comfortable with this plan and will follow up as needed.    Nicholos Johns, PA-C

## 2019-10-23 NOTE — Progress Notes (Signed)
  Radiation Oncology         [phone]) [phone] ________________________________  Name: Embert Meserole MRN: [id number]  Date: 09/05/2019  DOB: Oct 29, 1946  End of Treatment Note  Diagnosis:   73 y.o. gentleman with Stage T1c adenocarcinoma of the prostate with Gleason score of 4+4 and PSA of 9.06     Indication for treatment:  Curative, Definitive Radiotherapy       Radiation treatment dates:   06/29/19 and then 08/01/19-09/05/19  Site/dose:  1. Radioactive seeds were implanted into the prostate for a total of 110 Gy. 2. The prostate, seminal vesicles, and pelvic lymph nodes were boosted with 45 Gy in 25 fractions of 1.8 Gy, for a total dose of 155 Gy  Beams/energy:  1. The radioactive seeds were delivered under sagittal guidance with 3D imaging. 2. The prostate, seminal vesicles, and pelvic lymph nodes were initially treated using helical intensity modulated radiotherapy delivering 6 megavolt photons. Image guidance was performed with megavoltage CT studies prior to each fraction. He was immobilized with a body fix lower extremity mold.   Narrative: The patient tolerated radiation treatment relatively well. The patient experienced some minor urinary irritation and modest fatigue.    Plan: The patient has completed radiation treatment. He will return to radiation oncology clinic for routine followup in one month. I advised him to call or return sooner if he has any questions or concerns related to his recovery or treatment. ________________________________  Sheral Apley. Tammi Klippel, M.D.

## 2019-11-17 ENCOUNTER — Telehealth: Payer: Self-pay | Admitting: Radiation Oncology

## 2019-11-17 NOTE — Telephone Encounter (Signed)
Received voicemail message from patient requesting return call. Phoned patient back. Patient reports hot flashes, tired legs worse in the morning, nocturia x 2, manageable urinary frequency, but tremendous  Fatigue. Patient endorses taking tamsulosin as prescribed. Patient endorses eating and drinking without difficulty. Patient completed radiation therapy on 09/05/2019. Patient receives ADT every six months with the next one due in June. Patient explains his last ADT injection was just a few weeks ago. Explained to the patient his fatigue is most likely related to the hormone therapy since it suppresses his testosterone. Explained the ADT is a vital part of controlling his cancer. Encouraged patient to follow up with Dr. Tresa Moore at [facility] to see if he has any recommendations. Attempted to discuss realistic expectations with patient and encourage him to allow himself grace. Patient verbalized understanding of all reviewed and expressed he felt better after talking through his situation. Patient understands he can contact this RN for future needs or concerns.

## 2019-11-27 ENCOUNTER — Other Ambulatory Visit: Payer: Self-pay | Admitting: Radiation Oncology

## 2019-11-27 ENCOUNTER — Ambulatory Visit: Payer: Medicare Other | Attending: Internal Medicine

## 2019-11-27 DIAGNOSIS — Z23 Encounter for immunization: Secondary | ICD-10-CM | POA: Insufficient documentation

## 2019-11-27 NOTE — Progress Notes (Signed)
   Covid-19 Vaccination Clinic  Name:  Cody Gonzalez    MRN: [id number] DOB: 09/30/1946  11/27/2019  Mr. Christie was observed post Covid-19 immunization for 15 minutes without incidence. He was provided with Vaccine Information Sheet and instruction to access the V-Safe system.   Mr. Mccrary was instructed to call 911 with any severe reactions post vaccine: Marland Kitchen Difficulty breathing  . Swelling of your face and throat  . A fast heartbeat  . A bad rash all over your body  . Dizziness and weakness    Immunizations Administered    Name Date Dose VIS Date Route   Pfizer COVID-19 Vaccine 11/27/2019  1:55 PM 0.3 mL 10/14/2019 Intramuscular   Manufacturer: [place]   Lot: [id number]   [place]: [id number]

## 2019-12-02 ENCOUNTER — Ambulatory Visit: Payer: Medicare Other

## 2019-12-15 ENCOUNTER — Ambulatory Visit: Payer: Medicare Other | Attending: Internal Medicine

## 2019-12-15 DIAGNOSIS — Z23 Encounter for immunization: Secondary | ICD-10-CM

## 2019-12-15 NOTE — Progress Notes (Signed)
   Covid-19 Vaccination Clinic  Name:  Cody Gonzalez    MRN: [id number] DOB: 1946/07/19  12/15/2019  [name] was observed post Covid-19 immunization for 15 minutes without incidence. He was provided with Vaccine Information Sheet and instruction to access the V-Safe system.   [name] was instructed to call 911 with any severe reactions post vaccine: Marland Kitchen Difficulty breathing  . Swelling of your face and throat  . A fast heartbeat  . A bad rash all over your body  . Dizziness and weakness    Immunizations Administered    Name Date Dose VIS Date Route   Pfizer COVID-19 Vaccine 12/15/2019 12:10 PM 0.3 mL 10/14/2019 Intramuscular   Manufacturer: [employer], [employer]   Lot: [id number]   [place]: [id number]

## 2019-12-19 ENCOUNTER — Ambulatory Visit: Payer: Medicare Other

## 2019-12-26 ENCOUNTER — Other Ambulatory Visit: Payer: Self-pay | Admitting: Radiation Oncology

## 2019-12-26 ENCOUNTER — Telehealth: Payer: Self-pay | Admitting: Radiation Oncology

## 2019-12-26 NOTE — Telephone Encounter (Signed)
Received voicemail message from patient requesting return call about leg weakness and shortness of breath. Phoned patient back. No answer. Left voicemail message explaining that leg weakness and shortness of breath are unrelated to the prostate radiation he received the end of August into the beginning of November 2020. Encouraged patient to follow up with PCP reference these concerns. Provided my direct contact number for future questions or concerns.

## 2020-01-05 ENCOUNTER — Ambulatory Visit (INDEPENDENT_AMBULATORY_CARE_PROVIDER_SITE_OTHER): Payer: Medicare Other | Admitting: Adult Health

## 2020-01-05 ENCOUNTER — Encounter: Payer: Self-pay | Admitting: Adult Health

## 2020-01-05 ENCOUNTER — Other Ambulatory Visit: Payer: Self-pay

## 2020-01-05 VITALS — BP 146/80 | HR 76 | Temp 97.7°F | Ht 67.0 in | Wt 171.2 lb

## 2020-01-05 DIAGNOSIS — G4733 Obstructive sleep apnea (adult) (pediatric): Secondary | ICD-10-CM | POA: Diagnosis not present

## 2020-01-05 DIAGNOSIS — Z9989 Dependence on other enabling machines and devices: Secondary | ICD-10-CM

## 2020-01-05 DIAGNOSIS — R413 Other amnesia: Secondary | ICD-10-CM

## 2020-01-05 NOTE — Patient Instructions (Addendum)

## 2020-01-05 NOTE — Progress Notes (Signed)
PATIENT: Cody Gonzalez DOB: [birth year]  REASON FOR VISIT: follow up HISTORY FROM: patient  HISTORY OF PRESENT ILLNESS: Today 01/05/20:  Cody Gonzalez is a 74 year old male with a history of obstructive sleep apnea on CPAP.  He returns today for follow-up.  His download indicates that he uses his machine nightly for compliance of 100%.  He uses his machine greater than 4 hours each night.  On average he uses his machine 7 hours and 40 minutes.  His residual AHI is 13.2 on 6 to 10 cm of water with EPR of 3.  He reports that the CPAP is working well for him.   He felt that his memory has remained stable.  He states that he sometimes has issues with immediate recall.  He continues to live at home with his spouse.  Able to complete all ADLs independently.  He returns today for an evaluation.  HISTORY 07/04/19:  Cody Gonzalez is a 74 year old male with a history of obstructive sleep apnea on CPAP and memory disturbance.  He returns today for evaluation of his memory.  Overall he feels that his memory is stable.  He states that sometimes he draws a blank but after several minutes he is able to recall what he needs to.  He denies any reports by his family or spouse of memory trouble.  He lives at home with his spouse.  He is able to complete all ADLs independently.  His wife handles the finances.  He reports that she is always done this.  He operates a Teacher, music without difficulty.  He reports that sometimes he has decreased confidence with directions but is always going the right way.  He does help with meals.  Reports that he does bake bread frequently.  He is able to remember recipes.  Overall he feels that he is doing well.  He returns today for an evaluation.  REVIEW OF SYSTEMS: Out of a complete 14 system review of symptoms, the patient complains only of the following symptoms, and all other reviewed systems are negative.  FSS 8 ESS0  ALLERGIES: No Known Allergies  HOME  MEDICATIONS: Outpatient Medications Prior to Visit  Medication Sig Dispense Refill  . amLODipine (NORVASC) 5 MG tablet Take 5 mg by mouth daily.    Marland Kitchen atorvastatin (LIPITOR) 10 MG tablet Take 10 mg by mouth daily.    Marland Kitchen lisinopril (PRINIVIL,ZESTRIL) 2.5 MG tablet Take 2.5 mg by mouth daily.    Marland Kitchen senna-docusate (SENOKOT-S) 8.6-50 MG tablet Take 1 tablet by mouth daily. While taking strong pain meds to prevent constipation 20 tablet 0  . tamsulosin (FLOMAX) 0.4 MG CAPS capsule TAKE 1 CAPSULE DAILY AFTER SUPPER. FOR UINARY URGENCY/FREQUENCY AFTERPROSTRATE RADIATION. 30 capsule 0  . traMADol (ULTRAM) 50 MG tablet Take 1-2 tablets (50-100 mg total) by mouth every 6 (six) hours as needed for moderate pain or severe pain. Post-operatively 15 tablet 0   No facility-administered medications prior to visit.    PAST MEDICAL HISTORY: Past Medical History:  Diagnosis Date  . Hypertension   . Prostate cancer ([place])   . Sleep apnea    uses cpap   . TBI (traumatic brain injury) ([place]) 2015    PAST SURGICAL HISTORY: Past Surgical History:  Procedure Laterality Date  . BILATERAL CARPAL TUNNEL RELEASE  2014  . CYSTOSCOPY N/A 06/29/2019   Procedure: CYSTOSCOPY;  Surgeon: Alexis Frock, MD;  Location: [facility];  Service: Urology;  Laterality: N/A;  No seeds in bladder per Dr.  Manny  . PROSTATE BIOPSY    . RADIOACTIVE SEED IMPLANT N/A 06/29/2019   Procedure: RADIOACTIVE SEED IMPLANT/BRACHYTHERAPY IMPLANT;  Surgeon: Alexis Frock, MD;  Location: [facility];  Service: Urology;  Laterality: N/A;  90 MINS  . SPACE OAR INSTILLATION N/A 06/29/2019   Procedure: SPACE OAR INSTILLATION;  Surgeon: Alexis Frock, MD;  Location: [facility];  Service: Urology;  Laterality: N/A;    FAMILY HISTORY: Family History  Problem Relation Age of Onset  . Brain cancer Father        GBM    SOCIAL HISTORY: Social History   Socioeconomic History  . Marital status:  Married    Spouse name: Not on file  . Number of children: Not on file  . Years of education: Not on file  . Highest education level: Not on file  Occupational History  . 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 Smoking status: Never Smoker  . Smokeless tobacco: Never Used  Substance and Sexual Activity  . Alcohol use: Not Currently    Comment: nonalcoholic beer only  . Drug use: No  . Sexual activity: Yes    Comment: with aid of sildenafil  Other Topics Concern  . Not on file  Social History Narrative   Married. 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 Preferred name: Cody Gonzalez. Enjoys cycling.   Social Determinants of Health   Financial Resource Strain:   . Difficulty of Paying Living Expenses: Not on file  Food Insecurity:   . Worried About Charity fundraiser in the Last Year: Not on file  . Ran Out of Food in the Last Year: Not on file  Transportation Needs:   . Lack of Transportation (Medical): Not on file  . Lack of Transportation (Non-Medical): Not on file  Physical Activity:   . Days of Exercise per Week: Not on file  . Minutes of Exercise per Session: Not on file  Stress:   . Feeling of Stress : Not on file  Social Connections:   . Frequency of Communication with Friends and Family: Not on file  . Frequency of Social Gatherings with Friends and Family: Not on file  . Attends Religious Services: Not on file  . Active Member of Clubs or Organizations: Not on file  . Attends Archivist Meetings: Not on file  . Marital Status: Not on file  Intimate Partner Violence:   . Fear of Current or Ex-Partner: Not on file  . Emotionally Abused: Not on file  . Physically Abused: Not on file  . Sexually Abused: Not on file      PHYSICAL EXAM  Vitals:   01/05/20 1440  BP: (!) 146/80  Pulse: 76  Temp: 97.7 F (36.5 C)  TempSrc: Oral  Weight: 171 lb 3.2 oz (77.7 kg)  Height: 5\' 7"  (1.702 m)   Body mass  index is 26.81 kg/m.   Montreal Cognitive Assessment  07/04/2019  Visuospatial/ Executive (0/5) 5  Naming (0/3) 3  Attention: Read list of digits (0/2) 1  Attention: Read list of letters (0/1) 1  Attention: Serial 7 subtraction starting at 100 (0/3) 3  Language: Repeat phrase (0/2) 2  Language : Fluency (0/1) 1  Abstraction (0/2) 2  Delayed Recall (0/5) 1  Orientation (0/6) 5  Total 24    Generalized: Well developed, in no acute distress  Chest: Lungs clear to auscultation bilaterally  Neurological examination  Mentation:  Alert oriented to time, place, history taking. Follows all commands speech and language fluent Cranial nerve II-XII: Extraocular movements were full, visual field were full on confrontational test Head turning and shoulder shrug  were normal and symmetric. Motor: The motor testing reveals 5 over 5 strength of all 4 extremities. Good symmetric motor tone is noted throughout.  Sensory: Sensory testing is intact to soft touch on all 4 extremities. No evidence of extinction is noted.  Gait and station: Gait is normal.    DIAGNOSTIC DATA (LABS, IMAGING, TESTING) - I reviewed patient records, labs, notes, testing and imaging myself where available.  Lab Results  Component Value Date   WBC 4.8 06/27/2019   HGB 13.4 06/27/2019   HCT 37.5 (L) 06/27/2019   MCV 92.6 06/27/2019   PLT 269 06/27/2019      Component Value Date/Time   NA 130 (L) 06/27/2019 1209   K 4.2 06/27/2019 1209   CL 93 (L) 06/27/2019 1209   CO2 26 06/27/2019 1209   GLUCOSE 106 (H) 06/27/2019 1209   BUN 18 06/27/2019 1209   CREATININE 0.80 06/27/2019 1209   CALCIUM 9.4 06/27/2019 1209   PROT 7.4 06/27/2019 1209   ALBUMIN 4.3 06/27/2019 1209   AST 25 06/27/2019 1209   ALT 29 06/27/2019 1209   ALKPHOS 53 06/27/2019 1209   BILITOT 1.1 06/27/2019 1209   GFRNONAA >60 06/27/2019 1209   GFRAA >60 06/27/2019 1209      ASSESSMENT AND PLAN 74 y.o. year old male  has a past medical history of  Hypertension, Prostate cancer ([place]), Sleep apnea, and TBI (traumatic brain injury) ([place]) (2015). here with:  1. OSA on CPAP  - CPAP compliance excellent - Good treatment of AHI  - Encourage patient to use CPAP nightly and > 4 hours each night - F/U in 1 year or sooner if needed  2.  Memory disturbance  - MOCA  - will continue to monitor  I spent 15 minutes of face-to-face and non-face-to-face time with patient.  This included previsit chart review, lab review, study review, order entry, electronic health record documentation, patient education.  Ward Givens, MSN, NP-C 01/05/2020, 2:55 PM [facility] [address] [place], [place] [address] [phone]

## 2020-08-14 ENCOUNTER — Ambulatory Visit: Payer: Self-pay

## 2020-08-14 ENCOUNTER — Ambulatory Visit: Payer: Medicare Other | Attending: Internal Medicine

## 2020-08-14 ENCOUNTER — Other Ambulatory Visit (HOSPITAL_BASED_OUTPATIENT_CLINIC_OR_DEPARTMENT_OTHER): Payer: Self-pay | Admitting: Internal Medicine

## 2020-08-14 DIAGNOSIS — Z23 Encounter for immunization: Secondary | ICD-10-CM

## 2020-08-14 NOTE — Progress Notes (Unsigned)
   Covid-19 Vaccination Clinic  Name:  Cody Gonzalez    MRN: [id number] DOB: 04-25-1946  08/14/2020  Mr. Cody Gonzalez was observed post Covid-19 immunization for 15 minutes without incident. He was provided with Vaccine Information Sheet and instruction to access the V-Safe system.  Vaccinated by Kristeen Miss  Mr. Cody Gonzalez was instructed to call 911 with any severe reactions post vaccine: Marland Kitchen Difficulty breathing  . Swelling of face and throat  . A fast heartbeat  . A bad rash all over body  . Dizziness and weakness

## 2020-08-22 MED FILL — PFIZER-BIONTECH COVID-19 VA: 30 | 1 days supply | Qty: 0 | Fill #0

## 2020-08-24 ENCOUNTER — Telehealth: Payer: Self-pay | Admitting: Adult Health

## 2020-08-24 NOTE — Telephone Encounter (Signed)
Pt called wanting to know if the RN can call him on Monday so that he can discuss with her a letter he received from [employer] the cpap company.

## 2020-08-27 NOTE — Telephone Encounter (Signed)
I spoke to pt and relayed that per the [employer] recall he is to register his machine to at [phone] to [facility].  He answered no relating to heat and using the ozone cleaners.  I told him that he is able to continue to use his machine at his own discretion. He verbalized understanding.

## 2020-11-12 ENCOUNTER — Telehealth: Payer: Self-pay | Admitting: Adult Health

## 2020-11-12 NOTE — Telephone Encounter (Signed)
I called pt and he is asking for a sooner appt. (for back pain)  Has appt 12-11-20 at 1400 with Dr. Brett Fairy.  I could not help him as no availability on Dr. Edwena Felty schedule at new consult.  Would you approve any of the OV (held) or sleep consult in the next 2 days?

## 2020-11-12 NOTE — Telephone Encounter (Signed)
Pt called, having some discomfort in lower back sitting and standing. Would like to discuss with nurse a sooner appt than 2/8.

## 2020-11-12 NOTE — Telephone Encounter (Signed)
There is no availability in the next 2 days, but we can open a sleep consult for next week for him. CD

## 2020-11-21 NOTE — Telephone Encounter (Signed)
Called the patient to advise Dr Brett Fairy had an opening come available for 1/20. There was no answer. LVM asking the pt to call back asap and let us know if he can take the opening available with Dr Dohmeier on 11/22/2020.

## 2020-11-21 NOTE — Telephone Encounter (Signed)
Pt returned call and accepted the apt

## 2020-11-22 ENCOUNTER — Other Ambulatory Visit: Payer: Self-pay

## 2020-11-22 ENCOUNTER — Ambulatory Visit (INDEPENDENT_AMBULATORY_CARE_PROVIDER_SITE_OTHER): Payer: Medicare Other | Admitting: Neurology

## 2020-11-22 ENCOUNTER — Telehealth: Payer: Self-pay | Admitting: Neurology

## 2020-11-22 ENCOUNTER — Encounter: Payer: Self-pay | Admitting: Neurology

## 2020-11-22 DIAGNOSIS — S060X1S Concussion with loss of consciousness of 30 minutes or less, sequela: Secondary | ICD-10-CM | POA: Diagnosis not present

## 2020-11-22 DIAGNOSIS — R2681 Unsteadiness on feet: Secondary | ICD-10-CM | POA: Diagnosis not present

## 2020-11-22 DIAGNOSIS — G4733 Obstructive sleep apnea (adult) (pediatric): Secondary | ICD-10-CM | POA: Diagnosis not present

## 2020-11-22 DIAGNOSIS — Z9989 Dependence on other enabling machines and devices: Secondary | ICD-10-CM | POA: Diagnosis not present

## 2020-11-22 DIAGNOSIS — C61 Malignant neoplasm of prostate: Secondary | ICD-10-CM

## 2020-11-22 DIAGNOSIS — G3184 Mild cognitive impairment, so stated: Secondary | ICD-10-CM

## 2020-11-22 DIAGNOSIS — R413 Other amnesia: Secondary | ICD-10-CM | POA: Insufficient documentation

## 2020-11-22 NOTE — Telephone Encounter (Signed)
Medicare/mutual of [place] order sent to [facility]. No auth they will reach out to the patient to schedule.  
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
## 2020-11-22 NOTE — Patient Instructions (Signed)
Fall Prevention in the Home, Adult Falls can cause injuries and can happen to people of all ages. There are many things you can do to make your home safe and to help prevent falls. Ask for help when making these changes. What actions can I take to prevent falls? General Instructions  Use good lighting in all rooms. Replace any light bulbs that burn out.  Turn on the lights in dark areas. Use night-lights.  Keep items that you use often in easy-to-reach places. Lower the shelves around your home if needed.  Set up your furniture so you have a clear path. Avoid moving your furniture around.  Do not have throw rugs or other things on the floor that can make you trip.  Avoid walking on wet floors.  If any of your floors are uneven, fix them.  Add color or contrast paint or tape to clearly mark and help you see: ? Grab bars or handrails. ? First and last steps of staircases. ? Where the edge of each step is.  If you use a stepladder: ? Make sure that it is fully opened. Do not climb a closed stepladder. ? Make sure the sides of the stepladder are locked in place. ? Ask someone to hold the stepladder while you use it.  Know where your pets are when moving through your home. What can I do in the bathroom?  Keep the floor dry. Clean up any water on the floor right away.  Remove soap buildup in the tub or shower.  Use nonskid mats or decals on the floor of the tub or shower.  Attach bath mats securely with double-sided, nonslip rug tape.  If you need to sit down in the shower, use a plastic, nonslip stool.  Install grab bars by the toilet and in the tub and shower. Do not use towel bars as grab bars.      What can I do in the bedroom?  Make sure that you have a light by your bed that is easy to reach.  Do not use any sheets or blankets for your bed that hang to the floor.  Have a firm chair with side arms that you can use for support when you get dressed. What can I do in  the kitchen?  Clean up any spills right away.  If you need to reach something above you, use a step stool with a grab bar.  Keep electrical cords out of the way.  Do not use floor polish or wax that makes floors slippery. What can I do with my stairs?  Do not leave any items on the stairs.  Make sure that you have a light switch at the top and the bottom of the stairs.  Make sure that there are handrails on both sides of the stairs. Fix handrails that are broken or loose.  Install nonslip stair treads on all your stairs.  Avoid having throw rugs at the top or bottom of the stairs.  Choose a carpet that does not hide the edge of the steps on the stairs.  Check carpeting to make sure that it is firmly attached to the stairs. Fix carpet that is loose or worn. What can I do on the outside of my home?  Use bright outdoor lighting.  Fix the edges of walkways and driveways and fix any cracks.  Remove anything that might make you trip as you walk through a door, such as a raised step or threshold.  Trim any   bushes or trees on paths to your home.  Check to see if handrails are loose or broken and that both sides of all steps have handrails.  Install guardrails along the edges of any raised decks and porches.  Clear paths of anything that can make you trip, such as tools or rocks.  Have leaves, snow, or ice cleared regularly.  Use sand or salt on paths during winter.  Clean up any spills in your garage right away. This includes grease or oil spills. What other actions can I take?  Wear shoes that: ? Have a low heel. Do not wear high heels. ? Have rubber bottoms. ? Feel good on your feet and fit well. ? Are closed at the toe. Do not wear open-toe sandals.  Use tools that help you move around if needed. These include: ? Canes. ? Walkers. ? Scooters. ? Crutches.  Review your medicines with your doctor. Some medicines can make you feel dizzy. This can increase your chance  of falling. Ask your doctor what else you can do to help prevent falls. Where to find more information  Centers for Disease Control and Prevention, [facility]AD[employer]: www.cdc.gov  National Institute on Aging: www.nia.nih.gov Contact a doctor if:  You are afraid of falling at home.  You feel weak, drowsy, or dizzy at home.  You fall at home. Summary  There are many simple things that you can do to make your home safe and to help prevent falls.  Ways to make your home safe include removing things that can make you trip and installing grab bars in the bathroom.  Ask for help when making these changes in your home. This information is not intended to replace advice given to you by your health care provider. Make sure you discuss any questions you have with your health care provider. Document Revised: 05/23/2020 Document Reviewed: 05/23/2020 Elsevier Patient Education  2021 Elsevier Inc.  

## 2020-11-22 NOTE — Telephone Encounter (Signed)
Pt. is wanting a call back from RN to let him know if his wife will be able to come back to his appt. visit with him. Please advise.

## 2020-11-22 NOTE — Telephone Encounter (Signed)
Called the pt back and advised the wife could come back during the apt.

## 2020-11-23 ENCOUNTER — Ambulatory Visit
Admission: RE | Admit: 2020-11-23 | Discharge: 2020-11-23 | Disposition: A | Discharge status: Home / Self Care | Payer: Medicare Other | Source: Ambulatory Visit | Attending: Neurology | Admitting: Neurology

## 2020-11-23 DIAGNOSIS — S060X1S Concussion with loss of consciousness of 30 minutes or less, sequela: Secondary | ICD-10-CM

## 2020-11-23 DIAGNOSIS — G3184 Mild cognitive impairment, so stated: Secondary | ICD-10-CM

## 2020-11-23 DIAGNOSIS — G4733 Obstructive sleep apnea (adult) (pediatric): Secondary | ICD-10-CM

## 2020-11-23 DIAGNOSIS — R2681 Unsteadiness on feet: Secondary | ICD-10-CM

## 2020-11-23 DIAGNOSIS — R413 Other amnesia: Secondary | ICD-10-CM

## 2020-11-23 IMAGING — CT CT L SPINE W/O CM
3 of 4 series · 12 of 33 positions shown, 14 images · non-contrast
Comparison: None.

CLINICAL DATA: Ataxia, nontraumatic lumbar pain.

EXAM:
CT LUMBAR SPINE WITHOUT CONTRAST
TECHNIQUE: Multidetector CT imaging of the lumbar spine was performed without
intravenous contrast administration. Multiplanar CT image
reconstructions were also generated.

[Series 3: l-spine 2.00 br40 s3 lspine st · axial · 0.34mm/px · z∈[+1434,+1594]mm · 4 of 122 slices shown, 5 images]
[im 21/122  soft-tissue]
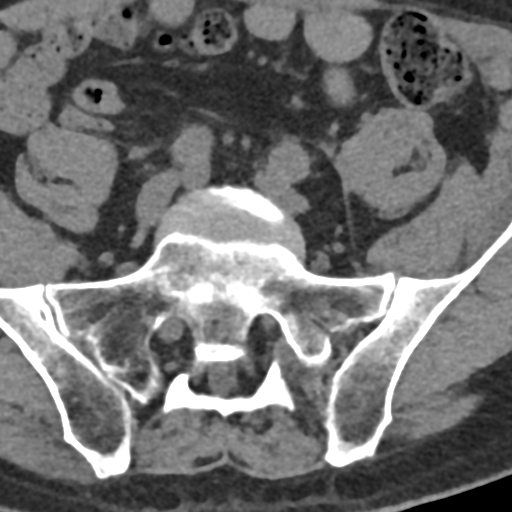
[im 21/122  bone]
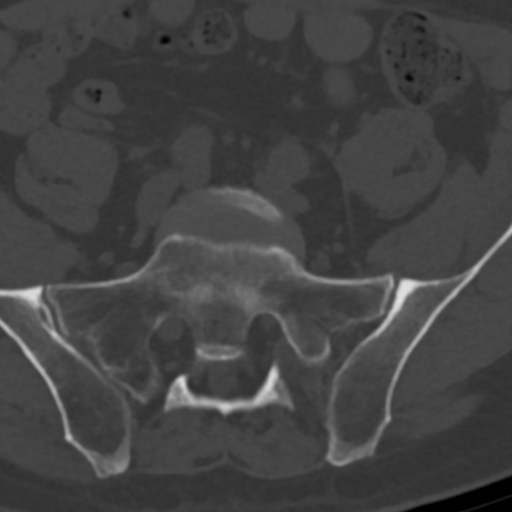
[im 41/122  bone]
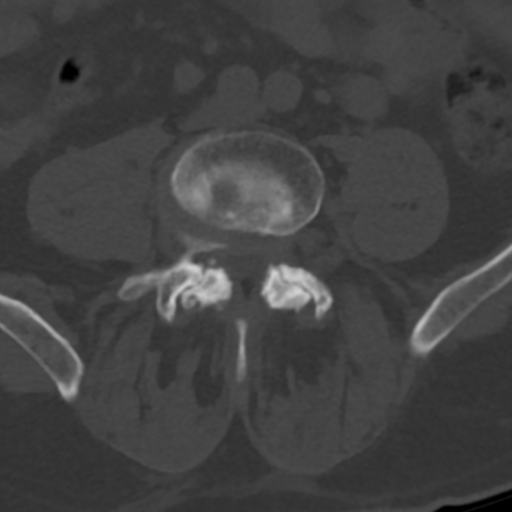
[im 81/122  bone]
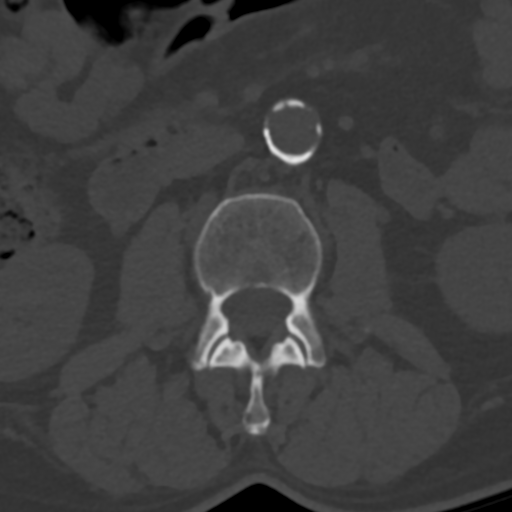
[im 101/122  bone]
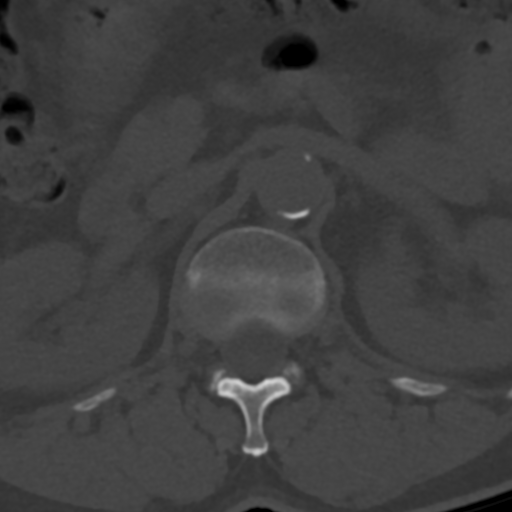

[Series 5: l-spine 2.00 br60 s3 sag bone · sagittal · 0.29mm/px · 5 of 75 slices shown, 6 images]
[im 25/75  bone]
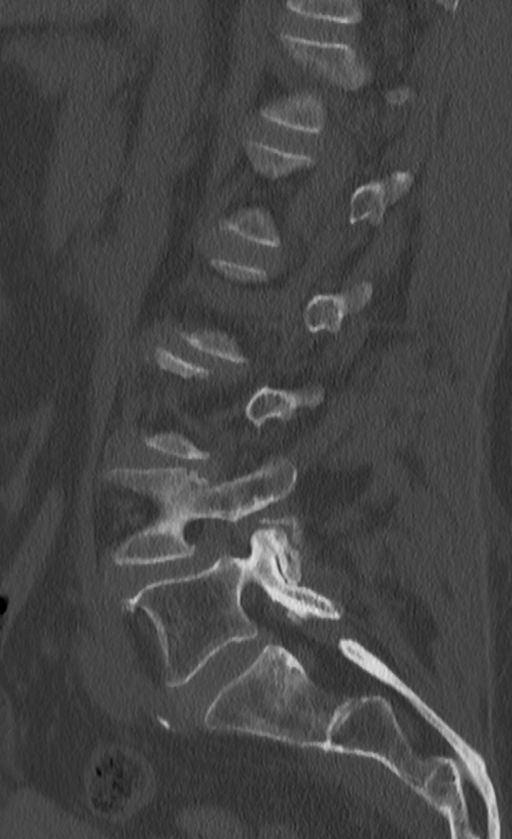
[im 31/75  bone]
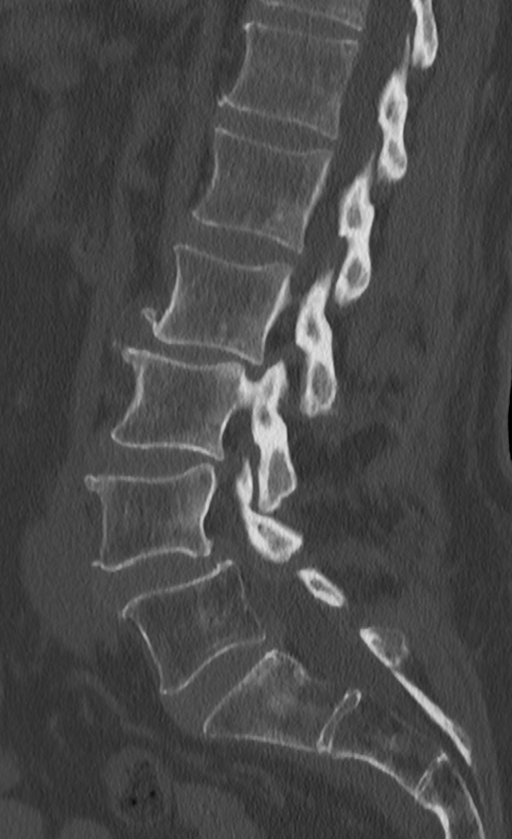
[im 38/75  soft-tissue]
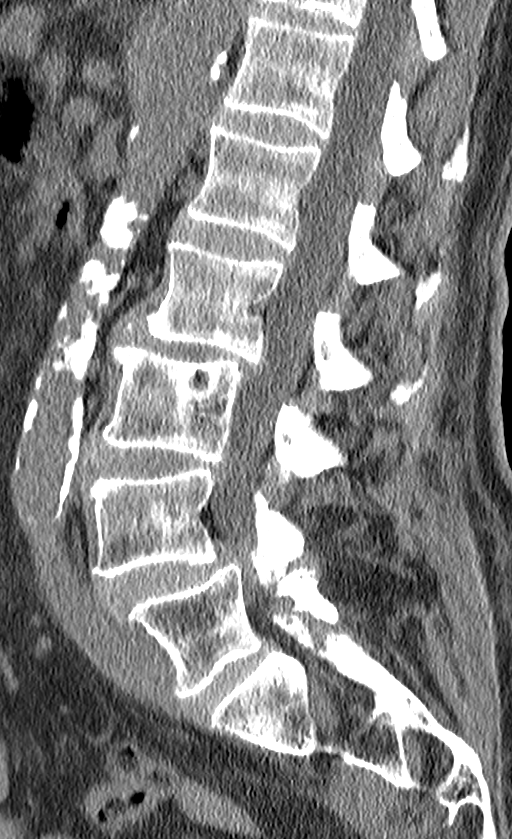
[im 38/75  bone]
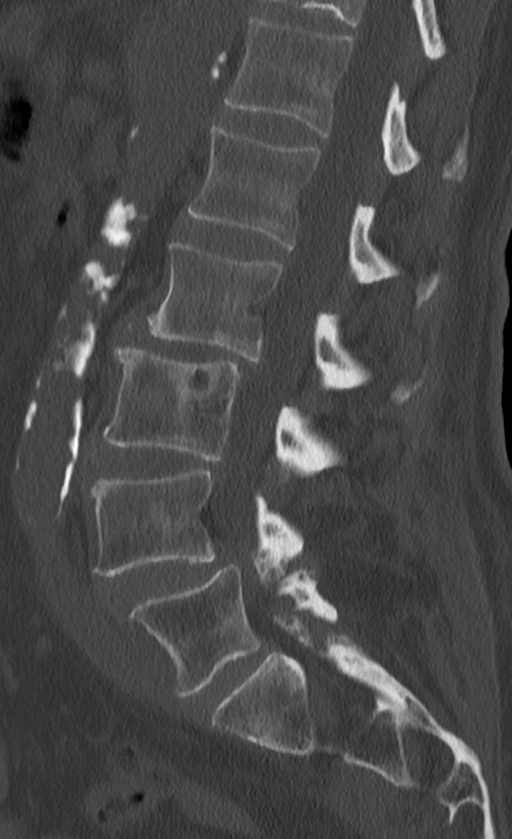
[im 44/75  bone]
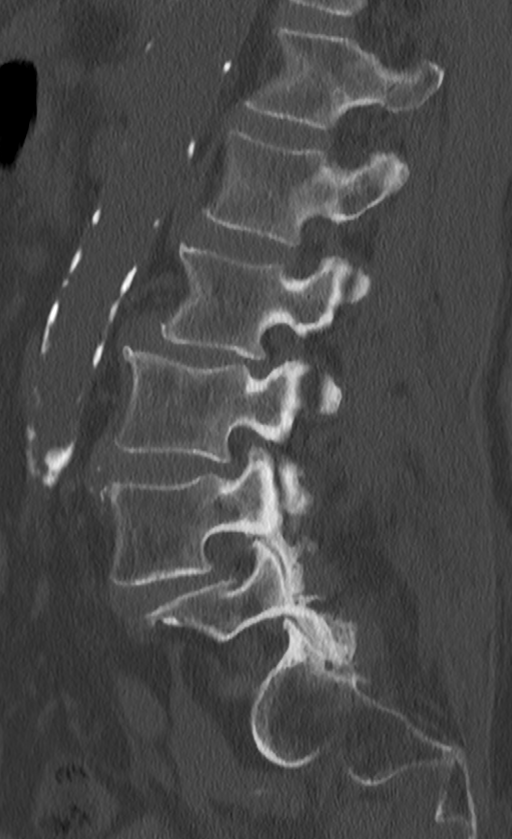
[im 50/75  bone]
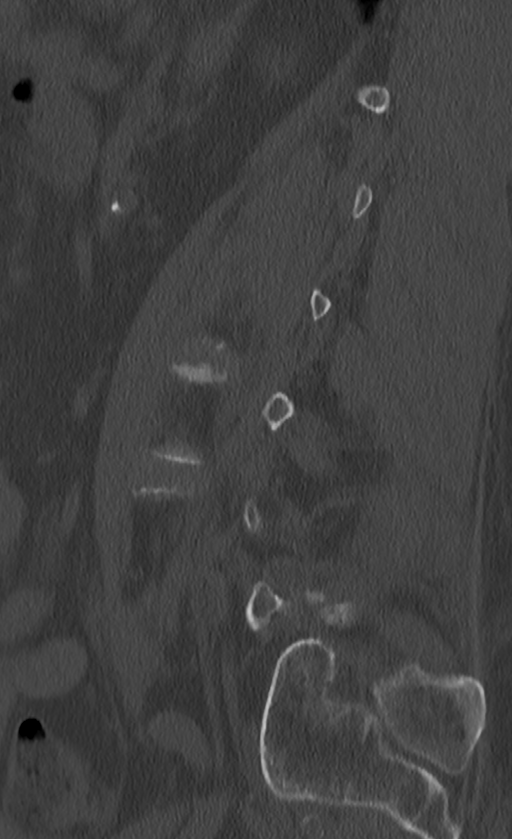

[Series 7: l-spine 2.00 br60 s3 cor bone · coronal · 0.30mm/px · 3 of 74 slices shown]
[im 15/74  bone]
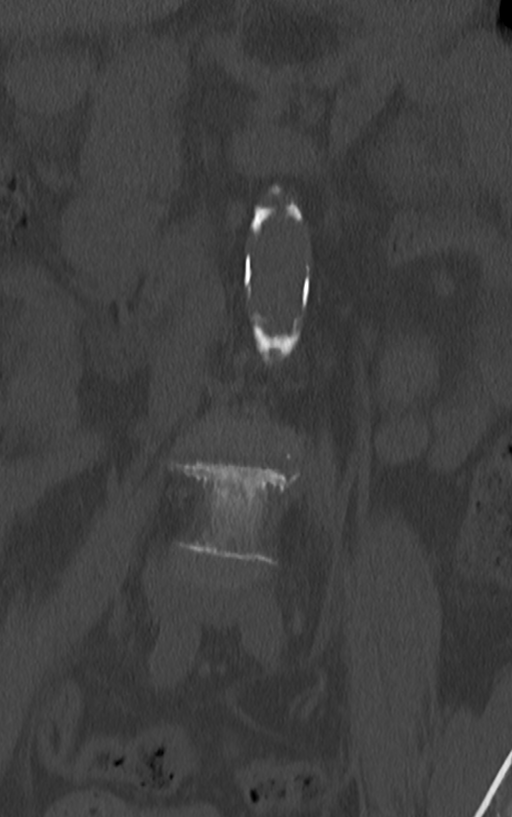
[im 30/74  bone]
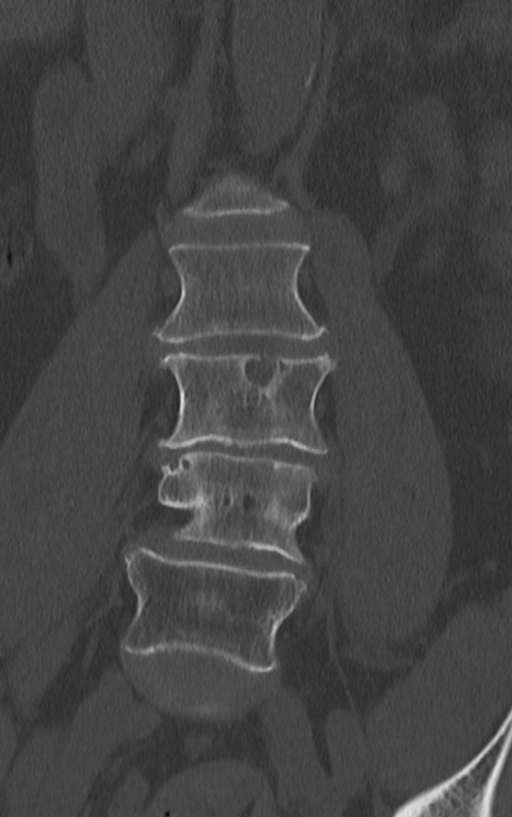
[im 44/74  bone]
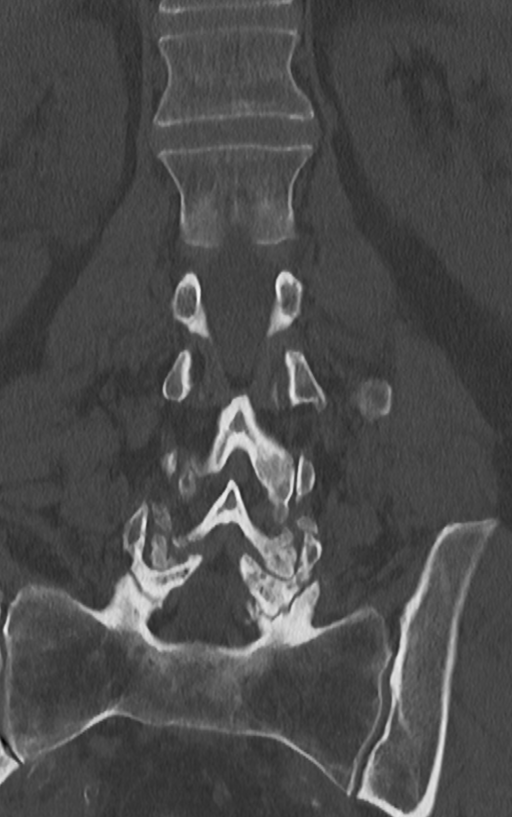

[12 of 33 positions shown; findings below may reference images not displayed]

FINDINGS: Segmentation: 5 lumbar type vertebrae.

Alignment: 4 mm retrolisthesis of L2 on L3. 2 mm retrolisthesis of
L3 on L4. Grade 1 anterolisthesis of L4 on L5 secondary to facet
disease.

Vertebrae: No acute fracture or focal pathologic process.

Paraspinal and other soft tissues: No acute paraspinal abnormality.
Abdominal aortic atherosclerosis.

Disc levels:

Disc spaces: Degenerative disease with disc height loss at L2-3,
L3-4, and L4-5.

T12-L1: No disc protrusion, foraminal stenosis or central canal
stenosis.

L1-L2: No disc protrusion, foraminal stenosis or central canal
stenosis.

L2-L3: Broad-based disc bulge. Mild spinal stenosis. No foraminal
stenosis.

L3-L4: Broad-based disc bulge. Mild bilateral facet arthropathy.
Mild spinal stenosis. No foraminal stenosis.

L4-L5: Broad-based disc bulge. Severe bilateral facet arthropathy.
Severe spinal stenosis. Mild bilateral foraminal stenosis.

L5-S1: Broad-based disc bulge. Severe left and mild right facet
arthropathy. No foraminal or central canal stenosis.
IMPRESSION: 1. Diffuse lumbar spine spondylosis as described above.
2. No acute osseous injury of the lumbar spine.
3. Aortic Atherosclerosis ([5M]-[5M]).

## 2020-11-23 NOTE — Progress Notes (Signed)
L2-L3: Broad-based disc bulge. Mild spinal stenosis. No foraminal stenosis.  L3-L4: Broad-based disc bulge. Mild bilateral facet arthropathy. Mild spinal stenosis. No foraminal stenosis.  L4-L5: Broad-based disc bulge. Severe bilateral facet arthropathy. Severe spinal stenosis. Mild bilateral foraminal stenosis.  L5-S1: Broad-based disc bulge. Severe left and mild right facet arthropathy. No foraminal or central canal stenosis.  IMPRESSION: 1. Diffuse lumbar spine spondylosis as described above. 2. No acute osseous injury of the lumbar spine. 3. Aortic Atherosclerosis (ICD10-I70.0).   There are several levels where nerve compression could be occurring- certainly increasing stenosis and degeneration as the spine reaches sacrum.    I recommend a NCV and EMG- my main concern being weakness in the lower extremities.   PS :Please note that there is no evidence of lytic bone lesions which would be indicating metastatic prostate cancer.    Larey Seat, MD  Sleep Medicine and Neurology, EEG.

## 2020-11-27 ENCOUNTER — Telehealth: Payer: Self-pay | Admitting: Neurology

## 2020-11-27 NOTE — Telephone Encounter (Signed)
Called the patient back and spoke with his wife and the patient.  Was able to advise of the CT findings informed them what they showed.  Informed that Dr. Brett Fairy recommends doing a nerve conduction study to further evaluate if nerve impingement is involved.  Was able to schedule appointment for this Thursday 10 AM with Elmyra Ricks 10:45 AM with Dr. Jaynee Eagles.  They were appreciative for getting them on the schedule. In regards to the overnight oximetry test, advised that order was sent to the company January 20th.  Informed the patient that should be in contact soon, however if he has not heard anything he can give them a call.  Patient and wife verbalized understanding and had no further questions.    "IMPRESSION:  1. Diffuse lumbar spine spondylosis as described above.  2. No acute osseous injury of the lumbar spine.  3. Aortic Atherosclerosis (ICD10-I70.0).    There are several levels where nerve compression could be occurring- certainly increasing stenosis and degeneration as the spine reaches sacrum.    I recommend a NCV and EMG- my main concern being weakness in the lower extremities.   PS :Please note that there is no evidence of lytic bone lesions which would be indicating metastatic prostate cancer."

## 2020-11-27 NOTE — Telephone Encounter (Signed)
Pt would like to know if Dr Brett Fairy is ordering him an Oximeter.  Pt would also like the results to his CAT scan

## 2020-11-29 ENCOUNTER — Ambulatory Visit (INDEPENDENT_AMBULATORY_CARE_PROVIDER_SITE_OTHER): Payer: Medicare Other | Admitting: Neurology

## 2020-11-29 DIAGNOSIS — M79604 Pain in right leg: Secondary | ICD-10-CM

## 2020-11-29 DIAGNOSIS — Z0289 Encounter for other administrative examinations: Secondary | ICD-10-CM

## 2020-11-29 DIAGNOSIS — M545 Low back pain, unspecified: Secondary | ICD-10-CM

## 2020-11-29 DIAGNOSIS — G8929 Other chronic pain: Secondary | ICD-10-CM

## 2020-11-29 NOTE — Progress Notes (Signed)
Full Name: Cody Gonzalez Gender: Male MRN #: [id number] Date of Birth: 07/18/1946    Visit Date: 11/29/2020 10:16 Age: 75 Years Examining Physician: Sarina Ill, MD  Referring Physician: Larey Seat, MD    History: Low back pain Summary: Nerve conduction studies were performed on the lower extremities.  EMG needle exam was performed on the right lower extremity.  The left peroneal motor nerve showed decreased amplitude (0.8 mV, normal greater than 2).  The right peroneal motor nerve showed decreased amplitude (1.5 mV, normal greater than 2).  The left superficial peroneal sensory nerve showed decreased amplitude (4 V, normal greater than 6).  The right superficial peroneal sensory nerve showed decreased amplitude (5 V, normal greater than 6).  The left tibial F wave showed delayed latency (58.8 ms, normal less than 56).  The right tibial F wave showed delayed latency (61 ms, normal less than 56).All remaining nerves (as indicated in the following tables) were within normal limits.  All muscles (as indicated in the following tables) were within normal limits.     Conclusion: Decreased sensorimotor peroneal conductions with delayed tibial F waves likely due to axonal peripheral polyneuropathy.  No denervation was seen on EMG needle study to suggest acute or ongoing radiculopathy.     ------------------------------- Sarina Ill, M.D.  [facility] [address], [place], [place] [address] Tel: [phone] Fax: [phone]  Verbal informed consent was obtained from the patient, patient was informed of potential risk of procedure, including bruising, bleeding, hematoma formation, infection, muscle weakness, muscle pain, numbness, among others.        Dundy    Nerve / Sites Muscle Latency Ref. Amplitude Ref. Rel Amp Segments Distance Velocity Ref. Area    ms ms mV mV %  cm m/s m/s mVms  L Peroneal - EDB     Ankle EDB 6.5 ?6.5 0.8 ?2.0 100  Ankle - EDB 9   3.4     Fib head EDB 14.4  0.8  108 Fib head - Ankle 28 35 ?44 3.4     Pop fossa EDB 17.3  0.8  92.9 Pop fossa - Fib head 10 34 ?44 3.3         Pop fossa - Ankle      R Peroneal - EDB     Ankle EDB 6.1 ?6.5 1.5 ?2.0 100 Ankle - EDB 9   4.8     Fib head EDB 14.6  1.2  79.5 Fib head - Ankle 29 34 ?44 2.8     Pop fossa EDB 17.6  1.3  110 Pop fossa - Fib head 10 34 ?44 3.0         Pop fossa - Ankle      L Tibial - AH     Ankle AH 5.2 ?5.8 10.5 ?4.0 100 Ankle - AH 9   24.6     Pop fossa AH 14.3  5.4  50.8 Pop fossa - Ankle 37 41 ?41 31.0  R Tibial - AH     Ankle AH 5.0 ?5.8 9.7 ?4.0 100 Ankle - AH 9   21.4     Pop fossa AH 14.6  7.6  78.3 Pop fossa - Ankle 39 41 ?41 17.9             SNC    Nerve / Sites Rec. Site Peak Lat Ref.  Amp Ref. Segments Distance    ms ms V V  cm  L Sural -  Ankle (Calf)     Calf Ankle 4.3 ?4.4 7 ?6 Calf - Ankle 14  R Sural - Ankle (Calf)     Calf Ankle 4.4 ?4.4 12 ?6 Calf - Ankle 14  L Superficial peroneal - Ankle     Lat leg Ankle 4.4 ?4.4 4 ?6 Lat leg - Ankle 14  R Superficial peroneal - Ankle     Lat leg Ankle 3.9 ?4.4 5 ?6 Lat leg - Ankle 14             F  Wave    Nerve F Lat Ref.   ms ms  L Tibial - AH 58.8 ?56.0  R Tibial - AH 61.0 ?56.0         EMG Summary Table    Spontaneous MUAP Recruitment  Muscle IA Fib PSW Fasc Other Amp Dur. Poly Pattern  R. Vastus medialis Normal None None None _______ Normal Normal Normal Normal  R. Tibialis anterior Normal None None None _______ Normal Normal Normal Normal  R. Gastrocnemius (Medial head) Normal None None None _______ Normal Normal Normal Normal  R. Biceps femoris (long head) Normal None None None _______ Normal Normal Normal Normal  R. Gluteus maximus Normal None None None _______ Normal Normal Normal Normal  R. Gluteus medius Normal None None None _______ Normal Normal Normal Normal  R. Lumbar paraspinals (low) Normal None None None _______ Normal Normal Normal Normal

## 2020-11-29 NOTE — Progress Notes (Signed)
See procedure note.

## 2020-11-30 NOTE — Telephone Encounter (Signed)
Pt. states he is having trouble getting his oximeter with [employer] & also he would like his NCS results. Please advise.

## 2020-12-02 NOTE — Procedures (Signed)
Full Name: Cody Gonzalez Gender: Male MRN #: [id number] Date of Birth: 07/18/1946    Visit Date: 11/29/2020 10:16 Age: 75 Years Examining Physician: Sarina Ill, MD  Referring Physician: Larey Seat, MD    History: Low back pain Summary: Nerve conduction studies were performed on the lower extremities.  EMG needle exam was performed on the right lower extremity.  The left peroneal motor nerve showed decreased amplitude (0.8 mV, normal greater than 2).  The right peroneal motor nerve showed decreased amplitude (1.5 mV, normal greater than 2).  The left superficial peroneal sensory nerve showed decreased amplitude (4 V, normal greater than 6).  The right superficial peroneal sensory nerve showed decreased amplitude (5 V, normal greater than 6).  The left tibial F wave showed delayed latency (58.8 ms, normal less than 56).  The right tibial F wave showed delayed latency (61 ms, normal less than 56).All remaining nerves (as indicated in the following tables) were within normal limits.  All muscles (as indicated in the following tables) were within normal limits.     Conclusion: Decreased sensorimotor peroneal conductions with delayed tibial F waves likely due to axonal peripheral polyneuropathy.  No denervation was seen on EMG needle study to suggest acute or ongoing radiculopathy.     ------------------------------- Sarina Ill, M.D.  [facility] [address], [place], [place] [address] Tel: [phone] Fax: [phone]  Verbal informed consent was obtained from the patient, patient was informed of potential risk of procedure, including bruising, bleeding, hematoma formation, infection, muscle weakness, muscle pain, numbness, among others.        Dundy    Nerve / Sites Muscle Latency Ref. Amplitude Ref. Rel Amp Segments Distance Velocity Ref. Area    ms ms mV mV %  cm m/s m/s mVms  L Peroneal - EDB     Ankle EDB 6.5 ?6.5 0.8 ?2.0 100  Ankle - EDB 9   3.4     Fib head EDB 14.4  0.8  108 Fib head - Ankle 28 35 ?44 3.4     Pop fossa EDB 17.3  0.8  92.9 Pop fossa - Fib head 10 34 ?44 3.3         Pop fossa - Ankle      R Peroneal - EDB     Ankle EDB 6.1 ?6.5 1.5 ?2.0 100 Ankle - EDB 9   4.8     Fib head EDB 14.6  1.2  79.5 Fib head - Ankle 29 34 ?44 2.8     Pop fossa EDB 17.6  1.3  110 Pop fossa - Fib head 10 34 ?44 3.0         Pop fossa - Ankle      L Tibial - AH     Ankle AH 5.2 ?5.8 10.5 ?4.0 100 Ankle - AH 9   24.6     Pop fossa AH 14.3  5.4  50.8 Pop fossa - Ankle 37 41 ?41 31.0  R Tibial - AH     Ankle AH 5.0 ?5.8 9.7 ?4.0 100 Ankle - AH 9   21.4     Pop fossa AH 14.6  7.6  78.3 Pop fossa - Ankle 39 41 ?41 17.9             SNC    Nerve / Sites Rec. Site Peak Lat Ref.  Amp Ref. Segments Distance    ms ms V V  cm  L Sural -  Ankle (Calf)     Calf Ankle 4.3 ?4.4 7 ?6 Calf - Ankle 14  R Sural - Ankle (Calf)     Calf Ankle 4.4 ?4.4 12 ?6 Calf - Ankle 14  L Superficial peroneal - Ankle     Lat leg Ankle 4.4 ?4.4 4 ?6 Lat leg - Ankle 14  R Superficial peroneal - Ankle     Lat leg Ankle 3.9 ?4.4 5 ?6 Lat leg - Ankle 14             F  Wave    Nerve F Lat Ref.   ms ms  L Tibial - AH 58.8 ?56.0  R Tibial - AH 61.0 ?56.0         EMG Summary Table    Spontaneous MUAP Recruitment  Muscle IA Fib PSW Fasc Other Amp Dur. Poly Pattern  R. Vastus medialis Normal None None None _______ Normal Normal Normal Normal  R. Tibialis anterior Normal None None None _______ Normal Normal Normal Normal  R. Gastrocnemius (Medial head) Normal None None None _______ Normal Normal Normal Normal  R. Biceps femoris (long head) Normal None None None _______ Normal Normal Normal Normal  R. Gluteus maximus Normal None None None _______ Normal Normal Normal Normal  R. Gluteus medius Normal None None None _______ Normal Normal Normal Normal  R. Lumbar paraspinals (low) Normal None None None _______ Normal Normal Normal Normal      

## 2020-12-03 NOTE — Telephone Encounter (Signed)
Message from RN was read to pt. & he verbalized understanding.

## 2020-12-03 NOTE — Telephone Encounter (Signed)
As of this moment, Dr Dohmeier has not reviewed the nerve conduction results for the patient. As soon as I have that information I will be in touch to provide to the patient. I have resent a message to [facility]) checking on the status but as I mentioned to the patient they have to get insurance auth for the testing which can take 10-14 business days. The order was initially sent on 11/22/20. The company should be in touch soon to get him set up with the overnight oximetry study. Pt can also check on status by calling the company, [phone].

## 2020-12-04 ENCOUNTER — Encounter: Payer: Self-pay | Admitting: Neurology

## 2020-12-04 ENCOUNTER — Telehealth: Payer: Self-pay | Admitting: Neurology

## 2020-12-04 NOTE — Telephone Encounter (Signed)
Conclusion: Decreased sensorimotor peroneal conductions with delayed tibial F waves likely due to axonal peripheral polyneuropathy.  No denervation was seen on EMG needle study to suggest acute or ongoing radiculopathy.   There was no evidence of a pinched nerve or nerve root problem. Dr Jaynee Eagles used the term polyneuropathy for a delayed [place]-Velcocity, part can be age related and part neuropathic.  No interruption of a nerve by compression , due to trauma or fall. CD

## 2020-12-04 NOTE — Telephone Encounter (Signed)
Wife is asking for a call with all results for the NCV/EMG.  The message from [facility] on final result note was relayed to wife.  She said she will wait for call with results.  She states she is anxious because she see's some results on Mychart.  Wife was again reminded she will be called once Cody Gonzalez, [place] has all information to relay.

## 2020-12-04 NOTE — Telephone Encounter (Signed)
Call the wife and the patient, was able to review the results from the nerve conduction study.  Patient's wife states the gait is not getting any better and questions what are the next steps.  I was able to schedule a follow-up visit on February 9 at 8:30 in the morning for the patient.  At this visit they can discuss recent test results and answer any questions they may have.  They were appreciative for the call and had no further questions at this time.  Per Dr Reita Chard regards to the NCV/EMG "Conclusion:Decreased sensorimotor peroneal conductions with delayed tibial F waves likely due to axonal peripheral polyneuropathy. No denervation was seen on EMG needle study to suggest acute or ongoing radiculopathy. There was no evidence of a pinched nerve or nerve root problem. Dr Jaynee Eagles used the term polyneuropathy for a delayed [place]-Velcocity, part can be age related and part neuropathic.  No interruption of a nerve by compression , due to trauma or fall

## 2020-12-05 ENCOUNTER — Telehealth: Payer: Self-pay | Admitting: Neurology

## 2020-12-05 ENCOUNTER — Encounter: Payer: Self-pay | Admitting: Neurology

## 2020-12-05 NOTE — Telephone Encounter (Signed)
Called the wife back.  Wife states patient was on a walk midmorning and his right leg for the most part had given out.  He was unable to get back to walking and had to call his wife to come and get him.  He has movement in his right leg currently.  She is asking if there is anything that can be done that may help.  Informed her unfortunately he really just needs to take it easy for now, I know he wants to remain active but resting would be beneficial for him as well.  She asked if a medication or if NSAIDs would be appropriate.  I asked if the patient is having pain or inflammation in his lower right back area, and she states the only time he has pain is when he has moments when his strength declines.  Advised I would still run this by Dr. Brett Fairy to get her thoughts and opinion and see if she has a recommendation.  Informed I would call back if she had anything to add.  Informed the wife that completing therapy is most likely going to be the best option.  Patient has already completed physical therapy which was not helpful before which is when they decided to put a consult into neurology.  I will continue to monitor if an opening comes available sooner than the scheduled February 9 appointment.  She was appreciative for the call back.

## 2020-12-05 NOTE — Telephone Encounter (Signed)
The NCV and EMG test was positive for some degree of peripheral neuropathy, but no impingement or peripheral injury to the lower extremities' nerves were noted.   Sudden buckling of the knee unexplained, appears unrelated to nerve.

## 2020-12-05 NOTE — Telephone Encounter (Signed)
Pt.'s wife [name] is on [place]. She states husband's right leg was getting ready to give out on him during a walk & she is requesting a call back from RN.

## 2020-12-08 ENCOUNTER — Encounter: Payer: Self-pay | Admitting: Neurology

## 2020-12-11 ENCOUNTER — Ambulatory Visit: Payer: Medicare Other | Admitting: Neurology

## 2020-12-12 ENCOUNTER — Ambulatory Visit (INDEPENDENT_AMBULATORY_CARE_PROVIDER_SITE_OTHER): Payer: Medicare Other | Admitting: Neurology

## 2020-12-12 ENCOUNTER — Encounter: Payer: Self-pay | Admitting: Neurology

## 2020-12-12 VITALS — BP 181/77 | HR 81 | Ht 67.0 in | Wt 168.0 lb

## 2020-12-12 DIAGNOSIS — M48061 Spinal stenosis, lumbar region without neurogenic claudication: Secondary | ICD-10-CM

## 2020-12-12 DIAGNOSIS — G3184 Mild cognitive impairment, so stated: Secondary | ICD-10-CM | POA: Diagnosis not present

## 2020-12-12 DIAGNOSIS — S069X1S Unspecified intracranial injury with loss of consciousness of 30 minutes or less, sequela: Secondary | ICD-10-CM | POA: Diagnosis not present

## 2020-12-12 DIAGNOSIS — R2681 Unsteadiness on feet: Secondary | ICD-10-CM

## 2020-12-12 NOTE — Patient Instructions (Addendum)
There are well-accepted and sensible ways to reduce risk for Alzheimers disease and other degenerative brain disorders .  Exercise Daily Walk A daily 20 minute walk should be part of your routine. Disease related apathy can be a significant roadblock to exercise and the only way to overcome this is to make it a daily routine and perhaps have a reward at the end (something your loved one loves to eat or drink perhaps) or a personal trainer coming to the home can also be very useful. Most importantly, the patient is much more likely to exercise if the caregiver / spouse does it with him/her. In general a structured, repetitive schedule is best.  General Health: Any diseases which effect your body will effect your brain such as a pneumonia, urinary infection, blood clot, heart attack or stroke. Keep contact with your primary care doctor for regular follow ups.  Sleep. A good nights sleep is healthy for the brain. Seven hours is recommended. If you have insomnia or poor sleep habits we can give you some instructions. If you have sleep apnea wear your mask.  Diet: Eating a heart healthy diet is also a good idea; fish and poultry instead of red meat, nuts (mostly non-peanuts), vegetables, fruits, olive oil or canola oil (instead of butter), minimal salt (use other spices to flavor foods), whole grain rice, bread, cereal and pasta and wine in moderation.Research is now showing that the MIND diet, which is a combination of The Mediterranean diet and the DASH diet, is beneficial for cognitive processing and longevity. Information about this diet can be found in The MIND Diet, a book by Maggie Moon, MS, RDN, and online at https://www.healthline.com/nutrition/mind-diet  Finances, Power of Attorney and Advance Directives: You should consider putting legal safeguards in place with regard to financial and medical decision making. While the spouse always has power of attorney for medical and financial issues in the  absence of any form, you should consider what you want in case the spouse / caregiver is no longer around or capable of making decisions.   The Alzheimers Association Position on Disease Prevention  Can Alzheimer's be prevented? It's a question that continues to intrigue researchers and fuel new investigations. There are no clear-cut answers yet -- partially due to the need for more large-scale studies in diverse populations -- but promising research is under way. The Alzheimer's Association is leading the worldwide effort to find a treatment for Alzheimer's, delay its onset and prevent it from developing.   What causes Alzheimer's? Experts agree that in the vast majority of cases, Alzheimer's, like other common chronic conditions, probably develops as a result of complex interactions among multiple factors, including age, genetics, environment, lifestyle and coexisting medical conditions. Although some risk factors -- such as age or genes -- cannot be changed, other risk factors -- such as high blood pressure and lack of exercise -- usually can be changed to help reduce risk. Research in these areas may lead to new ways to detect those at highest risk.  Prevention studies A small percentage of people with Alzheimer's disease (less than 1 percent) have an early-onset type associated with genetic mutations. Individuals who have these genetic mutations are guaranteed to develop the disease. An ongoing clinical trial conducted by the [facility] ([facility]), is testing whether antibodies to beta-amyloid can reduce the accumulation of beta-amyloid plaque in the brains of people with such genetic mutations and thereby reduce, delay or prevent symptoms. Participants in the trial are receiving antibodies (  or placebo) before they develop symptoms, and the development of beta-amyloid plaques is being monitored by brain scans and other tests.  Another clinical trial, known as the A4 trial  (Anti-Amyloid Treatment in Asymptomatic Alzheimer's), is testing whether antibodies to beta-amyloid can reduce the risk of Alzheimer's disease in older people (ages 31 to 22) at high risk for the disease. The A4 trial is being conducted by the Alzheimer's Disease Cooperative Study.  Though research is still evolving, evidence is strong that people can reduce their risk by making key lifestyle changes, including participating in regular activity and maintaining good heart health. Based on this research, the Alzheimer's Association offers 10 Ways to Love Your Brain -- a collection of tips that can reduce the risk of cognitive decline.  Heart-head connection  New research shows there are things we can do to reduce the risk of mild cognitive impairment and dementia.  Several conditions known to increase the risk of cardiovascular disease -- such as high blood pressure, diabetes and high cholesterol -- also increase the risk of developing Alzheimer's. Some autopsy studies show that as many as 60 percent of individuals with Alzheimer's disease also have cardiovascular disease.  A longstanding question is why some people develop hallmark Alzheimer's plaques and tangles but do not develop the symptoms of Alzheimer's. Vascular disease may help researchers eventually find an answer. Some autopsy studies suggest that plaques and tangles may be present in the brain without causing symptoms of cognitive decline unless the brain also shows evidence of vascular disease. More research is needed to better understand the link between vascular health and Alzheimer's.  Physical exercise and diet Regular physical exercise may be a beneficial strategy to lower the risk of Alzheimer's and vascular dementia. Exercise may directly benefit brain cells by increasing blood and oxygen flow in the brain. Because of its known cardiovascular benefits, a medically approved exercise program is a valuable part of any overall wellness  plan.  Current evidence suggests that heart-healthy eating may also help protect the brain. Heart-healthy eating includes limiting the intake of sugar and saturated fats and making sure to eat plenty of fruits, vegetables, and whole grains. No one diet is best. Two diets that have been studied and may be beneficial are the DASH (Dietary Approaches to Stop Hypertension) diet and the Mediterranean diet. The DASH diet emphasizes vegetables, fruits and fat-free or low-fat dairy products; includes whole grains, fish, poultry, beans, seeds, nuts and vegetable oils; and limits sodium, sweets, sugary beverages and red meats. A Mediterranean diet includes relatively little red meat and emphasizes whole grains, fruits and vegetables, fish and shellfish, and nuts, olive oil and other healthy fats.  Social connections and intellectual activity A number of studies indicate that maintaining strong social connections and keeping mentally active as we age might lower the risk of cognitive decline and Alzheimer's. Experts are not certain about the reason for this association. It may be due to direct mechanisms through which social and mental stimulation strengthen connections between nerve cells in the brain.  Head trauma There appears to be a strong link between future risk of Alzheimer's and serious head trauma, especially when injury involves loss of consciousness. You can help reduce your risk of Alzheimer's by protecting your head. . Wear a seat belt . Use a helmet when participating in sports . "Fall-proof" your home .  What you can do now While research is not yet conclusive, certain lifestyle choices, such as physical activity and diet, may help support brain  health and prevent Alzheimer's. Many of these lifestyle changes have been shown to lower the risk of other diseases, like heart disease and diabetes, which have been linked to Alzheimer's. With few drawbacks and plenty of known benefits, healthy lifestyle  choices can improve your health and possibly protect your brain.  Learn more about brain health. You can help increase our knowledge by considering participation in a clinical study. Our free clinical trial matching services, TrialMatch, can help you find clinical trials in your area that are seeking volunteers.  Understanding prevention research Here are some things to keep in mind about the research underlying much of our current knowledge about possible prevention: . Insights about potentially modifiable risk factors apply to large population groups, not to individuals. Studies can show that factor X is associated with outcome Y, but cannot guarantee that any specific person will have that outcome. As a result, you can "do everything right" and still have a serious health problem or "do everything wrong" and live to be 100. . Much of our current evidence comes from large epidemiological studies such as the [place] Aging Study, the Nurses' Health Study, the Adult Changes in Thought Study and the [employer] These studies explore pre-existing behaviors and use statistical methods to relate those behaviors to health outcomes. This type of study can show an "association" between a factor and an outcome but cannot "prove" cause and effect. This is why we describe evidence based on these studies with such language as "suggests," "may show," "might protect," and "is associated with." . The gold standard for showing cause and effect is a clinical trial in which participants are randomly assigned to a prevention or risk management strategy or a control group. Researchers follow the two groups over time to see if their outcomes differ significantly. . It is unlikely that some prevention or risk management strategies will ever be tested in randomized trials for ethical or practical reasons. One example is exercise. Definitively testing the impact of exercise on Alzheimer's risk would require a huge  trial enrolling thousands of people and following them for many years. The expense and logistics of such a trial would be prohibitive, and it would require some people to go without exercise, a known health benefit.     Leslie Dales and Winn neurological surgery (7th ed., pp. [phone]). [place], [place]: Elsevier.">  Spinal Stenosis  Spinal stenosis happens when the spinal canal gets smaller. The spinal canal is the space between the bones of your spine (vertebrae). As the canal gets smaller, the nerves that pass that part of the spine are pressed. This causes pain. Spinal stenosis can affect your neck, upper back, or lower back. What are the causes? This condition is caused by parts of bone that push into your spinal canal. This problem may start before birth. If it occurs after birth, the cause may be:  Breakdown of bones of your spine. This normally starts around 76 years of age.  Injury to your spine.  Tumors in your spine.  A buildup of calcium in your spine. What increases the risk? You are more likely to develop this condition if:  You are older than age 24.  You were born with a problem in your spine, such as a curved spine (scoliosis).  You have arthritis. This is disease of your joints. What are the signs or symptoms? Common symptoms of this condition include:  Pain in your neck or back. The pain may be worse when you stand or walk.  Problems with your legs. A leg may lose feeling, tingle, or turn hot or cold.  Pain that goes from your butt down to your lower leg (sciatica).  Falling a lot.  Slapping your foot down when you walk. This weakens muscles. Severe symptoms of this condition include:  Problems pooping or peeing.  Trouble having sex.  Loss of feeling in your leg.  Being unable to walk. Sometimes there are no symptoms. How is this treated? To treat pain and manage symptoms, you may be asked to:  Practice sitting and standing up straight. This is good  posture.  Exercise.  Lose weight, if needed.  Take medicines or get shots.  Support your back with a corset or a brace. In some cases, you may need to have surgery. Follow these instructions at home: Managing pain, stiffness, and swelling  Stand and sit up straight. If you have a brace or a corset, wear it as told.  Keep a healthy weight. Talk with your doctor if you need help losing weight.  If told, put heat on the affected area. Do this as often as told by your doctor. Use the heat source that your doctor recommends, such as a moist heat pack or a heating pad. ? Place a towel between your skin and the heat source. ? Leave the heat on for 20-30 minutes. ? Take off the heat if your skin turns bright red. This is very important if you are unable to feel pain, heat, or cold. You have a greater risk of getting burned.   Activity  Do exercises as told by your doctor.  Do not do activities that cause pain. Ask your doctor what activities are safe for you.  Do not lift anything that is heavier than 10 lb (4.5 kg), or the limit that you are told by your doctor.  Return to your normal activities as told by your doctor. Ask your doctor what activities are safe for you. General instructions  Take over-the-counter and prescription medicines only as told by your doctor.  Do not use any products that contain nicotine or tobacco, such as cigarettes, e-cigarettes, and chewing tobacco. If you need help quitting, ask your doctor.  Eat a healthy diet. Eat a lot of: ? Fruits. ? Vegetables. ? Whole grains. ? Low-fat (lean) protein.  Keep all follow-up visits as told by your doctor. This is important. Contact a doctor if:  Your symptoms do not get better.  Your symptoms get worse.  You have a fever. Get help right away if:  You have new pain or very bad pain in your neck or upper back.  You have very bad pain, and medicine does not help.  You have a very bad headache.  You are  dizzy.  You do not see well.  You vomit or feel like you may vomit.  You have these things in your back or legs: ? New or worse loss of feeling. ? New or worse tingling.  Your arm or leg: ? Hurts or swells. ? Turns red. ? Feels warm. Summary  Spinal stenosis happens when the space between the bones of the spine gets smaller. The small space puts pressure on the nerves in your spine.  This condition may start before birth. Breakdown of bones, injuries, or tumors can cause this condition after birth.  Spinal stenosis can cause pain in the neck, back, legs, or butt. A leg may lose feeling, tingle, or turn hot or cold.  Treatment for this condition  focuses on lessening your pain and other symptoms. This information is not intended to replace advice given to you by your health care provider. Make sure you discuss any questions you have with your health care provider. Document Revised: 08/18/2019 Document Reviewed: 08/18/2019 Elsevier Patient Education  2021 [employer].
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
## 2020-12-17 ENCOUNTER — Telehealth: Payer: Self-pay | Admitting: Neurology

## 2020-12-17 NOTE — Telephone Encounter (Signed)
Overnight pulse oximetry- 12-09-2020:   This study was performed at the patient's home in [place] the study was dated 09 December 2020 with a duration of 8 hours 38 minutes.  The patient had no prolonged oxygen desaturation his total time of oxygen saturation at or below 89% was 4 minutes and 20 seconds.  He would not qualify by Medicare criteria to have oxygen supplementation.  Oxygen nadir was 86% pulse rate varied between 47 and 87 bpm.  There was a trend towards bradycardia with a heart rate being under 60 bpm for 7 hours and 32 minutes of recording time.  The oxygen desaturation index per hour of sleep was 15.23.  Based on this overnight pulse oximetry performed on CPAP and on room air the patient would not qualify for oxygen supplementation.    Sincerely, Larey Seat, MD.  Cc Jillyn Ledger, NP and PCP for this patient.

## 2020-12-17 NOTE — Telephone Encounter (Signed)
Pt already notified through [employer]

## 2021-01-07 ENCOUNTER — Ambulatory Visit: Payer: Medicare Other | Admitting: Adult Health

## 2021-02-27 ENCOUNTER — Other Ambulatory Visit: Payer: Self-pay

## 2021-02-27 ENCOUNTER — Ambulatory Visit: Payer: Medicare Other | Attending: Internal Medicine

## 2021-02-27 ENCOUNTER — Other Ambulatory Visit (HOSPITAL_BASED_OUTPATIENT_CLINIC_OR_DEPARTMENT_OTHER): Payer: Self-pay

## 2021-02-27 DIAGNOSIS — Z23 Encounter for immunization: Secondary | ICD-10-CM

## 2021-02-27 MED ORDER — PFIZER-BIONT COVID-19 VAC-TRIS 30 MCG/0.3ML IM SUSP
INTRAMUSCULAR | 0 refills | Status: DC
  Filled 2021-02-27: qty 0.3, 1d supply, fill #0

## 2021-02-27 NOTE — Progress Notes (Signed)
   Covid-19 Vaccination Clinic  Name:  Cody Gonzalez    MRN: [id number] DOB: 06-20-1946  02/27/2021  Mr. Duvall was observed post Covid-19 immunization for 15 minutes without incident. He was provided with Vaccine Information Sheet and instruction to access the V-Safe system.   Mr. Heyward was instructed to call 911 with any severe reactions post vaccine: Marland Kitchen Difficulty breathing  . Swelling of face and throat  . A fast heartbeat  . A bad rash all over body  . Dizziness and weakness   Immunizations Administered    Name Date Dose VIS Date Route   PFIZER Comrnaty(Gray TOP) Covid-19 Vaccine 02/27/2021  1:13 PM 0.3 mL 10/11/2020 Intramuscular   Manufacturer: [employer]   Lot: [id number]   NDC: [phone]

## 2021-03-17 ENCOUNTER — Encounter: Payer: Self-pay | Admitting: Neurology

## 2021-03-19 ENCOUNTER — Ambulatory Visit (INDEPENDENT_AMBULATORY_CARE_PROVIDER_SITE_OTHER): Payer: Medicare Other | Admitting: Neurology

## 2021-03-19 ENCOUNTER — Encounter: Payer: Self-pay | Admitting: Neurology

## 2021-03-19 DIAGNOSIS — Z9989 Dependence on other enabling machines and devices: Secondary | ICD-10-CM | POA: Diagnosis not present

## 2021-03-19 DIAGNOSIS — G4733 Obstructive sleep apnea (adult) (pediatric): Secondary | ICD-10-CM | POA: Diagnosis not present

## 2021-03-19 DIAGNOSIS — Z981 Arthrodesis status: Secondary | ICD-10-CM

## 2021-03-19 DIAGNOSIS — S060X1S Concussion with loss of consciousness of 30 minutes or less, sequela: Secondary | ICD-10-CM
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
## 2021-03-29 ENCOUNTER — Encounter: Payer: Self-pay | Admitting: Neurology

## 2021-04-19 ENCOUNTER — Other Ambulatory Visit: Payer: Self-pay

## 2021-04-19 ENCOUNTER — Encounter: Payer: Self-pay | Admitting: Podiatry

## 2021-04-19 ENCOUNTER — Ambulatory Visit (INDEPENDENT_AMBULATORY_CARE_PROVIDER_SITE_OTHER): Payer: Medicare Other | Admitting: Podiatry

## 2021-04-19 DIAGNOSIS — M79675 Pain in left toe(s): Secondary | ICD-10-CM | POA: Diagnosis not present

## 2021-04-19 DIAGNOSIS — B351 Tinea unguium: Secondary | ICD-10-CM | POA: Diagnosis not present

## 2021-04-19 DIAGNOSIS — M79674 Pain in right toe(s): Secondary | ICD-10-CM | POA: Diagnosis not present

## 2021-04-19 NOTE — Progress Notes (Signed)
This patient returns to the office for evaluation and treatment of long thick painful nails .  This patient is unable to trim his own nails since the patient cannot reach his feet.  Patient says the nails are painful walking and wearing his shoes.  He returns for preventive foot care services.  General Appearance  Alert, conversant and in no acute stress.  Vascular  Dorsalis pedis and posterior tibial  pulses are palpable  bilaterally.  Capillary return is within normal limits  bilaterally. Temperature is within normal limits  bilaterally.  Neurologic  Senn-Weinstein monofilament wire test within normal limits  bilaterally. Muscle power within normal limits bilaterally.  Nails Thick disfigured discolored nails with subungual debris  from hallux to fifth toes bilaterally. No evidence of bacterial infection or drainage bilaterally.  Orthopedic  No limitations of motion  feet .  No crepitus or effusions noted.  No bony pathology or digital deformities noted.  Skin  normotropic skin with no porokeratosis noted bilaterally.  No signs of infections or ulcers noted.     Onychomycosis  Pain in toes right foot  Pain in toes left foot  Debridement  of nails  1-5  B/L with a nail nipper.  Nails were then filed using a dremel tool with no incidents.    RTC  prn   Gardiner Barefoot DPM

## 2021-04-29 ENCOUNTER — Other Ambulatory Visit: Payer: Self-pay | Admitting: Neurosurgery

## 2021-05-23 NOTE — Progress Notes (Signed)
Surgical Instructions    Your procedure is scheduled on Tuesday, July 26th, 2022 .  Report to [facility] Main Entrance "A" at 06:00 A.M., then check in with the Admitting office.  Call this number if you have problems the morning of surgery:  [phone]   If you have any questions prior to your surgery date call [phone]: Open Monday-Friday 8am-4pm    Remember:  Do not eat or drink after midnight the night before your surgery    Take these medicines the morning of surgery with A SIP OF WATER:  atorvastatin (LIPITOR)  As of today, STOP taking any Aspirin (unless otherwise instructed by your surgeon) Aleve, Naproxen, Ibuprofen, Motrin, Advil, Goody's, BC's, all herbal medications, fish oil, and all vitamins.          Do not wear jewelry  Do not wear lotions, powders, colognes, or deodorant. Men may shave face and neck. Do not bring valuables to the hospital. DO Not wear nail polish, gel polish, artificial nails, or any other type of covering on natural nails including finger and toenails. If patients have artificial nails, gel coating, etc. that need to be removed by a nail salon please have this removed prior to surgery or surgery may need to be canceled/delayed if the surgeon/ anesthesia feels like the patient is unable to be adequately monitored.             [facility] is not responsible for any belongings or valuables.  Do NOT Smoke (Tobacco/Vaping) or drink Alcohol 24 hours prior to your procedure If you use a CPAP at night, you may bring all equipment for your overnight stay.   Contacts, glasses, dentures or bridgework may not be worn into surgery, please bring cases for these belongings   For patients admitted to the hospital, discharge time will be determined by your treatment team.   Patients discharged the day of surgery will not be allowed to drive home, and someone needs to stay with them for 24 hours.  ONLY 1 SUPPORT PERSON MAY BE PRESENT WHILE YOU ARE IN  SURGERY. IF YOU ARE TO BE ADMITTED ONCE YOU ARE IN YOUR ROOM YOU WILL BE ALLOWED TWO (2) VISITORS.  Minor children may have two parents present. Special consideration for safety and communication needs will be reviewed on a case by case basis.  Special instructions:    Oral Hygiene is also important to reduce your risk of infection.  Remember - BRUSH YOUR TEETH THE MORNING OF SURGERY WITH YOUR REGULAR TOOTHPASTE   [place]- Preparing For Surgery  Before surgery, you can play an important role. Because skin is not sterile, your skin needs to be as free of germs as possible. You can reduce the number of germs on your skin by washing with CHG (chlorahexidine gluconate) Soap before surgery.  CHG is an antiseptic cleaner which kills germs and bonds with the skin to continue killing germs even after washing.     Please do not use if you have an allergy to CHG or antibacterial soaps. If your skin becomes reddened/irritated stop using the CHG.  Do not shave (including legs and underarms) for at least 48 hours prior to first CHG shower. It is OK to shave your face.  Please follow these instructions carefully.     Shower the NIGHT BEFORE SURGERY and the MORNING OF SURGERY with CHG Soap.   If you chose to wash your hair, wash your hair first as usual with your normal shampoo. After you shampoo,  rinse your hair and body thoroughly to remove the shampoo.  Then [employer] and genitals (private parts) with your normal soap and rinse thoroughly to remove soap.  After that Use CHG Soap as you would any other liquid soap. You can apply CHG directly to the skin and wash gently with a scrungie or a clean washcloth.   Apply the CHG Soap to your body ONLY FROM THE NECK DOWN.  Do not use on open wounds or open sores. Avoid contact with your eyes, ears, mouth and genitals (private parts). Wash Face and genitals (private parts)  with your normal soap.   Wash thoroughly, paying special attention to the area where  your surgery will be performed.  Thoroughly rinse your body with warm water from the neck down.  DO NOT shower/wash with your normal soap after using and rinsing off the CHG Soap.  Pat yourself dry with a CLEAN TOWEL.  Wear CLEAN PAJAMAS to bed the night before surgery  Place CLEAN SHEETS on your bed the night before your surgery  DO NOT SLEEP WITH PETS.   Day of Surgery:  Take a shower with CHG soap. Wear Clean/Comfortable clothing the morning of surgery Do not apply any deodorants/lotions.   Remember to brush your teeth WITH YOUR REGULAR TOOTHPASTE.   Please read over the following fact sheets that you were given.

## 2021-05-24 ENCOUNTER — Encounter (HOSPITAL_COMMUNITY): Payer: Self-pay

## 2021-05-24 ENCOUNTER — Encounter (HOSPITAL_COMMUNITY)
Admission: RE | Admit: 2021-05-24 | Discharge: 2021-05-24 | Disposition: A | Discharge status: Home / Self Care | Payer: Medicare Other | Source: Ambulatory Visit | Attending: Neurosurgery | Admitting: Neurosurgery

## 2021-05-24 ENCOUNTER — Other Ambulatory Visit: Payer: Self-pay

## 2021-05-24 DIAGNOSIS — Z01818 Encounter for other preprocedural examination: Secondary | ICD-10-CM | POA: Diagnosis present

## 2021-05-24 DIAGNOSIS — Z20822 Contact with and (suspected) exposure to covid-19: Secondary | ICD-10-CM | POA: Insufficient documentation

## 2021-05-24 DIAGNOSIS — G4733 Obstructive sleep apnea (adult) (pediatric): Secondary | ICD-10-CM | POA: Diagnosis not present

## 2021-05-24 HISTORY — DX: Anemia, unspecified: D64.9

## 2021-05-24 LAB — TYPE AND SCREEN
ABO/RH(D): O POS
Antibody Screen: NEGATIVE

## 2021-05-24 LAB — CBC WITH DIFFERENTIAL/PLATELET
Abs Immature Granulocytes: 0.01 10*3/uL (ref 0.00–0.07)
Basophils Absolute: 0 10*3/uL (ref 0.0–0.1)
Basophils Relative: 1 %
Eosinophils Absolute: 0.1 10*3/uL (ref 0.0–0.5)
Eosinophils Relative: 1 %
HCT: 33.8 % — ABNORMAL LOW (ref 39.0–52.0)
Hemoglobin: 11.8 g/dL — ABNORMAL LOW (ref 13.0–17.0)
Immature Granulocytes: 0 %
Lymphocytes Relative: 11 %
Lymphs Abs: 0.4 10*3/uL — ABNORMAL LOW (ref 0.7–4.0)
MCH: 34.9 pg — ABNORMAL HIGH (ref 26.0–34.0)
MCHC: 34.9 g/dL (ref 30.0–36.0)
MCV: 100 fL (ref 80.0–100.0)
Monocytes Absolute: 0.5 10*3/uL (ref 0.1–1.0)
Monocytes Relative: 13 %
Neutro Abs: 2.8 10*3/uL (ref 1.7–7.7)
Neutrophils Relative %: 74 %
Platelets: 214 10*3/uL (ref 150–400)
RBC: 3.38 MIL/uL — ABNORMAL LOW (ref 4.22–5.81)
RDW: 11.8 % (ref 11.5–15.5)
WBC: 3.8 10*3/uL — ABNORMAL LOW (ref 4.0–10.5)
nRBC: 0 % (ref 0.0–0.2)

## 2021-05-24 LAB — COMPREHENSIVE METABOLIC PANEL
ALT: 22 U/L (ref 0–44)
AST: 25 U/L (ref 15–41)
Albumin: 4.1 g/dL (ref 3.5–5.0)
Alkaline Phosphatase: 60 U/L (ref 38–126)
Anion gap: 6 (ref 5–15)
BUN: 14 mg/dL (ref 8–23)
CO2: 29 mmol/L (ref 22–32)
Calcium: 9.1 mg/dL (ref 8.9–10.3)
Chloride: 92 mmol/L — ABNORMAL LOW (ref 98–111)
Creatinine, Ser: 0.76 mg/dL (ref 0.61–1.24)
GFR, Estimated: >60 mL/min (ref >60)
Glucose, Bld: 97 mg/dL (ref 70–99)
Potassium: 3.8 mmol/L (ref 3.5–5.1)
Sodium: 127 mmol/L — ABNORMAL LOW (ref 135–145)
Total Bilirubin: 1.5 mg/dL — ABNORMAL HIGH (ref 0.3–1.2)
Total Protein: 6.8 g/dL (ref 6.5–8.1)

## 2021-05-24 LAB — SURGICAL PCR SCREEN
MRSA, PCR: NEGATIVE
Staphylococcus aureus: NEGATIVE

## 2021-05-24 LAB — SARS CORONAVIRUS 2 (TAT 6-24 HRS): SARS Coronavirus 2: NEGATIVE

## 2021-05-24 NOTE — Progress Notes (Addendum)
PCP: Jillyn Ledger, FNP Cardiologist: denies  EKG: 05/24/21 CXR: na ECHO: denies Stress Test: denies Cardiac Cath: denies  Fasting Blood Sugar- na Checks Blood Sugar__na_ times a day  OSA/CPAP: Yes, wears nightly  ASA/Blood Thinner: No  Covid test 05/24/21 at PAT  Anesthesia Review: Yes, abnormal labs  Patient denies shortness of breath, fever, cough, and chest pain at PAT appointment.  Patient verbalized understanding of instructions provided today at the PAT appointment.  Patient asked to review instructions at home and day of surgery.

## 2021-05-27 NOTE — Progress Notes (Signed)
Anesthesia Chart Review:  Preop labs notable for hyponatremia with sodium 127.  Remainder labs unremarkable.  Review of PCP records indicates the patient has chronic hyponatremia.  He was previously recommended sodium tablets, however could not tolerate this due to increase in blood pressure and these were discontinued when last seen by PCP on 05/13/2021.  Review of labs from PCP office over the past 2 years shows a sodium range 126-129.  Blood pressure noted to be elevated previously testing appointment, 178/75.  Review of PCP notes shows this to be generally under better control with systolics in the 0000000.  OSA on CPAP, wears nightly.   EKG 05/24/2021: Normal sinus rhythm.  Rate 61. Abnormal QRS-T angle, consider primary T wave abnormality. No significant change since last tracing   Wynonia Musty [name] Short Stay Center/Anesthesiology Phone [phone] 05/27/2021 11:40 AM

## 2021-05-27 NOTE — Anesthesia Preprocedure Evaluation (Addendum)
Anesthesia Evaluation  Patient identified by MRN, date of birth, ID band Patient awake    Reviewed: Allergy & Precautions, NPO status , Patient's Chart, lab work & pertinent test results  Airway Mallampati: II  TM Distance: >3 FB Neck ROM: Full    Dental no notable dental hx.    Pulmonary neg pulmonary ROS, sleep apnea and Continuous Positive Airway Pressure Ventilation ,    Pulmonary exam normal breath sounds clear to auscultation       Cardiovascular hypertension, Pt. on medications negative cardio ROS Normal cardiovascular exam Rhythm:Regular Rate:Normal  Normal sinus rhythm Abnormal QRS-T angle, consider primary T wave abnormality Abnormal ECG No significant change since last tracing Confirmed by Mertie Moores ([id number]) on 05/26/2021 11:27:40 AM   Neuro/Psych PSYCHIATRIC DISORDERS (high anxiety) Anxiety TBI since 2015 negative neurological ROS     GI/Hepatic negative GI ROS, Neg liver ROS,   Endo/Other  negative endocrine ROShyponatremia  Renal/GU negative Renal ROS  negative genitourinary   Musculoskeletal negative musculoskeletal ROS (+)   Abdominal   Peds negative pediatric ROS (+)  Hematology negative hematology ROS (+) Blood dyscrasia, anemia ,   Anesthesia Other Findings   Reproductive/Obstetrics negative OB ROS                           Anesthesia Physical Anesthesia Plan  ASA: 2  Anesthesia Plan: General   Post-op Pain Management:    Induction: Intravenous  PONV Risk Score and Plan: 2 and Treatment may vary due to age or medical condition, Ondansetron and Dexamethasone  Airway Management Planned: Oral ETT  Additional Equipment: None  Intra-op Plan:   Post-operative Plan: Extubation in OR  Informed Consent: I have reviewed the patients History and Physical, chart, labs and discussed the procedure including the risks, benefits and alternatives for the proposed  anesthesia with the patient or authorized representative who has indicated his/her understanding and acceptance.     Dental advisory given  Plan Discussed with: CRNA  Anesthesia Plan Comments: (PAT note by Karoline Caldwell, PA-C: Preop labs notable for hyponatremia with sodium 127.  Remainder labs unremarkable.  Review of PCP records indicates the patient has chronic hyponatremia.  He was previously recommended sodium tablets, however could not tolerate this due to increase in blood pressure and these were discontinued when last seen by PCP on 05/13/2021.  Review of labs from PCP office over the past 2 years shows a sodium range 126-129.  Blood pressure noted to be elevated previously testing appointment, 178/75.  Review of PCP notes shows this to be generally under better control with systolics in the 0000000.  OSA on CPAP, wears nightly.   EKG 05/24/2021: Normal sinus rhythm.  Rate 61. Abnormal QRS-T angle, consider primary T wave abnormality. No significant change since last tracing )       Anesthesia Quick Evaluation

## 2021-05-28 ENCOUNTER — Inpatient Hospital Stay (HOSPITAL_COMMUNITY): Payer: Medicare Other | Admitting: Anesthesiology

## 2021-05-28 ENCOUNTER — Encounter (HOSPITAL_COMMUNITY): Payer: Self-pay | Admitting: Neurosurgery

## 2021-05-28 ENCOUNTER — Encounter (HOSPITAL_COMMUNITY)
Admission: RE | Disposition: A | Discharge status: Home / Self Care | Payer: Self-pay | Source: Home / Self Care | Attending: Neurosurgery

## 2021-05-28 ENCOUNTER — Inpatient Hospital Stay (HOSPITAL_COMMUNITY): Payer: Medicare Other | Admitting: Physician Assistant

## 2021-05-28 ENCOUNTER — Other Ambulatory Visit: Payer: Self-pay

## 2021-05-28 ENCOUNTER — Inpatient Hospital Stay (HOSPITAL_COMMUNITY): Payer: Medicare Other

## 2021-05-28 ENCOUNTER — Observation Stay (HOSPITAL_COMMUNITY)
Admission: RE | Admit: 2021-05-28 | Discharge: 2021-05-29 | Disposition: A | Discharge status: Home / Self Care | Payer: Medicare Other | Attending: Neurosurgery | Admitting: Neurosurgery

## 2021-05-28 DIAGNOSIS — M48061 Spinal stenosis, lumbar region without neurogenic claudication: Secondary | ICD-10-CM | POA: Insufficient documentation

## 2021-05-28 DIAGNOSIS — M7138 Other bursal cyst, other site: Secondary | ICD-10-CM | POA: Diagnosis not present

## 2021-05-28 DIAGNOSIS — Z8546 Personal history of malignant neoplasm of prostate: Secondary | ICD-10-CM | POA: Diagnosis not present

## 2021-05-28 DIAGNOSIS — Z79899 Other long term (current) drug therapy: Secondary | ICD-10-CM | POA: Insufficient documentation

## 2021-05-28 DIAGNOSIS — M5416 Radiculopathy, lumbar region: Secondary | ICD-10-CM | POA: Insufficient documentation

## 2021-05-28 DIAGNOSIS — M4316 Spondylolisthesis, lumbar region: Secondary | ICD-10-CM | POA: Diagnosis not present

## 2021-05-28 DIAGNOSIS — M4310 Spondylolisthesis, site unspecified: Secondary | ICD-10-CM | POA: Diagnosis present

## 2021-05-28 DIAGNOSIS — I10 Essential (primary) hypertension: Secondary | ICD-10-CM | POA: Diagnosis not present

## 2021-05-28 DIAGNOSIS — M545 Low back pain, unspecified: Secondary | ICD-10-CM | POA: Diagnosis present

## 2021-05-28 DIAGNOSIS — Z419 Encounter for procedure for purposes other than remedying health state, unspecified: Secondary | ICD-10-CM

## 2021-05-28 HISTORY — PX: LUMBAR LAMINECTOMY/DECOMPRESSION MICRODISCECTOMY: SHX5026

## 2021-05-28 LAB — ABO/RH: ABO/RH(D): O POS

## 2021-05-28 IMAGING — RF DG LUMBAR SPINE 2-3V
1 series · 2 of 2 positions shown · non-contrast
Comparison: CT of the lumbar spine [DATE].

CLINICAL DATA: Surgery, elective [V2] ([V2]-CM). Additional
history provided: Posterior lumbar interbody fusion-lumbar 4-5.
Provided fluoroscopy time 29 seconds (15.69 mGy).

EXAM:
LUMBAR SPINE - 2-3 VIEW; DG C-ARM 1-60 MIN

[Series 1: run · 2 of 2 slices shown]
[im 1/2]
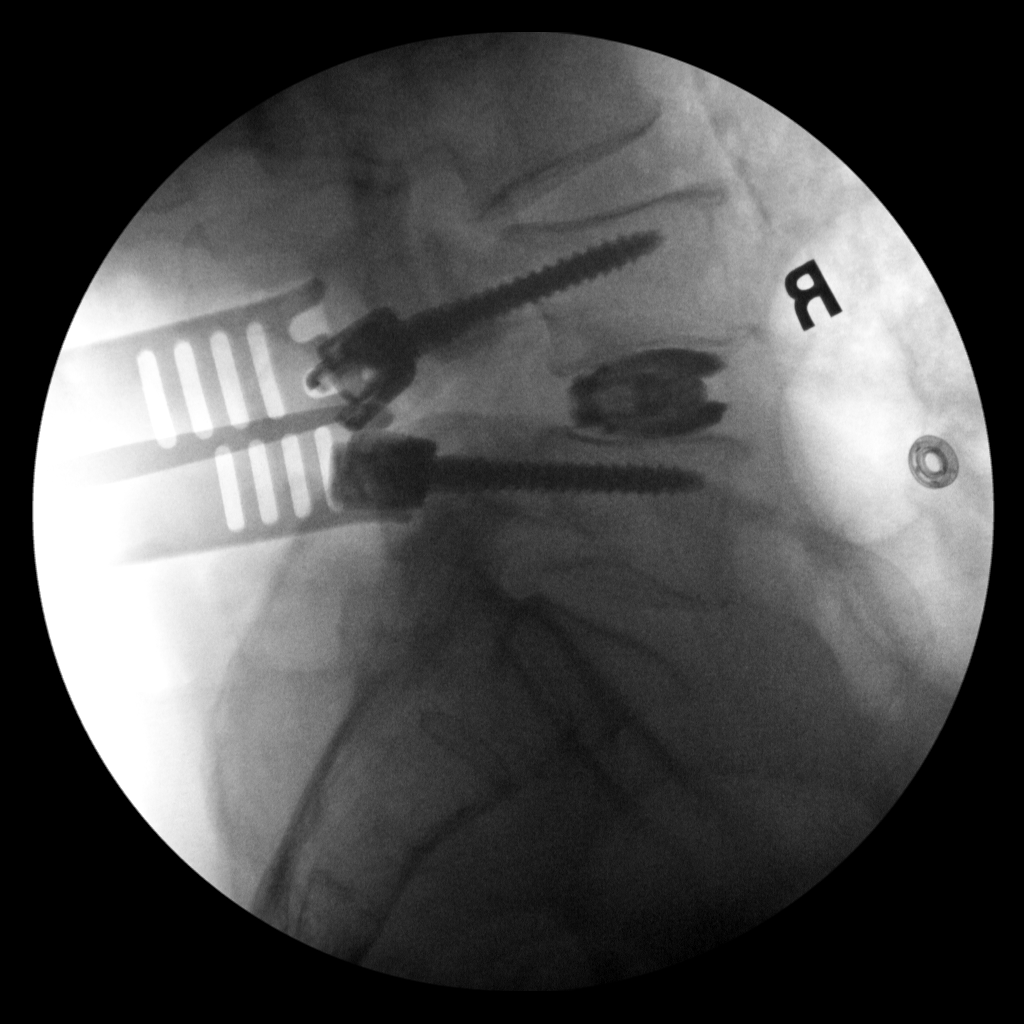
[im 2/2]
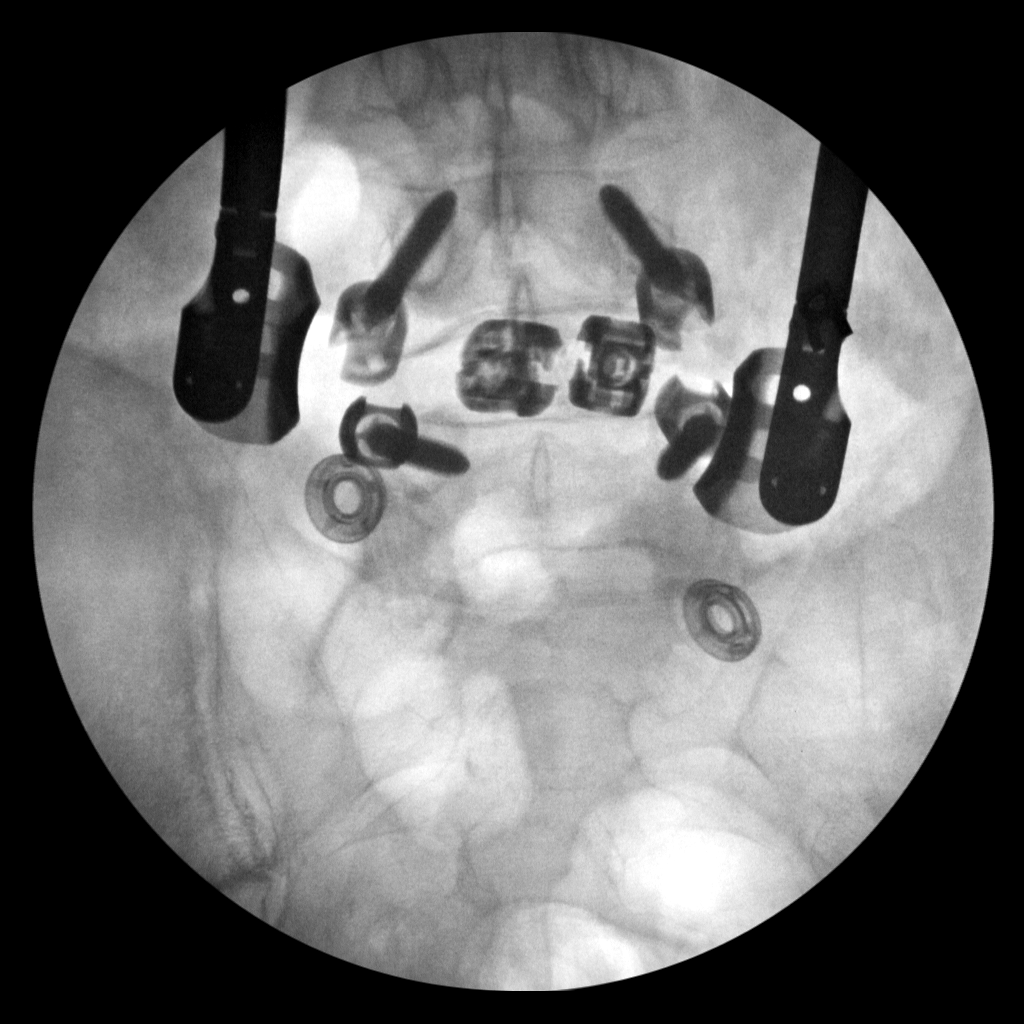

[2 of 2 positions shown; findings below may reference images not displayed]

FINDINGS: AP and lateral view intraoperative fluoroscopic images of the lumbar
spine are submitted, 2 images total. The lowest well-formed
intervertebral disc space is designated L5-S1. On the provided
images, pedicle screws are present bilaterally at L4 and L5.
Interbody device(s) also present at L4-L5. Overlying retractors.
IMPRESSION: Two intraoperative fluoroscopic images of the lumbar spine from
L4-L5 posterior fusion, as described.

## 2021-05-28 IMAGING — RF DG C-ARM 1-60 MIN
1 series · 2 of 2 positions shown · non-contrast
Comparison: CT of the lumbar spine [DATE].

CLINICAL DATA: Surgery, elective [V2] ([V2]-CM). Additional
history provided: Posterior lumbar interbody fusion-lumbar 4-5.
Provided fluoroscopy time 29 seconds (15.69 mGy).

EXAM:
LUMBAR SPINE - 2-3 VIEW; DG C-ARM 1-60 MIN

[Series 1: run · 2 of 2 slices shown]
[im 1/2]
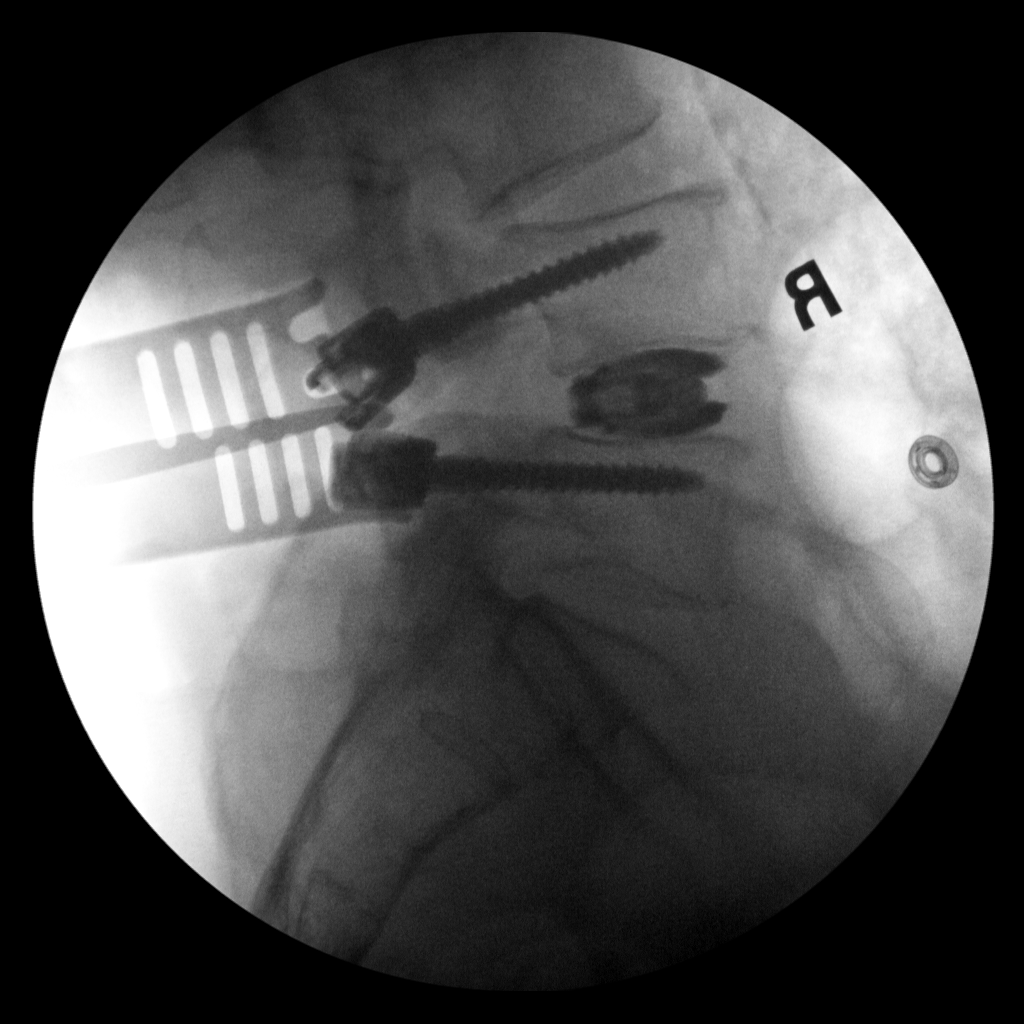
[im 2/2]
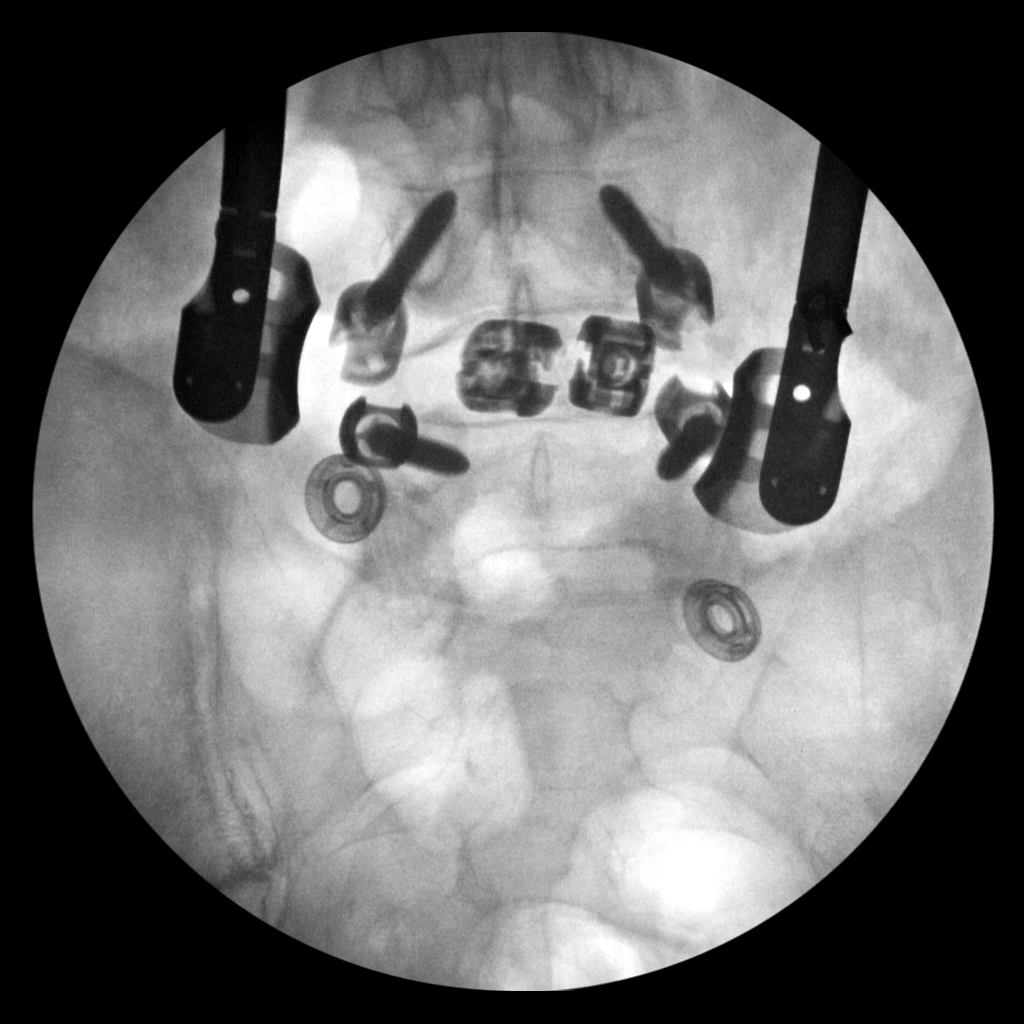

[2 of 2 positions shown; findings below may reference images not displayed]

FINDINGS: AP and lateral view intraoperative fluoroscopic images of the lumbar
spine are submitted, 2 images total. The lowest well-formed
intervertebral disc space is designated L5-S1. On the provided
images, pedicle screws are present bilaterally at L4 and L5.
Interbody device(s) also present at L4-L5. Overlying retractors.
IMPRESSION: Two intraoperative fluoroscopic images of the lumbar spine from
L4-L5 posterior fusion, as described.

## 2021-05-28 SURGERY — POSTERIOR LUMBAR FUSION 1 LEVEL
Anesthesia: General | Site: Back

## 2021-05-28 MED ORDER — ORAL CARE MOUTH RINSE: 15.0000 mL | Freq: Once | OROMUCOSAL | Status: AC

## 2021-05-28 MED ORDER — EPHEDRINE 5 MG/ML INJ
INTRAVENOUS | Status: AC
  Filled 2021-05-28: qty 5

## 2021-05-28 MED ORDER — DEXAMETHASONE SODIUM PHOSPHATE 10 MG/ML IJ SOLN
INTRAMUSCULAR | Status: AC
  Filled 2021-05-28: qty 1

## 2021-05-28 MED ORDER — ONDANSETRON HCL 4 MG/2ML IJ SOLN
INTRAMUSCULAR | Status: DC | PRN
  Administered 2021-05-28: 4 mg via INTRAVENOUS

## 2021-05-28 MED ORDER — ATORVASTATIN CALCIUM 40 MG PO TABS
40.0000 mg | ORAL_TABLET | Freq: Every day | ORAL | Status: DC
  Administered 2021-05-28: 40 mg via ORAL
  Filled 2021-05-28 (×2): qty 1

## 2021-05-28 MED ORDER — ONDANSETRON HCL 4 MG/2ML IJ SOLN
INTRAMUSCULAR | Status: AC
  Filled 2021-05-28: qty 2

## 2021-05-28 MED ORDER — PHENOL 1.4 % MT LIQD: 1.0000 | OROMUCOSAL | Status: DC | PRN

## 2021-05-28 MED ORDER — HYDRALAZINE HCL 20 MG/ML IJ SOLN
10.0000 mg | INTRAMUSCULAR | Status: AC
  Administered 2021-05-28: 10 mg via INTRAVENOUS
  Filled 2021-05-28: qty 1

## 2021-05-28 MED ORDER — B COMPLEX VITAMINS PO CAPS: 1.0000 | ORAL_CAPSULE | Freq: Every day | ORAL | Status: DC

## 2021-05-28 MED ORDER — VANCOMYCIN HCL 1000 MG IV SOLR
INTRAVENOUS | Status: DC | PRN
  Administered 2021-05-28: 1000 mg via TOPICAL

## 2021-05-28 MED ORDER — CEFAZOLIN SODIUM-DEXTROSE 2-4 GM/100ML-% IV SOLN
2.0000 g | INTRAVENOUS | Status: AC
  Administered 2021-05-28: 2 g via INTRAVENOUS
  Filled 2021-05-28: qty 100

## 2021-05-28 MED ORDER — BUPIVACAINE HCL (PF) 0.25 % IJ SOLN
INTRAMUSCULAR | Status: DC | PRN
  Administered 2021-05-28: 20 mL

## 2021-05-28 MED ORDER — CHLORHEXIDINE GLUCONATE CLOTH 2 % EX PADS: 6.0000 | MEDICATED_PAD | Freq: Once | CUTANEOUS | Status: DC

## 2021-05-28 MED ORDER — THROMBIN 20000 UNITS EX SOLR
CUTANEOUS | Status: AC
  Filled 2021-05-28: qty 20000

## 2021-05-28 MED ORDER — IRBESARTAN 150 MG PO TABS
300.0000 mg | ORAL_TABLET | Freq: Every day | ORAL | Status: DC
  Administered 2021-05-29: 300 mg via ORAL
  Filled 2021-05-28: qty 2

## 2021-05-28 MED ORDER — ONDANSETRON HCL 4 MG/2ML IJ SOLN: 4.0000 mg | Freq: Four times a day (QID) | INTRAMUSCULAR | Status: DC | PRN

## 2021-05-28 MED ORDER — PHENYLEPHRINE 40 MCG/ML (10ML) SYRINGE FOR IV PUSH (FOR BLOOD PRESSURE SUPPORT)
PREFILLED_SYRINGE | INTRAVENOUS | Status: AC
  Filled 2021-05-28: qty 10

## 2021-05-28 MED ORDER — ACETAMINOPHEN 500 MG PO TABS
ORAL_TABLET | ORAL | Status: AC
  Administered 2021-05-28: 1000 mg via ORAL
  Filled 2021-05-28: qty 2

## 2021-05-28 MED ORDER — SUGAMMADEX SODIUM 200 MG/2ML IV SOLN
INTRAVENOUS | Status: DC | PRN
  Administered 2021-05-28: 200 mg via INTRAVENOUS

## 2021-05-28 MED ORDER — DIAZEPAM 5 MG PO TABS: 5.0000 mg | ORAL_TABLET | Freq: Four times a day (QID) | ORAL | Status: DC | PRN

## 2021-05-28 MED ORDER — ACETAMINOPHEN 650 MG RE SUPP: 650.0000 mg | RECTAL | Status: DC | PRN

## 2021-05-28 MED ORDER — PROPOFOL 10 MG/ML IV BOLUS
INTRAVENOUS | Status: DC | PRN
  Administered 2021-05-28: 150 mg via INTRAVENOUS

## 2021-05-28 MED ORDER — FENTANYL CITRATE (PF) 250 MCG/5ML IJ SOLN
INTRAMUSCULAR | Status: DC | PRN
  Administered 2021-05-28 (×2): 50 ug via INTRAVENOUS
  Administered 2021-05-28: 25 ug via INTRAVENOUS
  Administered 2021-05-28: 50 ug via INTRAVENOUS

## 2021-05-28 MED ORDER — 0.9 % SODIUM CHLORIDE (POUR BTL) OPTIME
TOPICAL | Status: DC | PRN
  Administered 2021-05-28: 1000 mL

## 2021-05-28 MED ORDER — LIDOCAINE 2% (20 MG/ML) 5 ML SYRINGE
INTRAMUSCULAR | Status: AC
  Filled 2021-05-28: qty 5

## 2021-05-28 MED ORDER — ROCURONIUM BROMIDE 10 MG/ML (PF) SYRINGE
PREFILLED_SYRINGE | INTRAVENOUS | Status: DC | PRN
  Administered 2021-05-28: 20 mg via INTRAVENOUS
  Administered 2021-05-28: 80 mg via INTRAVENOUS

## 2021-05-28 MED ORDER — FENTANYL CITRATE (PF) 250 MCG/5ML IJ SOLN
INTRAMUSCULAR | Status: AC
  Filled 2021-05-28: qty 5

## 2021-05-28 MED ORDER — ROCURONIUM BROMIDE 10 MG/ML (PF) SYRINGE
PREFILLED_SYRINGE | INTRAVENOUS | Status: AC
  Filled 2021-05-28: qty 10

## 2021-05-28 MED ORDER — HYDROMORPHONE HCL 1 MG/ML IJ SOLN
INTRAMUSCULAR | Status: AC
  Filled 2021-05-28: qty 1

## 2021-05-28 MED ORDER — HYDROCODONE-ACETAMINOPHEN 10-325 MG PO TABS
1.0000 | ORAL_TABLET | ORAL | Status: DC | PRN
  Administered 2021-05-28 – 2021-05-29 (×2): 1 via ORAL
  Filled 2021-05-28 (×2): qty 1

## 2021-05-28 MED ORDER — DEXAMETHASONE SODIUM PHOSPHATE 10 MG/ML IJ SOLN
10.0000 mg | Freq: Once | INTRAMUSCULAR | Status: AC
  Administered 2021-05-28: 10 mg via INTRAVENOUS
  Filled 2021-05-28: qty 1

## 2021-05-28 MED ORDER — EPHEDRINE SULFATE-NACL 50-0.9 MG/10ML-% IV SOSY
PREFILLED_SYRINGE | INTRAVENOUS | Status: DC | PRN
  Administered 2021-05-28 (×2): 10 mg via INTRAVENOUS

## 2021-05-28 MED ORDER — ONDANSETRON HCL 4 MG PO TABS: 4.0000 mg | ORAL_TABLET | Freq: Four times a day (QID) | ORAL | Status: DC | PRN

## 2021-05-28 MED ORDER — OXYCODONE HCL 5 MG PO TABS
ORAL_TABLET | ORAL | Status: AC
  Filled 2021-05-28: qty 1

## 2021-05-28 MED ORDER — CEFAZOLIN SODIUM-DEXTROSE 1-4 GM/50ML-% IV SOLN
1.0000 g | Freq: Three times a day (TID) | INTRAVENOUS | Status: AC
Start: 2021-05-28 — End: 2021-05-28
  Administered 2021-05-28 (×2): 1 g via INTRAVENOUS
  Filled 2021-05-28 (×2): qty 50

## 2021-05-28 MED ORDER — ACETAMINOPHEN 500 MG PO TABS: 1000.0000 mg | ORAL_TABLET | Freq: Once | ORAL | Status: AC

## 2021-05-28 MED ORDER — THROMBIN 20000 UNITS EX SOLR: CUTANEOUS | Status: DC | PRN

## 2021-05-28 MED ORDER — PHENYLEPHRINE HCL-NACL 10-0.9 MG/250ML-% IV SOLN
INTRAVENOUS | Status: DC | PRN
  Administered 2021-05-28: 20 ug/min via INTRAVENOUS

## 2021-05-28 MED ORDER — HYDROMORPHONE HCL 1 MG/ML IJ SOLN: 1.0000 mg | INTRAMUSCULAR | Status: DC | PRN

## 2021-05-28 MED ORDER — ACETAMINOPHEN 325 MG PO TABS: 650.0000 mg | ORAL_TABLET | ORAL | Status: DC | PRN

## 2021-05-28 MED ORDER — OXYCODONE HCL 5 MG PO TABS: 10.0000 mg | ORAL_TABLET | ORAL | Status: DC | PRN

## 2021-05-28 MED ORDER — SODIUM CHLORIDE 0.9% FLUSH: 3.0000 mL | INTRAVENOUS | Status: DC | PRN

## 2021-05-28 MED ORDER — SODIUM CHLORIDE 0.9 % IV SOLN: 250.0000 mL | INTRAVENOUS | Status: DC

## 2021-05-28 MED ORDER — B COMPLEX-C PO TABS
1.0000 | ORAL_TABLET | Freq: Every day | ORAL | Status: DC
  Administered 2021-05-29: 1 via ORAL
  Filled 2021-05-28: qty 1

## 2021-05-28 MED ORDER — CHLORHEXIDINE GLUCONATE 0.12 % MT SOLN
15.0000 mL | Freq: Once | OROMUCOSAL | Status: AC
  Administered 2021-05-28: 15 mL via OROMUCOSAL
  Filled 2021-05-28: qty 15

## 2021-05-28 MED ORDER — AMISULPRIDE (ANTIEMETIC) 5 MG/2ML IV SOLN: 10.0000 mg | Freq: Once | INTRAVENOUS | Status: DC | PRN

## 2021-05-28 MED ORDER — LIDOCAINE 2% (20 MG/ML) 5 ML SYRINGE
INTRAMUSCULAR | Status: DC | PRN
  Administered 2021-05-28: 60 mg via INTRAVENOUS

## 2021-05-28 MED ORDER — PROPOFOL 10 MG/ML IV BOLUS
INTRAVENOUS | Status: AC
  Filled 2021-05-28: qty 20

## 2021-05-28 MED ORDER — FERROUS SULFATE 325 (65 FE) MG PO TABS
325.0000 mg | ORAL_TABLET | ORAL | Status: DC
  Administered 2021-05-29: 325 mg via ORAL
  Filled 2021-05-28: qty 1

## 2021-05-28 MED ORDER — OXYCODONE HCL 5 MG/5ML PO SOLN: 5.0000 mg | Freq: Once | ORAL | Status: AC | PRN

## 2021-05-28 MED ORDER — BUPIVACAINE HCL (PF) 0.25 % IJ SOLN
INTRAMUSCULAR | Status: AC
  Filled 2021-05-28: qty 30

## 2021-05-28 MED ORDER — BISACODYL 10 MG RE SUPP: 10.0000 mg | Freq: Every day | RECTAL | Status: DC | PRN

## 2021-05-28 MED ORDER — ONDANSETRON HCL 4 MG/2ML IJ SOLN: 4.0000 mg | Freq: Once | INTRAMUSCULAR | Status: DC | PRN

## 2021-05-28 MED ORDER — SODIUM CHLORIDE 0.9% FLUSH
3.0000 mL | Freq: Two times a day (BID) | INTRAVENOUS | Status: DC
  Administered 2021-05-28 (×2): 3 mL via INTRAVENOUS

## 2021-05-28 MED ORDER — LACTATED RINGERS IV SOLN: INTRAVENOUS | Status: DC

## 2021-05-28 MED ORDER — MENTHOL 3 MG MT LOZG: 1.0000 | LOZENGE | OROMUCOSAL | Status: DC | PRN

## 2021-05-28 MED ORDER — OXYCODONE HCL 5 MG PO TABS
5.0000 mg | ORAL_TABLET | Freq: Once | ORAL | Status: AC | PRN
  Administered 2021-05-28: 5 mg via ORAL

## 2021-05-28 MED ORDER — HYDROMORPHONE HCL 1 MG/ML IJ SOLN
0.2500 mg | INTRAMUSCULAR | Status: DC | PRN
  Administered 2021-05-28 (×2): 0.5 mg via INTRAVENOUS

## 2021-05-28 MED ORDER — POLYETHYLENE GLYCOL 3350 17 G PO PACK: 17.0000 g | PACK | Freq: Every day | ORAL | Status: DC | PRN

## 2021-05-28 MED ORDER — VANCOMYCIN HCL 1000 MG IV SOLR
INTRAVENOUS | Status: AC
  Filled 2021-05-28: qty 1000

## 2021-05-28 MED ORDER — TAMSULOSIN HCL 0.4 MG PO CAPS
0.4000 mg | ORAL_CAPSULE | Freq: Every day | ORAL | Status: DC
  Administered 2021-05-28: 0.4 mg via ORAL
  Filled 2021-05-28: qty 1

## 2021-05-28 MED ORDER — FLEET ENEMA 7-19 GM/118ML RE ENEM: 1.0000 | ENEMA | Freq: Once | RECTAL | Status: DC | PRN

## 2021-05-28 SURGICAL SUPPLY — 68 items
BAG COUNTER SPONGE SURGICOUNT (BAG) ×6 IMPLANT
BAG DECANTER FOR FLEXI CONT (MISCELLANEOUS) IMPLANT
BAND RUBBER #18 3X1/16 STRL (MISCELLANEOUS) IMPLANT
BENZOIN TINCTURE PRP APPL 2/3 (GAUZE/BANDAGES/DRESSINGS) ×3 IMPLANT
BLADE CLIPPER SURG (BLADE) ×3 IMPLANT
BONE GRAFTON DBF INJECT 3CC (Bone Implant) ×6 IMPLANT
BUR CUTTER 7.0 ROUND (BURR) ×3 IMPLANT
BUR MATCHSTICK NEURO 3.0 LAGG (BURR) ×3 IMPLANT
CANISTER SUCT 3000ML PPV (MISCELLANEOUS) ×3 IMPLANT
CAP LCK SPNE (Orthopedic Implant) ×8 IMPLANT
CAP LOCK SPINE RADIUS (Orthopedic Implant) ×8 IMPLANT
CAP LOCKING (Orthopedic Implant) ×4 IMPLANT
CARTRIDGE OIL MAESTRO DRILL (MISCELLANEOUS) ×2 IMPLANT
CNTNR URN SCR LID CUP LEK RST (MISCELLANEOUS) ×2 IMPLANT
CONT SPEC 4OZ STRL OR WHT (MISCELLANEOUS) ×1
COVER BACK TABLE 60X90IN (DRAPES) ×3 IMPLANT
DECANTER SPIKE VIAL GLASS SM (MISCELLANEOUS) IMPLANT
DERMABOND ADVANCED (GAUZE/BANDAGES/DRESSINGS) ×1
DERMABOND ADVANCED .7 DNX12 (GAUZE/BANDAGES/DRESSINGS) ×2 IMPLANT
DIFFUSER DRILL AIR PNEUMATIC (MISCELLANEOUS) ×3 IMPLANT
DRAPE C-ARM 42X72 X-RAY (DRAPES) ×6 IMPLANT
DRAPE HALF SHEET 40X57 (DRAPES) IMPLANT
DRAPE LAPAROTOMY 100X72X124 (DRAPES) ×3 IMPLANT
DRAPE MICROSCOPE LEICA (MISCELLANEOUS) IMPLANT
DRAPE SURG 17X23 STRL (DRAPES) ×12 IMPLANT
DRSG OPSITE POSTOP 4X6 (GAUZE/BANDAGES/DRESSINGS) ×3 IMPLANT
DURAPREP 26ML APPLICATOR (WOUND CARE) ×3 IMPLANT
ELECT REM PT RETURN 9FT ADLT (ELECTROSURGICAL) ×3
ELECTRODE REM PT RTRN 9FT ADLT (ELECTROSURGICAL) ×2 IMPLANT
EVACUATOR 1/8 PVC DRAIN (DRAIN) IMPLANT
GAUZE 4X4 16PLY ~~LOC~~+RFID DBL (SPONGE) ×3 IMPLANT
GAUZE SPONGE 4X4 12PLY STRL (GAUZE/BANDAGES/DRESSINGS) IMPLANT
GLOVE EXAM NITRILE XL STR (GLOVE) IMPLANT
GLOVE SURG ENC MOIS LTX SZ6.5 (GLOVE) ×15 IMPLANT
GLOVE SURG LTX SZ9 (GLOVE) ×6 IMPLANT
GLOVE SURG POLYISO LF SZ6 (GLOVE) ×3 IMPLANT
GLOVE SURG UNDER POLY LF SZ6.5 (GLOVE) ×9 IMPLANT
GLOVE SURG UNDER POLY LF SZ7.5 (GLOVE) ×3 IMPLANT
GOWN STRL REUS W/ TWL LRG LVL3 (GOWN DISPOSABLE) ×6 IMPLANT
GOWN STRL REUS W/ TWL XL LVL3 (GOWN DISPOSABLE) ×4 IMPLANT
GOWN STRL REUS W/TWL 2XL LVL3 (GOWN DISPOSABLE) IMPLANT
GOWN STRL REUS W/TWL LRG LVL3 (GOWN DISPOSABLE) ×3
GOWN STRL REUS W/TWL XL LVL3 (GOWN DISPOSABLE) ×2
KIT BASIN OR (CUSTOM PROCEDURE TRAY) ×3 IMPLANT
KIT TURNOVER KIT B (KITS) ×3 IMPLANT
MILL MEDIUM DISP (BLADE) ×3 IMPLANT
NEEDLE HYPO 22GX1.5 SAFETY (NEEDLE) ×3 IMPLANT
NEEDLE SPNL 22GX3.5 QUINCKE BK (NEEDLE) IMPLANT
NS IRRIG 1000ML POUR BTL (IV SOLUTION) ×3 IMPLANT
OIL CARTRIDGE MAESTRO DRILL (MISCELLANEOUS) ×3
PACK LAMINECTOMY NEURO (CUSTOM PROCEDURE TRAY) ×3 IMPLANT
PAD ARMBOARD 7.5X6 YLW CONV (MISCELLANEOUS) ×9 IMPLANT
ROD RADIUS 35MM (Rod) ×3 IMPLANT
ROD RADIUS 40MM (Neuro Prosthesis/Implant) ×1 IMPLANT
ROD SPNL 40X5.5XNS TI RDS (Neuro Prosthesis/Implant) ×2 IMPLANT
SCREW 5.75X45MM (Screw) ×12 IMPLANT
SPACER PL CATALYFT LONG 11 (Spacer) ×6 IMPLANT
SPONGE SURGIFOAM ABS GEL 100 (HEMOSTASIS) ×3 IMPLANT
SPONGE SURGIFOAM ABS GEL SZ50 (HEMOSTASIS) IMPLANT
STRIP CLOSURE SKIN 1/2X4 (GAUZE/BANDAGES/DRESSINGS) ×3 IMPLANT
SUT VIC AB 0 CT1 18XCR BRD8 (SUTURE) ×2 IMPLANT
SUT VIC AB 0 CT1 8-18 (SUTURE) ×1
SUT VIC AB 2-0 CT1 18 (SUTURE) ×3 IMPLANT
SUT VIC AB 3-0 SH 8-18 (SUTURE) ×6 IMPLANT
TOWEL GREEN STERILE (TOWEL DISPOSABLE) ×3 IMPLANT
TOWEL GREEN STERILE FF (TOWEL DISPOSABLE) ×3 IMPLANT
TRAY FOLEY MTR SLVR 16FR STAT (SET/KITS/TRAYS/PACK) ×3 IMPLANT
WATER STERILE IRR 1000ML POUR (IV SOLUTION) ×3 IMPLANT

## 2021-05-28 NOTE — Op Note (Signed)
Date of procedure: 05/28/2021  Date of dictation: Same  Service: Neurosurgery  Preoperative diagnosis: Grade 1 L4-5 degenerative spondylolisthesis with stenosis and synovial cyst with radiculopathy,   left L5-S1 spondylosis with adherent left synovial cyst  Postoperative diagnosis: Same  Procedure Name: Bilateral L4-5 decompressive laminotomies and facetectomies with resection of adherent left L4-5 synovial cyst, microdissection  Left L5-S1 laminotomy and resection of synovial cyst, microdissection  L4-5 posterior lumbar interbody fusion utilizing interbody cages, locally harvested autograft, and morselized allograft  L4-5 posterior lateral arthrodesis utilizing nonsegmental pedicle screw fixation and local autograft  Surgeon:Ronica Vivian A.Dayanne Yiu, M.D.  Asst. Surgeon: Einar Crow, NP  Anesthesia: General  Indication: 75 year old male with increasing back and bilateral lower extremity symptoms failing conservative management and right somewhat worse than left.  Work-up demonstrates evidence of an unstable grade 1 L4-5 degenerative spondylolisthesis with severe stenosis and a leftward L4-5 synovial cyst.  Patient also with a left-sided L5-S1 synovial cyst without evidence of instability.  Patient presents now for L4-5 decompression and fusion and left L5S1 decompression and resection of cyst.  Operative note: After induction anesthesia, patient position prone onto Wilson frame appropriate padded.  Lumbar region prepped and draped sterilely.  Incision made overlying L4-5.  Dissection performed bilaterally.  Retractor placed.  Fluoroscopy used.  Levels confirmed.  Decompressive laminotomies and facetectomies were performed bilaterally at L4-5 removing the inferior two thirds lamina of L4 the entire inferior facet and pars interarticularis of L4 and the majority of the superior facet of L5 and the superior aspect lamina of L5.  Ligament flavum elevated and resected.  On the left side  there was an adherent synovial cyst which required microdissection of the spinal canal to resected.  The cyst was completely resected.  There is no evidence of injury to thecal sac or nerve roots.  Bilateral discectomies at L4-5 were then performed.  Interspace was then prepared for interbody fusion.  The space cleaned of soft tissue.  Distractor placed patient's right side.  With a distractor on the right side an 11 mm [employer] expandable cage was then impacted into place and expanded.  Distractor removed patient's right side.  The space then prepared on the right side.  Morselized autograft was then packed into the interspace.  A second cage was then impacted into place and expanded.  Pedicles of L4 and L5 identified using surface landmarks and intraoperative fluoroscopy and superficial bone via the pedicle was then removed and high-speed drill.  Pedicle was then probed using a pedicle awl.  Parallel track was then probed and found to be solid within the bone.  Each pedicle tract was then tapped with a screw tap.  Screw drill hole was then probed and found to be solid within the bone.  5.75 mm radius brand screws from [employer] were placed bilaterally at L4 and L5.  Final images reveal good fusion cages and the hardware at the proper upper level with normal alignment of the spine.  Short segment titanium rod placed to the screws at L4 and L5.  Locked caps posterior the screws and locking caps and engaged with construct and compression.  Transverse processes decorticated.  Morselized autograft was packed posterior laterally for later fusion.  Attention then placed to the L5-S1 level on the left.  Dissection performed.  Retractor repositioned.  Decompressive laminotomies then performed using high-speed drill and Kerrison rongeurs.  Ligament flavum elevated and resected.  A large adherent synovial cyst was then dissected free using the microscope  for microdissection of the spinal canal.  The cyst was  completely resected.  All elements of compression upon the thecal sac the S1 and L5 nerve roots were relieved.  The wound was then irrigated with antibiotic solution.  Gelfoam was placed topically for hemostasis.  Vancomycin powder was placed in the deep wound space.  Wounds and closed in layers with Vicryl sutures.  Steri-Strips and sterile dressing were applied.  No apparent complications.  Patient tolerated the procedure well and he returned to recovery room postop. 05/28/2021

## 2021-05-28 NOTE — Progress Notes (Signed)
Foley removed at this time patient tolerated procedure well

## 2021-05-28 NOTE — Progress Notes (Signed)
Also made MD aware of bp being low

## 2021-05-28 NOTE — Progress Notes (Signed)
Made MD aware patient staring into space once patient got to wheel chair made MD patient was awake and talking at this time requested 500 ml bolus of fluids be given

## 2021-05-28 NOTE — Transfer of Care (Signed)
Immediate Anesthesia Transfer of Care Note  Patient: Cody Gonzalez  Procedure(s) Performed: Posterior Lumbar Interbody Fusion - Lumbar four-five (Back) Left  - Lumbar five-Sacral one Laminectomy resection of synovial cyst (Left: Back)  Patient Location: PACU  Anesthesia Type:General  Level of Consciousness: awake, drowsy and patient cooperative  Airway & Oxygen Therapy: Patient Spontanous Breathing  Post-op Assessment: Report given to RN and Post -op Vital signs reviewed and stable  Post vital signs: Reviewed and stable  Last Vitals:  Vitals Value Taken Time  BP 108/47 05/28/21 1043  Temp    Pulse 66 05/28/21 1044  Resp 19 05/28/21 1044  SpO2 97 % 05/28/21 1044  Vitals shown include unvalidated device data.  Last Pain:  Vitals:   05/28/21 0703  TempSrc:   PainSc: 0-No pain      Patients Stated Pain Goal: 4 ([id number])  Complications: No notable events documented.

## 2021-05-28 NOTE — Progress Notes (Signed)
SBP Elevated in Pre-Op: Notified Dr. Elgie Congo of elevated BP in Pre-op. Pt denies dizziness, lightheadedness, SHOB. No acute distress noted. Verbal order received for '10mg'$  IV Hydralazine.   05/28/21 0615 05/28/21 0703 05/28/21 0726  Vitals  Temp 98.2 F (36.8 C)  --   --   Temp Source Oral  --   --   Pulse Rate 75  --   --   Pulse Rate Source Monitor;Right  --   --   Resp 17  --   --   BP (!) 238/79  --  (!) 224/81 (10 mg IV Hydralazine given)  SpO2 98 %  --   --   Pain Assessment  Pain Scale  --  0-10  --   Pain Score  --  0  --     05/28/21 0729  Vitals  Temp  --   Temp Source  --   Pulse Rate  --   Pulse Rate Source  --   Resp  --   BP (!) 218/69  SpO2  --   Pain Assessment  Pain Scale  --   Pain Score  --

## 2021-05-28 NOTE — Anesthesia Procedure Notes (Signed)
Procedure Name: Intubation Date/Time: 05/28/2021 8:13 AM Performed by: Dorthea Cove, CRNA Pre-anesthesia Checklist: Patient identified, Emergency Drugs available, Suction available and Patient being monitored Patient Re-evaluated:Patient Re-evaluated prior to induction Oxygen Delivery Method: Circle system utilized Preoxygenation: Pre-oxygenation with 100% oxygen Induction Type: IV induction Ventilation: Mask ventilation without difficulty Laryngoscope Size: Mac and 4 Grade View: Grade I Tube type: Oral Tube size: 7.5 mm Number of attempts: 1 Airway Equipment and Method: Stylet and Oral airway Placement Confirmation: ETT inserted through vocal cords under direct vision, positive ETCO2 and breath sounds checked- equal and bilateral Secured at: 23 cm Tube secured with: Tape Dental Injury: Teeth and Oropharynx as per pre-operative assessment

## 2021-05-28 NOTE — Brief Op Note (Signed)
05/28/2021  10:31 AM  PATIENT:  Cody Gonzalez  75 y.o. male  PRE-OPERATIVE DIAGNOSIS:  Spondylolisthesis - synovial cyst  POST-OPERATIVE DIAGNOSIS:  Spondylolisthesis - synovial cyst  PROCEDURE:  Procedure(s): Posterior Lumbar Interbody Fusion - Lumbar four-five (N/A) Left  - Lumbar five-Sacral one Laminectomy resection of synovial cyst (Left)  SURGEON:  Surgeon(s) and Role:    * Earnie Larsson, MD - Primary    * Consuella Lose, MD - Assisting  PHYSICIAN ASSISTANT:   ASSISTANTSMearl Gonzalez   ANESTHESIA:   general  EBL:  100 mL   BLOOD ADMINISTERED:none  DRAINS: none   LOCAL MEDICATIONS USED:  MARCAINE     SPECIMEN:  No Specimen  DISPOSITION OF SPECIMEN:  N/A  COUNTS:  YES  TOURNIQUET:  * No tourniquets in log *  DICTATION: .Dragon Dictation  PLAN OF CARE: Admit to inpatient   PATIENT DISPOSITION:  PACU - hemodynamically stable.   Delay start of Pharmacological VTE agent (>24hrs) due to surgical blood loss or risk of bleeding: yes

## 2021-05-28 NOTE — H&P (Signed)
Cody Gonzalez is an 75 y.o. male.   Chief Complaint: Back pain HPI: 75 year old male with lower back pain with radiation to the posterior aspects of both lower extremities right greater than left.  Symptoms are aggravated by prolonged standing or walking.  Patient is failed conservative management including injections.  Work-up demonstrates evidence of degenerative spondylolisthesis with severe lateral recess and foraminal stenosis at L4-5.  Patient also with a left-sided L5-S1 synovial cyst.  There is no evidence of instability at L5-S1.  Patient presents now for L4-5 decompression and fusion and left L5-S1 decompression and resection of synovial cyst.  Past Medical History:  Diagnosis Date   Anemia    Hypertension    Prostate cancer ([place])    Sleep apnea    uses cpap    TBI (traumatic brain injury) ([place]) 2015    Past Surgical History:  Procedure Laterality Date   BILATERAL CARPAL TUNNEL RELEASE  2014   CYSTOSCOPY N/A 06/29/2019   Procedure: CYSTOSCOPY;  Surgeon: Alexis Frock, MD;  Location[facility];  Service: Urology;  Laterality: N/A;  No seeds in bladder per Dr. Tresa Moore   PROSTATE BIOPSY     RADIOACTIVE SEED IMPLANT N/A 06/29/2019   Procedure: RADIOACTIVE SEED IMPLANT/BRACHYTHERAPY IMPLANT;  Surgeon: Alexis Frock, MD;  Location: [facility];  Service: Urology;  Laterality: N/A;  90 MINS   SPACE OAR INSTILLATION N/A 06/29/2019   Procedure: SPACE OAR INSTILLATION;  Surgeon: Alexis Frock, MD;  Location: [facility];  Service: Urology;  Laterality: N/A;    Family History  Problem Relation Age of Onset   Brain cancer Father        GBM   Social History:  reports that he has never smoked. He has never used smokeless tobacco. He reports current alcohol use of about 2.0 standard drinks of alcohol per week. He reports that he does not use drugs.  Allergies: No Known Allergies  Medications Prior to Admission  Medication Sig  Dispense Refill   atorvastatin (LIPITOR) 40 MG tablet Take 40 mg by mouth daily.     b complex vitamins capsule Take 1 capsule by mouth daily.     CALCIUM PO Take 1 tablet by mouth daily.     ferrous sulfate 325 (65 FE) MG tablet Take 325 mg by mouth every other day.     tamsulosin (FLOMAX) 0.4 MG CAPS capsule TAKE 1 CAPSULE DAILY AFTER SUPPER. FOR UINARY URGENCY/FREQUENCY AFTERPROSTRATE RADIATION. (Patient taking differently: Take 0.4 mg by mouth daily after supper.) 30 capsule 0   telmisartan (MICARDIS) 80 MG tablet Take 80 mg by mouth daily.      Results for orders placed or performed during the hospital encounter of 05/28/21 (from the past 48 hour(s))  ABO/Rh     Status: None (Preliminary result)   Collection Time: 05/28/21  7:13 AM  Result Value Ref Range   ABO/RH(D) PENDING    No results found.  Pertinent items noted in HPI and remainder of comprehensive ROS otherwise negative.  Blood pressure (!) 218/69, pulse 75, temperature 98.2 F (36.8 C), temperature source Oral, resp. rate 17, height '5\' 7"'$  (1.702 m), weight 72.1 kg, SpO2 98 %.  Patient is awake and alert.  He is oriented and appropriate.  Speech is fluent.  Judgment insight are intact.  Cranial nerve function normal bilateral motor examination reveals intact motor strength in both upper extremities.  Is some mild weakness of dorsiflexion bilaterally otherwise motor strength intact in his lower extremities.  Sensory examination with some decrease sensation pinprick and light touch in his right L5 dermatome.  Straight raising equivocal bilaterally.  Deep tender versus normal active except his Achilles reflexes are absent.  Gait is antalgic.  Posture is moderately flexed peer examination head ears eyes nose throat is unremarked.  Chest and abdomen are benign.  Extremities are free of major deformity. Assessment/Plan L4-5 degenerative grade 1 spondylolisthesis with stenosis and radiculopathy.  Left L5-S1 synovial cyst with  radiculopathy.  Plan bilateral L4-5 decompressive laminotomies and foraminotomies followed by posterior lumbar to body fusion utilizing interbody cages, local harvested autograft, and augmented with posterior arthrodesis utilizing nonsegmental pedicle screw fixation local autografting.  Also plan left-sided L5-S1 laminotomy and resection of synovial cyst.  Risks and benefits been explained.  Patient wishes to proceed.  Cooper Render Sukhdeep Wieting 05/28/2021, 7:54 AM

## 2021-05-28 NOTE — Anesthesia Postprocedure Evaluation (Signed)
Anesthesia Post Note  Patient: Cody Gonzalez  Procedure(s) Performed: Posterior Lumbar Interbody Fusion - Lumbar four-five (Back) Left  - Lumbar five-Sacral one Laminectomy resection of synovial cyst (Left: Back)     Patient location during evaluation: PACU Anesthesia Type: General Level of consciousness: awake Pain management: pain level controlled Vital Signs Assessment: post-procedure vital signs reviewed and stable Respiratory status: spontaneous breathing and respiratory function stable Cardiovascular status: stable Postop Assessment: no apparent nausea or vomiting Anesthetic complications: no   No notable events documented.  Last Vitals:  Vitals:   05/28/21 1230 05/28/21 1310  BP: (!) 95/48 (!) 137/59  Pulse:  61  Resp:  18  Temp:    SpO2:  95%    Last Pain:  Vitals:   05/28/21 1526  TempSrc:   PainSc: 4                  Candra R Ereka Brau

## 2021-05-28 NOTE — Progress Notes (Signed)
Orthopedic Tech Progress Note Patient Details:  Cody Gonzalez 08/04/1946 [id number]  RN stated patient has brace   Patient ID: Cody Gonzalez, male   DOB: [birth year], 75 y.o.   MRN: [id number]  Cody Gonzalez 05/28/2021, 3:13 PM

## 2021-05-29 DIAGNOSIS — M4316 Spondylolisthesis, lumbar region: Secondary | ICD-10-CM | POA: Diagnosis not present

## 2021-05-29 MED ORDER — METHOCARBAMOL 500 MG PO TABS: 500.0000 mg | ORAL_TABLET | Freq: Four times a day (QID) | ORAL | 1 refills | Status: DC

## 2021-05-29 MED ORDER — HYDROCODONE-ACETAMINOPHEN 10-325 MG PO TABS: 1.0000 | ORAL_TABLET | ORAL | 0 refills | Status: DC | PRN

## 2021-05-29 NOTE — Evaluation (Signed)
Physical Therapy Evaluation Patient Details Name: Cody Gonzalez MRN: [id number] DOB: 03-31-1946 Today's Date: 05/29/2021   History of Present Illness  Pt is a 75 y/o male who presents s/p L4-S1 PLIF and resection of synovial cyst on 05/28/2021.  PMH significant for HTN, TBI 2015, sleep apnea, Prostate CA.   Clinical Impression  Pt admitted with above diagnosis. At the time of PT eval, pt was able to demonstrate transfers and ambulation with gross min guard assist and RW for support. Pt was educated on precautions, brace application/wearing schedule, appropriate activity progression, and car transfer. Pt currently with functional limitations due to the deficits listed below (see PT Problem List). Pt will benefit from skilled PT to increase their independence and safety with mobility to allow discharge to the venue listed below.      Follow Up Recommendations No PT follow up;Supervision for mobility/OOB    Equipment Recommendations  Rolling walker with 5" wheels    Recommendations for Other Services       Precautions / Restrictions Precautions Precautions: Back;Fall Precaution Booklet Issued: Yes (comment) Precaution Comments: Reviewed handout and pt was cued for precautions during functional mobility. Required Braces or Orthoses: Spinal Brace Spinal Brace: Lumbar corset;Applied in sitting position Restrictions Weight Bearing Restrictions: No      Mobility  Bed Mobility Overal bed mobility: Needs Assistance Bed Mobility: Rolling;Sidelying to Sit Rolling: Min guard Sidelying to sit: Supervision       General bed mobility comments: Pt received layingi n bed supine with brace donned. Education provided to avoid laying down with brace donned. Pt required multimodal cues for proper log roll technique for transition to EOB.    Transfers Overall transfer level: Needs assistance Equipment used: Rolling walker (2 wheeled) Transfers: Sit to/from Stand Sit to Stand: Min  guard         General transfer comment: VC's for hand placement on seated surface for safety. No assist required however close guard provided throughout.  Ambulation/Gait Ambulation/Gait assistance: Min guard Gait Distance (Feet): 400 Feet Assistive device: Rolling walker (2 wheeled) Gait Pattern/deviations: Step-through pattern;Decreased stride length;Trunk flexed Gait velocity: Decreased Gait velocity interpretation: <1.8 ft/sec, indicate of risk for recurrent falls General Gait Details: VC's for improved posture, closer walker proximity, and forward gaze. Pt leaning L which wife reports is baseline. Required physical assist to return to midline however pt states it feels that he is being pushed far to the R. Overall posture improved by end of session and repetition of cues for posture.  Stairs Stairs: Yes Stairs assistance: Min guard Stair Management: One rail Left;Step to pattern;Forwards Number of Stairs: 5 General stair comments: VC's for sequencing and general safety. No assist required however hands on guarding provided throughout session for safety.  Wheelchair Mobility    Modified Rankin (Stroke Patients Only)       Balance Overall balance assessment: Mild deficits observed, not formally tested                                           Pertinent Vitals/Pain Pain Assessment: No/denies pain    Home Living Family/patient expects to be discharged to:: Private residence Living Arrangements: Spouse/significant other Available Help at Discharge: Family Type of Home: House Home Access: Stairs to enter Entrance Stairs-Rails: Left Entrance Stairs-Number of Steps: 4 Home Layout: One level Home Equipment: Grab bars - tub/shower;Hand held shower head;Shower seat Additional Comments: no  animals. wife can do all driving    Prior Function Level of Independence: Independent               Hand Dominance   Dominant Hand: Right     Extremity/Trunk Assessment   Upper Extremity Assessment Upper Extremity Assessment: Defer to OT evaluation    Lower Extremity Assessment Lower Extremity Assessment: Generalized weakness RLE Deficits / Details: reports RLE feels weaker    Cervical / Trunk Assessment Cervical / Trunk Assessment: Other exceptions Cervical / Trunk Exceptions: s/p surgery  Communication   Communication: HOH (hearing aides in place)  Cognition Arousal/Alertness: Awake/alert Behavior During Therapy: WFL for tasks assessed/performed Overall Cognitive Status: Within Functional Limits for tasks assessed                                        General Comments      Exercises     Assessment/Plan    PT Assessment Patient needs continued PT services  PT Problem List Decreased strength;Decreased activity tolerance;Decreased balance;Decreased mobility;Decreased knowledge of use of DME;Decreased safety awareness;Decreased knowledge of precautions;Pain       PT Treatment Interventions DME instruction;Gait training;Stair training;Functional mobility training;Therapeutic activities;Therapeutic exercise;Neuromuscular re-education;Patient/family education    PT Goals (Current goals can be found in the Care Plan section)  Acute Rehab PT Goals Patient Stated Goal: to go home PT Goal Formulation: With patient/family Time For Goal Achievement: 06/05/21 Potential to Achieve Goals: Good    Frequency Min 5X/week   Barriers to discharge        Co-evaluation               AM-PAC PT "6 Clicks" Mobility  Outcome Measure Help needed turning from your back to your side while in a flat bed without using bedrails?: A Little Help needed moving from lying on your back to sitting on the side of a flat bed without using bedrails?: A Little Help needed moving to and from a bed to a chair (including a wheelchair)?: A Little Help needed standing up from a chair using your arms (e.g., wheelchair or  bedside chair)?: A Little Help needed to walk in hospital room?: A Little Help needed climbing 3-5 steps with a railing? : A Little 6 Click Score: 18    End of Session Equipment Utilized During Treatment: Gait belt Activity Tolerance: Patient tolerated treatment well Patient left: in bed;with call bell/phone within reach;with family/visitor present Nurse Communication: Mobility status PT Visit Diagnosis: Unsteadiness on feet (R26.81);Pain Pain - part of body:  (back)    Time: [id number] PT Time Calculation (min) (ACUTE ONLY): 21 min   Charges:   PT Evaluation $PT Eval Low Complexity: 1 Low          .lk   Thelma Comp 05/29/2021, 2:18 PM

## 2021-05-29 NOTE — Care Management CC44 (Signed)
Condition Code 44 Documentation Completed  Patient Details  Name: Cody Gonzalez MRN: [id number] Date of Birth: 1946/07/04   Condition Code 44 given:  Yes Patient signature on Condition Code 44 notice:  Yes Documentation of 2 MD's agreement:  Yes Code 44 added to claim:  Yes    Angelita Ingles, RN 05/29/2021, 10:19 AM

## 2021-05-29 NOTE — Discharge Summary (Signed)
Physician Discharge Summary  Patient ID: Cody Gonzalez MRN: [id number] DOB/AGE: 07-17-1946 75 y.o.  Admit date: 05/28/2021 Discharge date: 05/29/2021  Admission Diagnoses:  Discharge Diagnoses:  Active Problems:   Degenerative spondylolisthesis   Discharged Condition: good  Hospital Course: Patient admitted to the hospital where he underwent uncomplicated lumbar decompression and fusion surgery.  Postoperatively doing very well.  Standing ambulating and voiding without difficulty.  Pain well controlled.  Ready for discharge home.  Consults:   Significant Diagnostic Studies:   Treatments:   Discharge Exam: Blood pressure (!) 141/58, pulse 83, temperature 98.2 F (36.8 C), temperature source Oral, resp. rate 18, height '5\' 7"'$  (1.702 m), weight 72.1 kg, SpO2 100 %. Awake and alert.  Oriented and appropriate.  Motor and sensory function intact.  Wound clean and dry.  Chest and abdomen benign.  Disposition: Discharge disposition: 01-Home or Self Care        Allergies as of 05/29/2021   No Known Allergies      Medication List     TAKE these medications    atorvastatin 40 MG tablet Commonly known as: LIPITOR Take 40 mg by mouth daily.   b complex vitamins capsule Take 1 capsule by mouth daily.   CALCIUM PO Take 1 tablet by mouth daily.   ferrous sulfate 325 (65 FE) MG tablet Take 325 mg by mouth every other day.   HYDROcodone-acetaminophen 10-325 MG tablet Commonly known as: NORCO Take 1 tablet by mouth every 4 (four) hours as needed for moderate pain ((score 4 to 6)).   methocarbamol 500 MG tablet Commonly known as: Robaxin Take 1 tablet (500 mg total) by mouth 4 (four) times daily.   telmisartan 80 MG tablet Commonly known as: MICARDIS Take 80 mg by mouth daily.       ASK your doctor about these medications    tamsulosin 0.4 MG Caps capsule Commonly known as: FLOMAX TAKE 1 CAPSULE DAILY AFTER SUPPER. FOR UINARY URGENCY/FREQUENCY  AFTERPROSTRATE RADIATION.               Durable Medical Equipment  (From admission, onward)           Start     Ordered   05/28/21 1313  DME Walker rolling  Once       Question:  Patient needs a walker to treat with the following condition  Answer:  Degenerative spondylolisthesis   05/28/21 1312   05/28/21 1313  DME 3 n 1  Once        05/28/21 1312             Signed: Cooper Render Lamarcus Spira 05/29/2021, 9:52 AM

## 2021-05-29 NOTE — Evaluation (Signed)
Occupational Therapy Evaluation Patient Details Name: Cody Gonzalez MRN: [id number] DOB: Nov 03, 1946 Today's Date: 05/29/2021    History of Present Illness 75 yo male s/p 7/26 PLIF L4-5 L5-S1 laminectomy resection of synovial cyst pmh HTN TBI 2015 sleep apnea Prostate CA   Clinical Impression   Patient evaluated by Occupational Therapy with no further acute OT needs identified. All education has been completed and the patient has no further questions. See below for any follow-up Occupational Therapy or equipment needs. OT to sign off. Thank you for referral.      Follow Up Recommendations  No OT follow up    Equipment Recommendations  Other (comment) (RW)    Recommendations for Other Services       Precautions / Restrictions Precautions Precautions: Back Precaution Comments: handout provided and reviewed for back precautions Required Braces or Orthoses: Spinal Brace Spinal Brace: Lumbar corset;Applied in sitting position Restrictions Weight Bearing Restrictions: No      Mobility Bed Mobility Overal bed mobility: Modified Independent                  Transfers Overall transfer level: Needs assistance Equipment used: Rolling walker (2 wheeled) Transfers: Sit to/from Stand Sit to Stand: Min guard         General transfer comment: cues for hand placement multiple times and repeating transfer x3 for reinforcement    Balance Overall balance assessment: Mild deficits observed, not formally tested                                         ADL either performed or assessed with clinical judgement   ADL Overall ADL's : Needs assistance/impaired Eating/Feeding: Modified independent   Grooming: Wash/dry face   Upper Body Bathing: Modified independent   Lower Body Bathing: Minimal assistance   Upper Body Dressing : Modified independent Upper Body Dressing Details (indicate cue type and reason): able to don brace Lower Body Dressing:  Minimal assistance;Sit to/from stand Lower Body Dressing Details (indicate cue type and reason): cues for bending multiple times during session. recommend reacher Toilet Transfer: Designer, television/film set Details (indicate cue type and reason): cues for hand placement         Functional mobility during ADLs: Min guard;Rolling walker General ADL Comments: pt reliant on RW at this time  Back handout provided and reviewed adls in detail. Pt educated on: clothing between brace, never sleep in brace,  avoid sitting for long periods of time, correct bed positioning for sleeping, correct sequence for bed mobility, avoiding lifting more than 5 pounds and never wash directly over incision. All education is complete and patient indicates understanding.    Vision Baseline Vision/History: Wears glasses Wears Glasses: At all times       Perception     Praxis      Pertinent Vitals/Pain Pain Assessment: No/denies pain     Hand Dominance Right   Extremity/Trunk Assessment Upper Extremity Assessment Upper Extremity Assessment: Overall WFL for tasks assessed   Lower Extremity Assessment Lower Extremity Assessment: Defer to PT evaluation;RLE deficits/detail RLE Deficits / Details: reports RLE is weaker   Cervical / Trunk Assessment Cervical / Trunk Assessment: Other exceptions (s/p surg)   Communication Communication Communication: HOH (hearing aides in place)   Cognition Arousal/Alertness: Awake/alert Behavior During Therapy: WFL for tasks assessed/performed Overall Cognitive Status: Within Functional Limits for tasks assessed  General Comments  incision dry and intact    Exercises     Shoulder Instructions      Home Living Family/patient expects to be discharged to:: Private residence Living Arrangements: Spouse/significant other Available Help at Discharge: Family Type of Home: House Home Access: Stairs to  enter Technical brewer of Steps: 4 Entrance Stairs-Rails: Left Home Layout: One level     Bathroom Shower/Tub: Occupational psychologist: [place]: Grab bars - tub/shower;Hand held shower head;Shower seat   Additional Comments: no animals. wife can do all driving      Prior Functioning/Environment Level of Independence: Independent                 OT Problem List: Decreased safety awareness;Decreased knowledge of use of DME or AE;Decreased knowledge of precautions;Impaired balance (sitting and/or standing)      OT Treatment/Interventions:      OT Goals(Current goals can be found in the care plan section) Acute Rehab OT Goals Patient Stated Goal: to go home OT Goal Formulation: With patient/family  OT Frequency:     Barriers to D/C:            Co-evaluation              AM-PAC OT "6 Clicks" Daily Activity     Outcome Measure Help from another person eating meals?: None Help from another person taking care of personal grooming?: None Help from another person toileting, which includes using toliet, bedpan, or urinal?: A Little Help from another person bathing (including washing, rinsing, drying)?: A Little Help from another person to put on and taking off regular upper body clothing?: None Help from another person to put on and taking off regular lower body clothing?: A Little 6 Click Score: 21   End of Session Equipment Utilized During Treatment: Rolling walker;Back brace Nurse Communication: Mobility status;Precautions  Activity Tolerance: Patient tolerated treatment well Patient left: with call bell/phone within reach;in bed;with family/visitor present  OT Visit Diagnosis: Unsteadiness on feet (R26.81)                [id number] OT Time Calculation (min): 22 min Charges:  OT General Charges $OT Visit: 1 Visit OT Evaluation $OT Eval Moderate Complexity: 1 Mod   Cody Gonzalez, Cody Gonzalez  Acute Rehabilitation  Services Pager: [phone] Office: [phone] .   Jeri Modena 05/29/2021, 10:30 AM

## 2021-05-29 NOTE — Progress Notes (Signed)
Patient was transported via wheelchair by volunteer for discharge home; in no acute distress nor complaints of pain nor discomfort; room was checked and accounted for all belongings; discharge instructions given to patient and his wife by RN and both verbalized understanding on the instructions given.

## 2021-05-29 NOTE — Discharge Instructions (Signed)

## 2021-05-29 NOTE — Care Management Obs Status (Signed)
[facility] NOTIFICATION   Patient Details  Name: Cody Gonzalez MRN: [id number] Date of Birth: July 07, 1946   Medicare Observation Status Notification Given:  Yes    Angelita Ingles, RN 05/29/2021, 10:19 AM

## 2021-05-29 NOTE — Plan of Care (Signed)
  Problem: Safety: Goal: Ability to remain free from injury will improve Outcome: Progressing   Problem: Education: Goal: Ability to verbalize activity precautions or restrictions will improve Outcome: Progressing Goal: Understanding of discharge needs will improve Outcome: Progressing   Problem: Activity: Goal: Ability to avoid complications of mobility impairment will improve Outcome: Progressing Goal: Ability to tolerate increased activity will improve Outcome: Progressing Goal: Will remain free from falls Outcome: Progressing

## 2021-05-30 ENCOUNTER — Encounter (HOSPITAL_COMMUNITY): Payer: Self-pay | Admitting: Neurosurgery

## 2021-05-30 MED FILL — Heparin Sodium (Porcine) Inj 1000 Unit/ML: INTRAMUSCULAR | Qty: 30 | Status: AC

## 2021-05-30 MED FILL — Sodium Chloride IV Soln 0.9%: INTRAVENOUS | Qty: 1000 | Status: AC

## 2021-07-26 ENCOUNTER — Telehealth: Payer: Self-pay | Admitting: *Deleted

## 2021-07-26 ENCOUNTER — Ambulatory Visit: Payer: Medicare Other | Admitting: Podiatry

## 2021-07-26 ENCOUNTER — Other Ambulatory Visit (HOSPITAL_BASED_OUTPATIENT_CLINIC_OR_DEPARTMENT_OTHER): Payer: Self-pay

## 2021-07-26 ENCOUNTER — Ambulatory Visit: Payer: Medicare Other

## 2021-07-26 ENCOUNTER — Ambulatory Visit: Payer: Medicare Other | Attending: Internal Medicine

## 2021-07-26 DIAGNOSIS — Z23 Encounter for immunization: Secondary | ICD-10-CM

## 2021-07-26 MED ORDER — PFIZER COVID-19 VAC BIVALENT 30 MCG/0.3ML IM SUSP
INTRAMUSCULAR | 0 refills | Status: DC
  Filled 2021-07-26: qty 0.3, 1d supply, fill #0

## 2021-07-26 NOTE — Telephone Encounter (Signed)
Patient is calling because he missed his appointment today because got lost. Please contact to reschedule.

## 2021-07-26 NOTE — Progress Notes (Signed)
   Covid-19 Vaccination Clinic  Name:  Cody Gonzalez    MRN: [id number] DOB: [birth year]  07/26/2021  Mr. Cody Gonzalez was observed post Covid-19 immunization for 15 minutes without incident. He was provided with Vaccine Information Sheet and instruction to access the V-Safe system.   Mr. Cody Gonzalez was instructed to call 911 with any severe reactions post vaccine: Difficulty breathing  Swelling of face and throat  A fast heartbeat  A bad rash all over body  Dizziness and weakness

## 2021-07-29 ENCOUNTER — Other Ambulatory Visit: Payer: Self-pay

## 2021-07-29 ENCOUNTER — Other Ambulatory Visit (HOSPITAL_BASED_OUTPATIENT_CLINIC_OR_DEPARTMENT_OTHER): Payer: Self-pay

## 2021-07-29 ENCOUNTER — Encounter: Payer: Self-pay | Admitting: Podiatry

## 2021-07-29 ENCOUNTER — Ambulatory Visit (INDEPENDENT_AMBULATORY_CARE_PROVIDER_SITE_OTHER): Payer: Medicare Other | Admitting: Podiatry

## 2021-07-29 DIAGNOSIS — M79674 Pain in right toe(s): Secondary | ICD-10-CM

## 2021-07-29 DIAGNOSIS — B351 Tinea unguium: Secondary | ICD-10-CM

## 2021-07-29 DIAGNOSIS — M79675 Pain in left toe(s): Secondary | ICD-10-CM | POA: Diagnosis not present

## 2021-07-29 NOTE — Progress Notes (Signed)
This patient returns to the office for evaluation and treatment of long thick painful nails .  This patient is unable to trim his own nails since the patient cannot reach his feet.  Patient says the nails are painful walking and wearing his shoes.  He returns for preventive foot care services.  General Appearance  Alert, conversant and in no acute stress.  Vascular  Dorsalis pedis and posterior tibial  pulses are palpable  bilaterally.  Capillary return is within normal limits  bilaterally. Temperature is within normal limits  bilaterally.  Neurologic  Senn-Weinstein monofilament wire test within normal limits  bilaterally. Muscle power within normal limits bilaterally.  Nails Thick disfigured discolored nails with subungual debris  from hallux to fifth toes bilaterally. No evidence of bacterial infection or drainage bilaterally.  Orthopedic  No limitations of motion  feet .  No crepitus or effusions noted.  No bony pathology or digital deformities noted.  Skin  normotropic skin with no porokeratosis noted bilaterally.  No signs of infections or ulcers noted.     Onychomycosis  Pain in toes right foot  Pain in toes left foot  Debridement  of nails  1-5  B/L with a nail nipper.  Nails were then filed using a dremel tool with no incidents.    RTC  3 months    Talayah Picardi DPM  

## 2021-10-24 ENCOUNTER — Ambulatory Visit: Payer: Medicare Other | Admitting: Neurology

## 2021-10-29 ENCOUNTER — Ambulatory Visit: Payer: Medicare Other | Admitting: Podiatry

## 2021-10-30 ENCOUNTER — Other Ambulatory Visit: Payer: Self-pay | Admitting: Neurosurgery

## 2021-11-06 ENCOUNTER — Ambulatory Visit (INDEPENDENT_AMBULATORY_CARE_PROVIDER_SITE_OTHER): Payer: Medicare Other | Admitting: Podiatry

## 2021-11-06 ENCOUNTER — Encounter: Payer: Self-pay | Admitting: Podiatry

## 2021-11-06 ENCOUNTER — Other Ambulatory Visit: Payer: Self-pay

## 2021-11-06 DIAGNOSIS — M79675 Pain in left toe(s): Secondary | ICD-10-CM

## 2021-11-06 DIAGNOSIS — M79674 Pain in right toe(s): Secondary | ICD-10-CM | POA: Diagnosis not present

## 2021-11-06 DIAGNOSIS — B351 Tinea unguium: Secondary | ICD-10-CM | POA: Diagnosis not present

## 2021-11-06 NOTE — Progress Notes (Signed)
This patient returns to the office for evaluation and treatment of long thick painful nails .  This patient is unable to trim his own nails since the patient cannot reach his feet.  Patient says the nails are painful walking and wearing his shoes.  He returns for preventive foot care services.  General Appearance  Alert, conversant and in no acute stress.  Vascular  Dorsalis pedis and posterior tibial  pulses are palpable  bilaterally.  Capillary return is within normal limits  bilaterally. Temperature is within normal limits  bilaterally.  Neurologic  Senn-Weinstein monofilament wire test within normal limits  bilaterally. Muscle power within normal limits bilaterally.  Nails Thick disfigured discolored nails with subungual debris  from hallux to fifth toes bilaterally. No evidence of bacterial infection or drainage bilaterally.  Orthopedic  No limitations of motion  feet .  No crepitus or effusions noted.  No bony pathology or digital deformities noted.  Skin  normotropic skin with no porokeratosis noted bilaterally.  No signs of infections or ulcers noted.     Onychomycosis  Pain in toes right foot  Pain in toes left foot  Debridement  of nails  1-5  B/L with a nail nipper.  Nails were then filed using a dremel tool with no incidents.    RTC  3 months    Gregory Mayer DPM  

## 2021-11-06 NOTE — Progress Notes (Signed)
This patient returns to the office for evaluation and treatment of long thick painful nails .  This patient is unable to trim his own nails since the patient cannot reach his feet.  Patient says the nails are painful walking and wearing his shoes.  He returns for preventive foot care services.  General Appearance  Alert, conversant and in no acute stress.  Vascular  Dorsalis pedis and posterior tibial  pulses are palpable  bilaterally.  Capillary return is within normal limits  bilaterally. Temperature is within normal limits  bilaterally.  Neurologic  Senn-Weinstein monofilament wire test within normal limits  bilaterally. Muscle power within normal limits bilaterally.  Nails Thick disfigured discolored nails with subungual debris  from hallux to fifth toes bilaterally. No evidence of bacterial infection or drainage bilaterally.  Orthopedic  No limitations of motion  feet .  No crepitus or effusions noted.  No bony pathology or digital deformities noted.  Skin  normotropic skin with no porokeratosis noted bilaterally.  No signs of infections or ulcers noted.     Onychomycosis  Pain in toes right foot  Pain in toes left foot  Debridement  of nails  1-5  B/L with a nail nipper.  Nails were then filed using a dremel tool with no incidents.    RTC  3 months    Lylian Sanagustin DPM  

## 2021-11-15 ENCOUNTER — Ambulatory Visit: Admit: 2021-11-15 | Payer: Medicare Other | Admitting: Neurosurgery

## 2021-11-15 SURGERY — LUMBAR LAMINECTOMY/DECOMPRESSION MICRODISCECTOMY 1 LEVEL
Anesthesia: General | Site: Back | Laterality: Left

## 2021-12-14 ENCOUNTER — Ambulatory Visit (HOSPITAL_COMMUNITY): Admission: EM | Admit: 2021-12-14 | Payer: Self-pay

## 2021-12-14 ENCOUNTER — Emergency Department (HOSPITAL_COMMUNITY): Payer: Medicare Other

## 2021-12-14 ENCOUNTER — Other Ambulatory Visit: Payer: Self-pay

## 2021-12-14 ENCOUNTER — Encounter (HOSPITAL_COMMUNITY): Payer: Self-pay | Admitting: Emergency Medicine

## 2021-12-14 ENCOUNTER — Emergency Department (HOSPITAL_BASED_OUTPATIENT_CLINIC_OR_DEPARTMENT_OTHER): Payer: Medicare Other

## 2021-12-14 ENCOUNTER — Emergency Department (HOSPITAL_COMMUNITY)
Admission: EM | Admit: 2021-12-14 | Discharge: 2021-12-15 | Disposition: A | Discharge status: Nursing Home (Includes ICF) | Payer: Medicare Other | Attending: Emergency Medicine | Admitting: Emergency Medicine

## 2021-12-14 DIAGNOSIS — M549 Dorsalgia, unspecified: Secondary | ICD-10-CM | POA: Diagnosis not present

## 2021-12-14 DIAGNOSIS — M545 Low back pain, unspecified: Secondary | ICD-10-CM

## 2021-12-14 DIAGNOSIS — D72829 Elevated white blood cell count, unspecified: Secondary | ICD-10-CM | POA: Diagnosis not present

## 2021-12-14 DIAGNOSIS — E871 Hypo-osmolality and hyponatremia: Secondary | ICD-10-CM | POA: Diagnosis not present

## 2021-12-14 DIAGNOSIS — E872 Acidosis, unspecified: Secondary | ICD-10-CM

## 2021-12-14 DIAGNOSIS — M7989 Other specified soft tissue disorders: Secondary | ICD-10-CM | POA: Diagnosis not present

## 2021-12-14 DIAGNOSIS — Z20822 Contact with and (suspected) exposure to covid-19: Secondary | ICD-10-CM | POA: Insufficient documentation

## 2021-12-14 LAB — URINALYSIS, ROUTINE W REFLEX MICROSCOPIC
Bilirubin Urine: NEGATIVE
Glucose, UA: NEGATIVE mg/dL
Hgb urine dipstick: NEGATIVE
Ketones, ur: NEGATIVE mg/dL
Leukocytes,Ua: NEGATIVE
Nitrite: NEGATIVE
Protein, ur: NEGATIVE mg/dL
Specific Gravity, Urine: 1.019 (ref 1.005–1.030)
pH: 6 (ref 5.0–8.0)

## 2021-12-14 LAB — RESP PANEL BY RT-PCR (FLU A&B, COVID) ARPGX2
Influenza A by PCR: NEGATIVE
Influenza B by PCR: NEGATIVE
SARS Coronavirus 2 by RT PCR: NEGATIVE

## 2021-12-14 LAB — CBC WITH DIFFERENTIAL/PLATELET
Abs Immature Granulocytes: 0.07 10*3/uL (ref 0.00–0.07)
Basophils Absolute: 0 10*3/uL (ref 0.0–0.1)
Basophils Relative: 0 %
Eosinophils Absolute: 0 10*3/uL (ref 0.0–0.5)
Eosinophils Relative: 0 %
HCT: 26.2 % — ABNORMAL LOW (ref 39.0–52.0)
Hemoglobin: 8.7 g/dL — ABNORMAL LOW (ref 13.0–17.0)
Immature Granulocytes: 1 %
Lymphocytes Relative: 3 %
Lymphs Abs: 0.3 10*3/uL — ABNORMAL LOW (ref 0.7–4.0)
MCH: 33.1 pg (ref 26.0–34.0)
MCHC: 33.2 g/dL (ref 30.0–36.0)
MCV: 99.6 fL (ref 80.0–100.0)
Monocytes Absolute: 0.8 10*3/uL (ref 0.1–1.0)
Monocytes Relative: 6 %
Neutro Abs: 10.6 10*3/uL — ABNORMAL HIGH (ref 1.7–7.7)
Neutrophils Relative %: 90 %
Platelets: 428 10*3/uL — ABNORMAL HIGH (ref 150–400)
RBC: 2.63 MIL/uL — ABNORMAL LOW (ref 4.22–5.81)
RDW: 13.2 % (ref 11.5–15.5)
WBC: 11.8 10*3/uL — ABNORMAL HIGH (ref 4.0–10.5)
nRBC: 0 % (ref 0.0–0.2)

## 2021-12-14 LAB — BASIC METABOLIC PANEL
Anion gap: 13 (ref 5–15)
BUN: 23 mg/dL (ref 8–23)
CO2: 24 mmol/L (ref 22–32)
Calcium: 9 mg/dL (ref 8.9–10.3)
Chloride: 88 mmol/L — ABNORMAL LOW (ref 98–111)
Creatinine, Ser: 1.17 mg/dL (ref 0.61–1.24)
GFR, Estimated: >60 mL/min (ref >60)
Glucose, Bld: 142 mg/dL — ABNORMAL HIGH (ref 70–99)
Potassium: 4.2 mmol/L (ref 3.5–5.1)
Sodium: 125 mmol/L — ABNORMAL LOW (ref 135–145)

## 2021-12-14 LAB — LACTIC ACID, PLASMA
Lactic Acid, Venous: 4.3 mmol/L (ref 0.5–1.9)
Lactic Acid, Venous: 1.4 mmol/L (ref 0.5–1.9)

## 2021-12-14 IMAGING — DX DG LUMBAR SPINE COMPLETE 4+V
5 series · 5 of 5 positions shown · non-contrast
Comparison: [DATE]

CLINICAL DATA: Back pain.  Lumbar fusion on [DATE]

EXAM:
LUMBAR SPINE - COMPLETE 4+ VIEW

[l-spine ap]
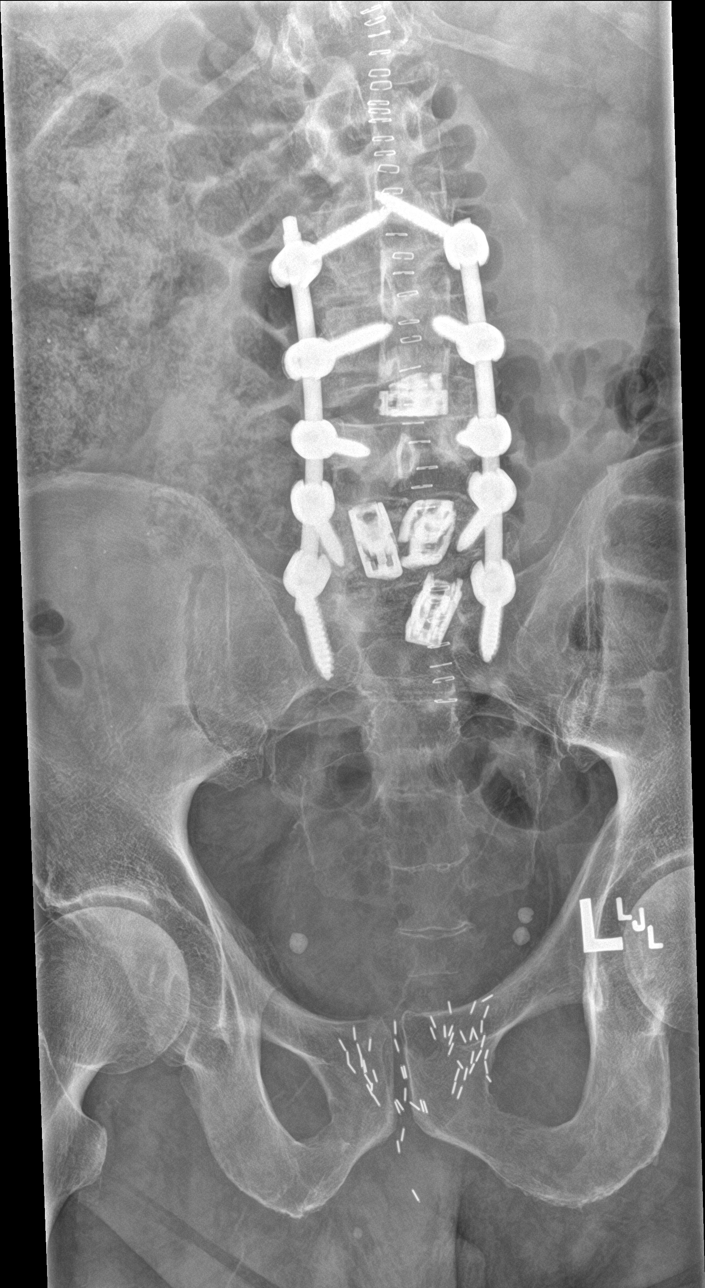

[l-spine obl (1 of 2)]
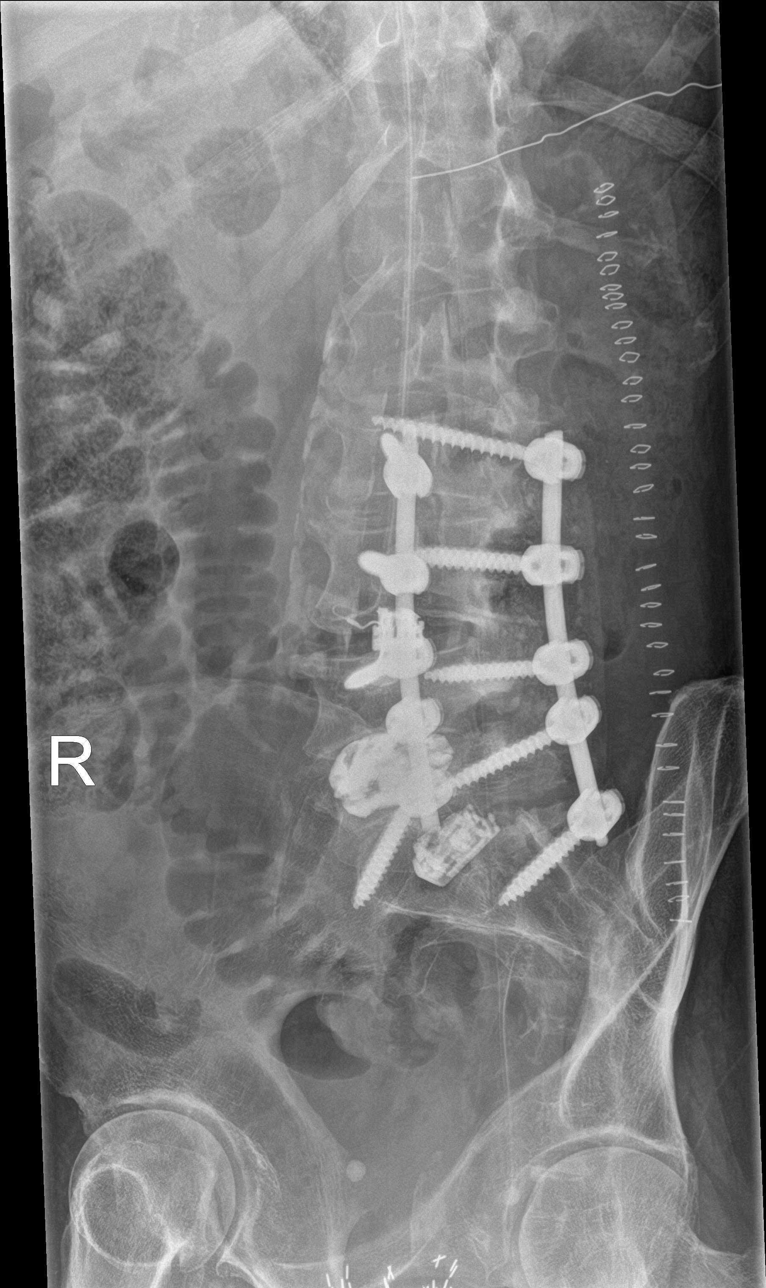

[l-spine obl (2 of 2)]
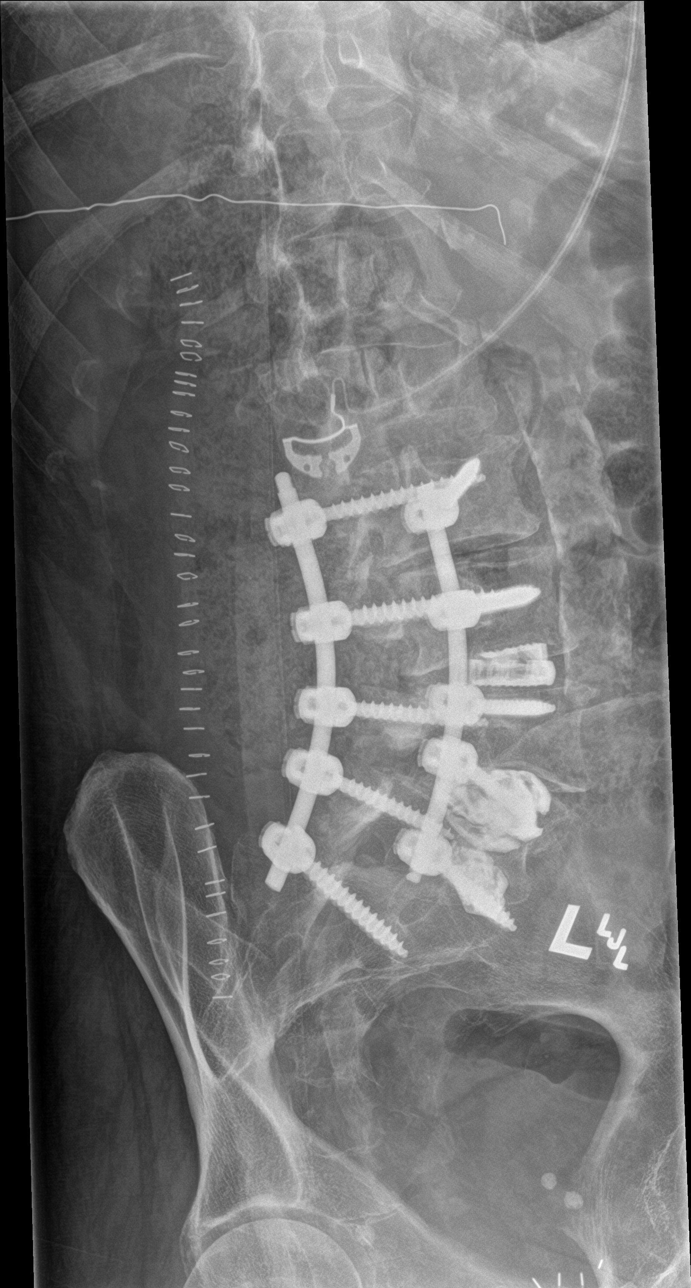

[l-spine lat]
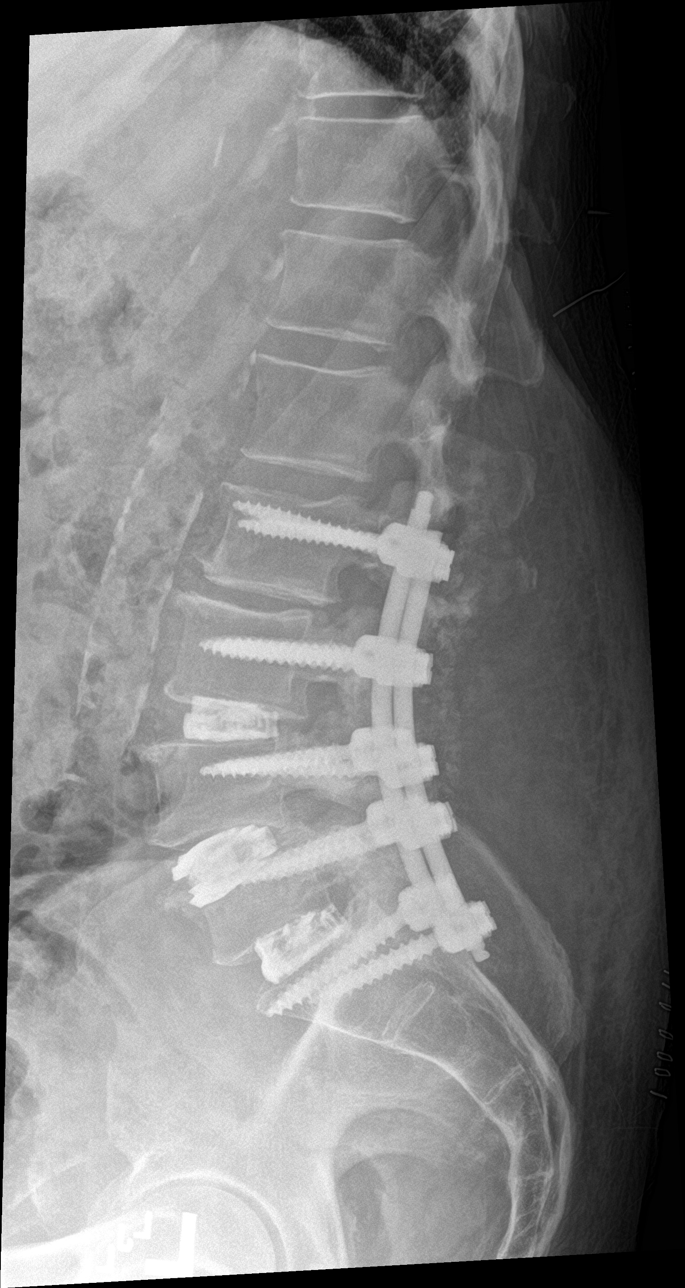

[l-spine spot]
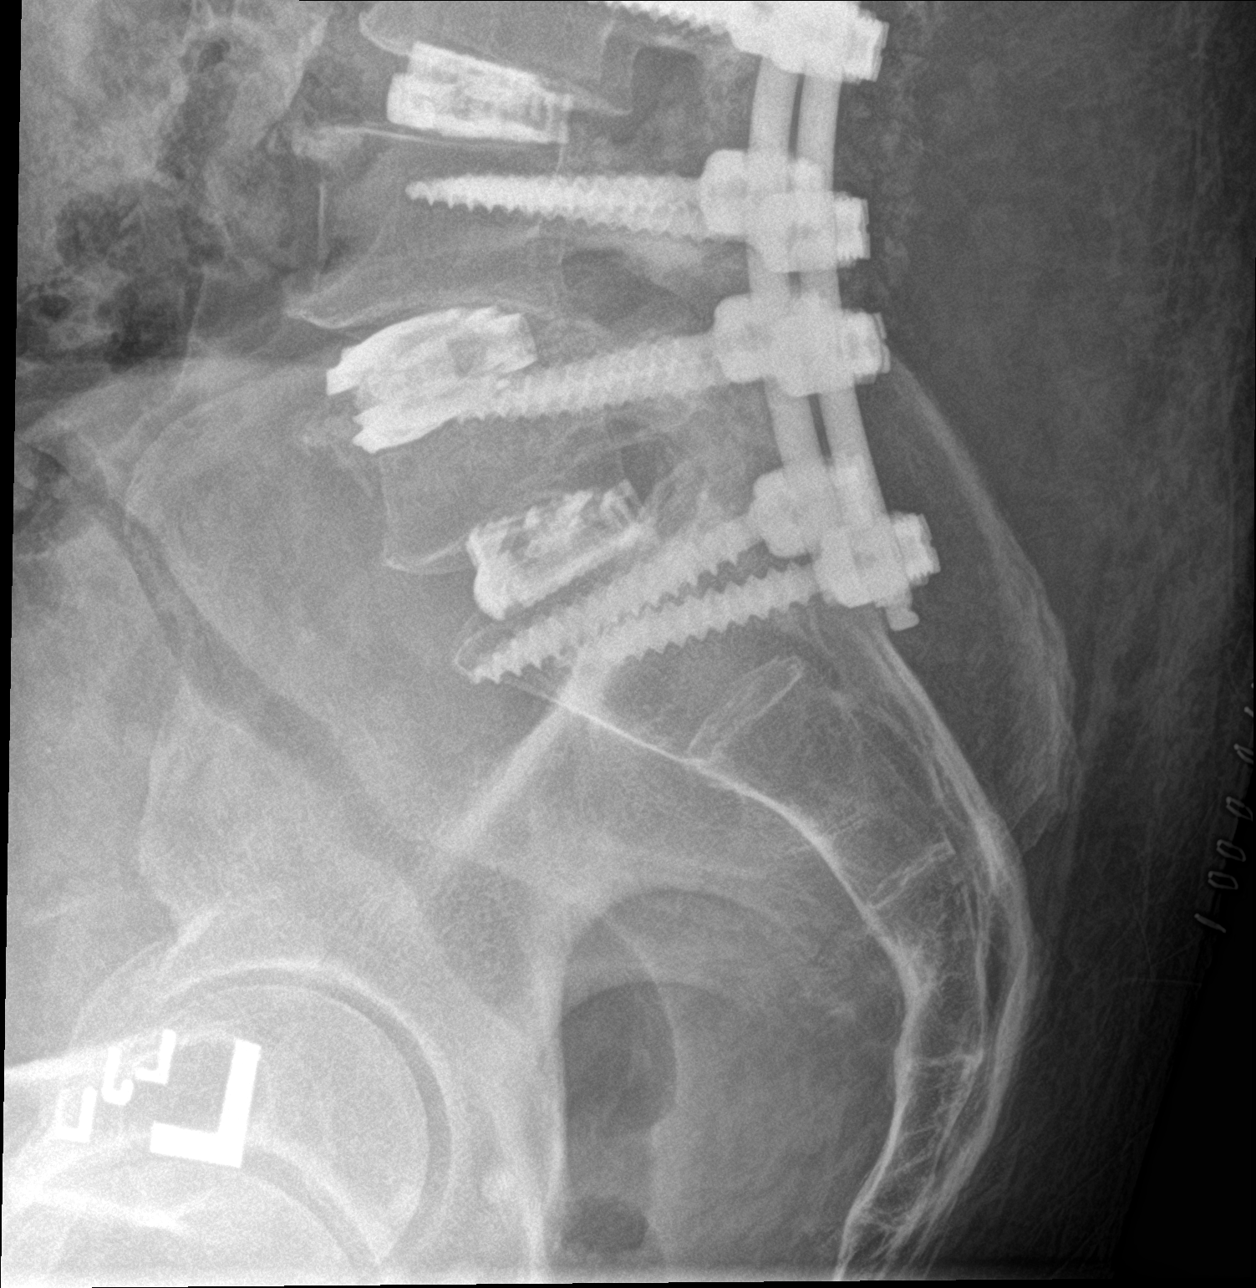

[5 of 5 positions shown; findings below may reference images not displayed]

FINDINGS: Postsurgical changes from posterior fusion and decompression with
rod and screw fixation hardware extending from L2 to S1 and
interbody spacers at L3-4, L4-5, and L5-S1. No apparent
postoperative complication radiographically. No evidence of
fracture. Overlying skin staples. Dense abdominal aortic
atherosclerotic calcification.
IMPRESSION: Postsurgical changes from posterior fusion L2 to S1 without evidence
of hardware complication.

## 2021-12-14 IMAGING — DX DG CHEST 1V PORT
1 series · 1 of 1 positions shown · non-contrast
Comparison: [DATE].

CLINICAL DATA: Chest pain radiating to the back. Recent spine
surgery.

EXAM:
PORTABLE CHEST 1 VIEW

[chest]
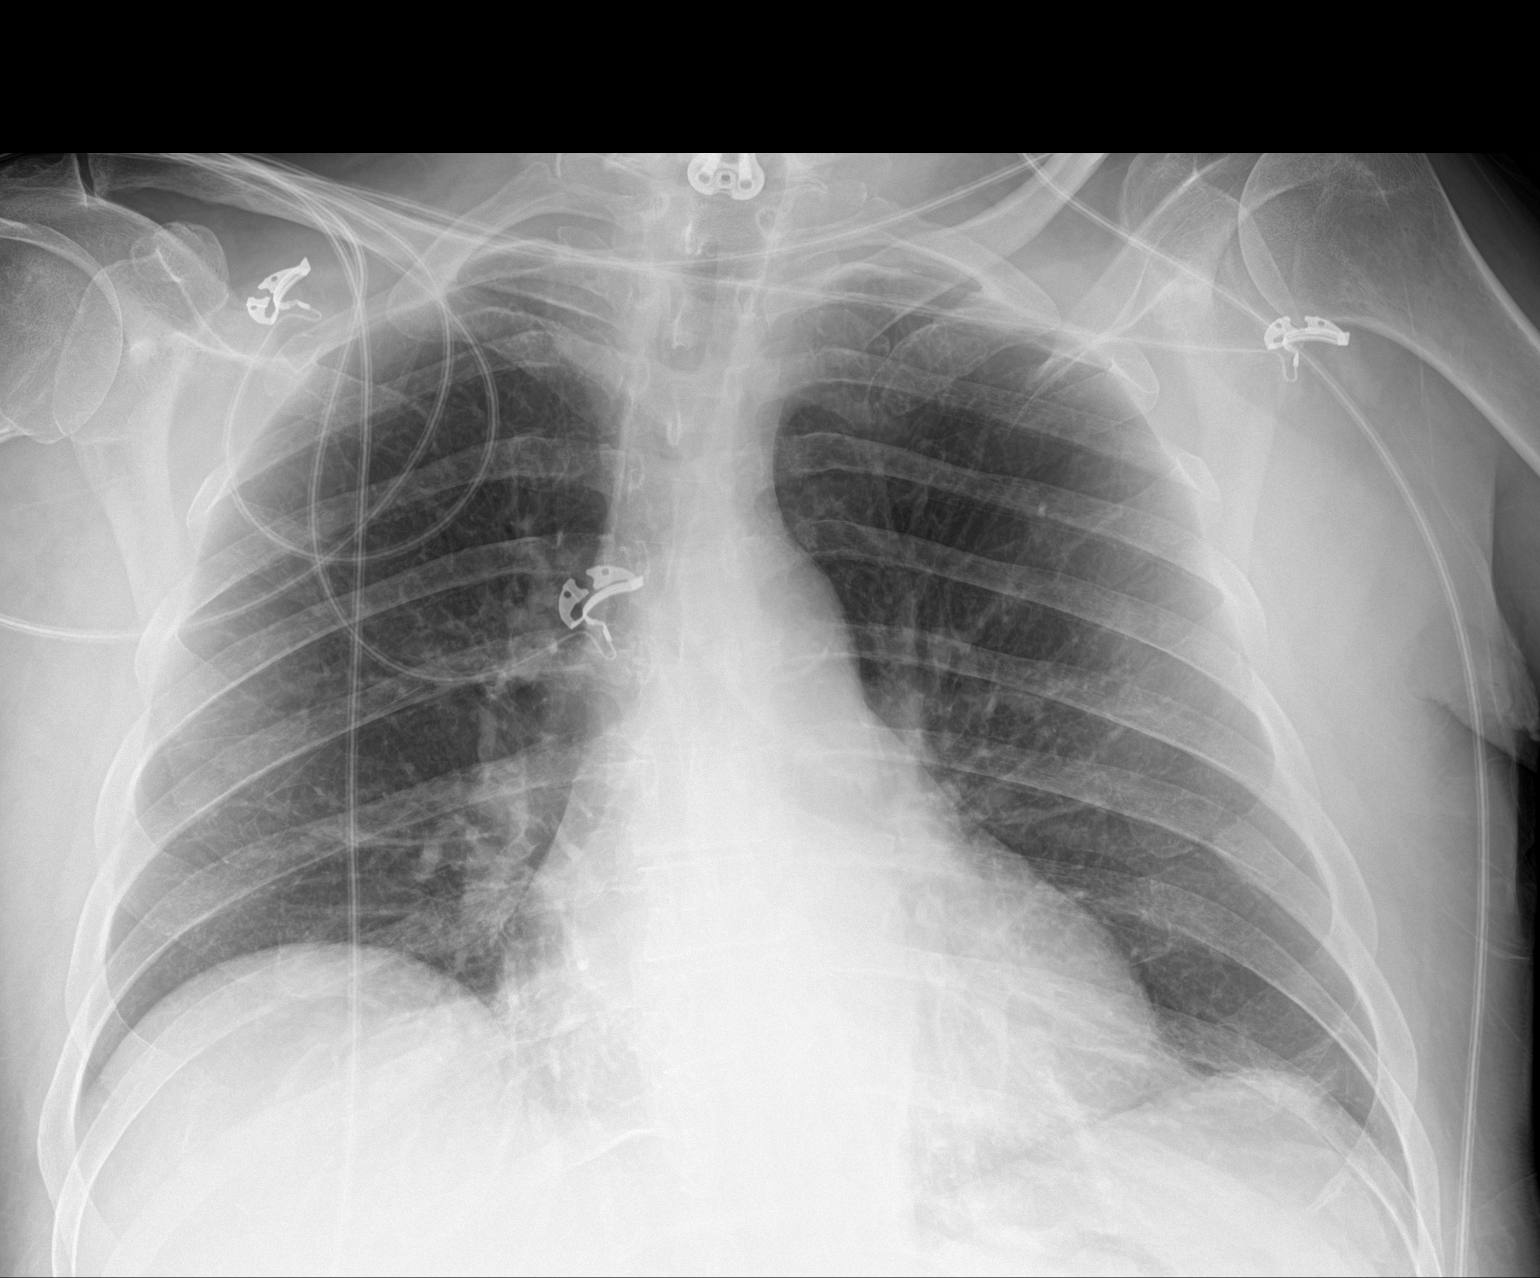

[1 of 1 positions shown; findings below may reference images not displayed]

FINDINGS: Cardiac silhouette is normal in size. Normal mediastinal and hilar
contours.

Clear lungs.  No pleural effusion or pneumothorax.

Previous anterior cervical spine fusion, new since the prior
radiographs. Skeletal structures are grossly intact.
IMPRESSION: No active disease.

## 2021-12-14 IMAGING — MR MR LUMBAR SPINE WO/W CM
4 of 8 series · 22 of 48 positions shown · IV contrast (gadavist)
Comparison: Radiograph from earlier the same day.

CLINICAL DATA: Initial evaluation for severe back pain,
leukocytosis, recent surgery.

EXAM:
MRI LUMBAR SPINE WITHOUT AND WITH CONTRAST
TECHNIQUE: Multiplanar and multiecho pulse sequences of the lumbar spine were
obtained without and with intravenous contrast.
CONTRAST:  7mL GADAVIST GADOBUTROL 1 MMOL/ML IV SOLN

[Series 5: T2 · sagittal · 4.0mm · 0.73mm/px · 5 of 17 slices shown (1 of 2)]
[im 1/17]
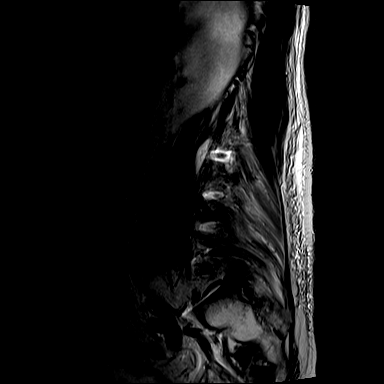
[im 5/17]
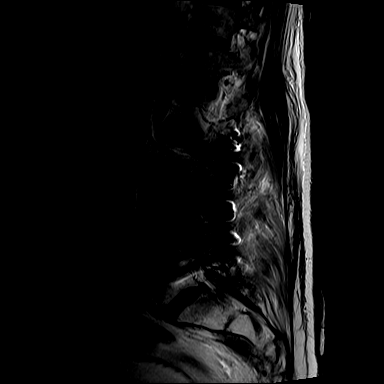
[im 9/17]
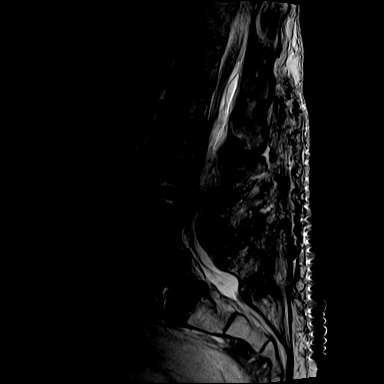
[im 13/17]
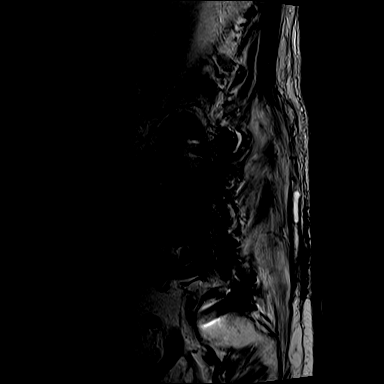
[im 17/17]
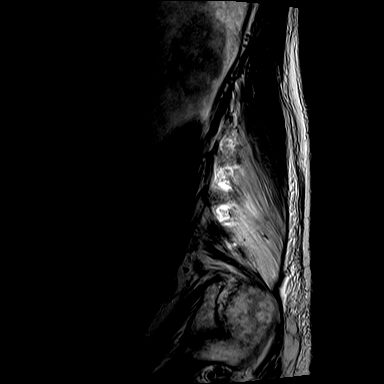

[Series 7: T1 · sagittal · 4.0mm · 0.88mm/px · 4 of 17 slices shown (1 of 2)]
[im 1/17]
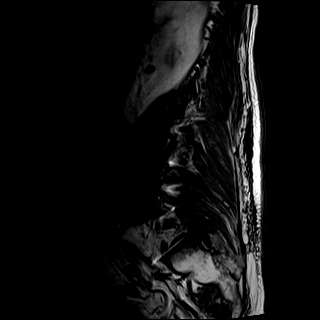
[im 6/17]
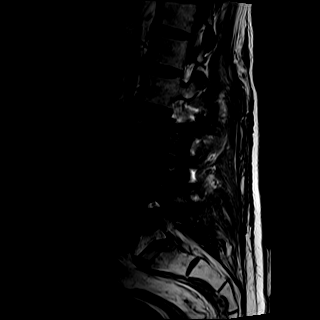
[im 11/17]
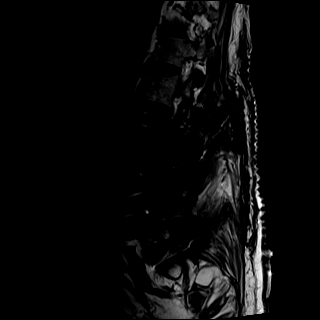
[im 17/17]
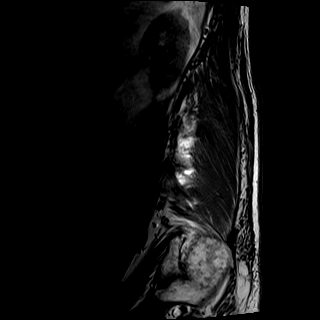

[Series 8: T2 · axial · 4.0mm · 0.57mm/px · z∈[-107,+87]mm · 9 of 37 slices shown (2 of 2)]
[im 1/37]
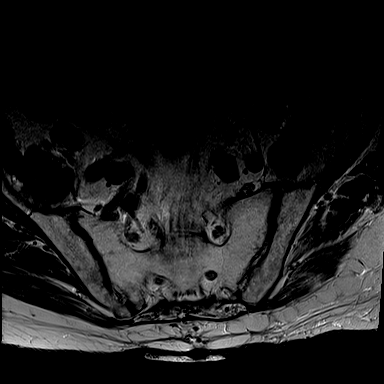
[im 5/37]
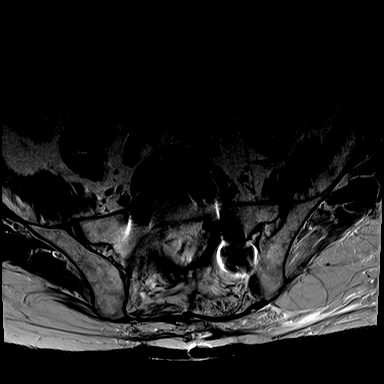
[im 10/37]
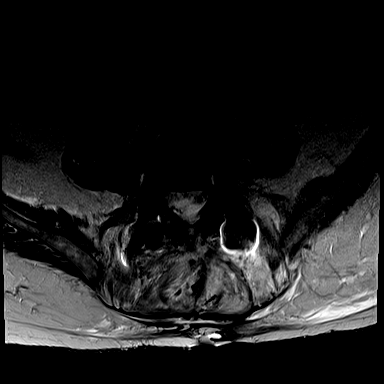
[im 14/37]
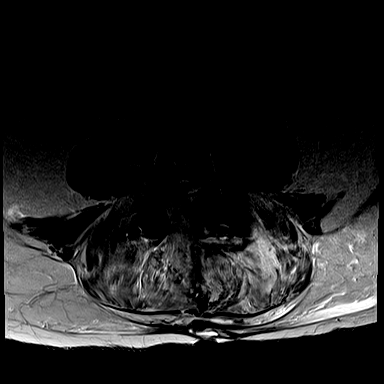
[im 19/37]
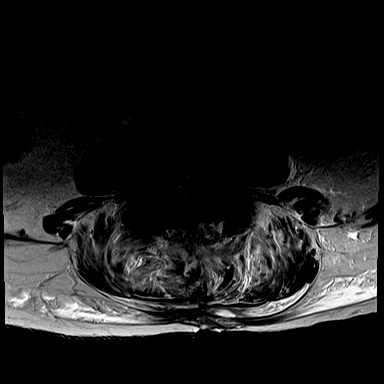
[im 23/37]
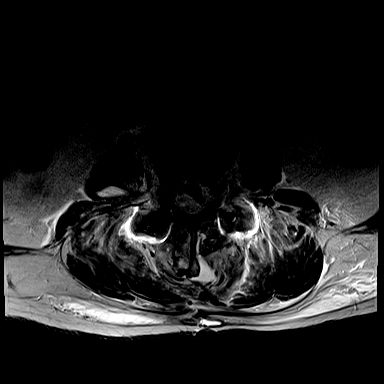
[im 28/37]
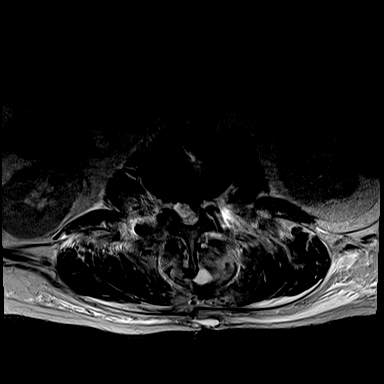
[im 32/37]
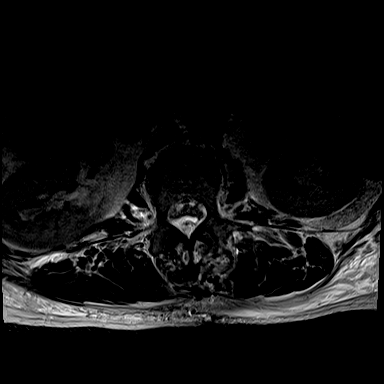
[im 37/37]
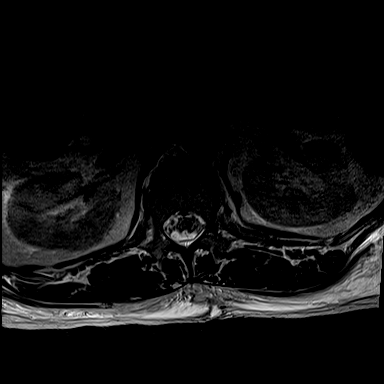

[Series 9: T1 · axial · 4.0mm · 0.34mm/px · z∈[-107,+62]mm · 4 of 37 slices shown (2 of 2)]
[im 1/37]
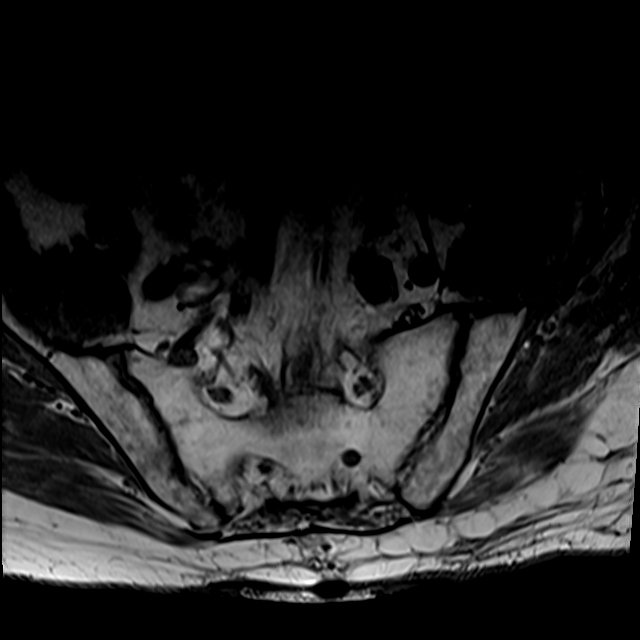
[im 5/37]
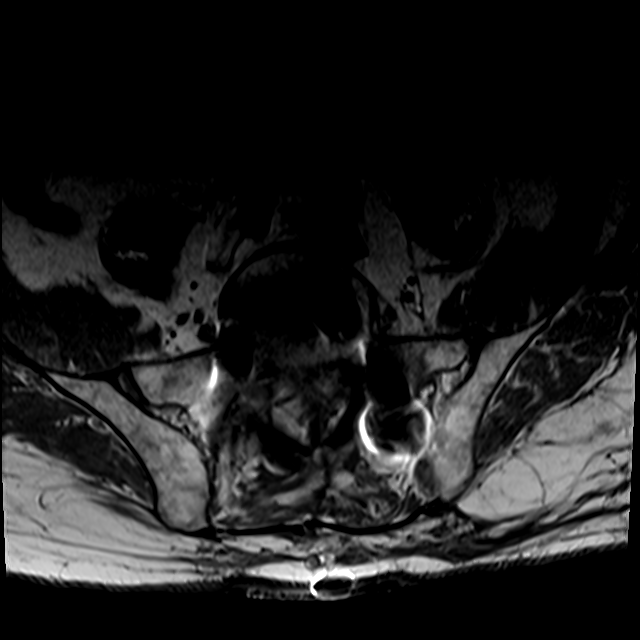
[im 19/37]
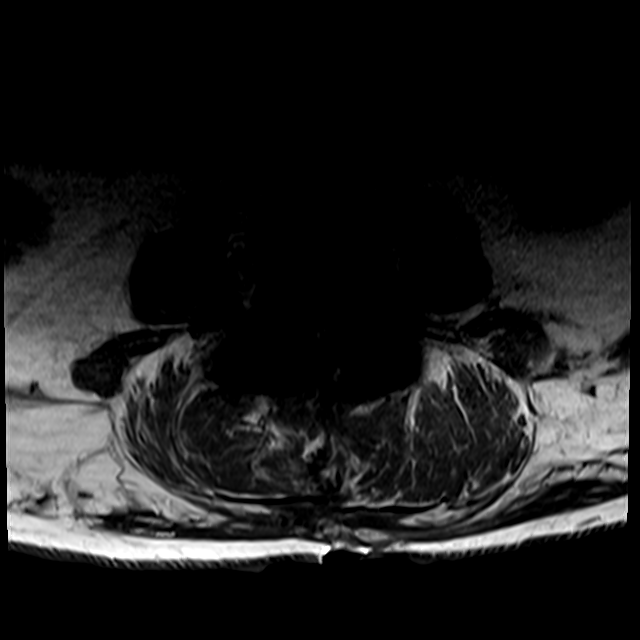
[im 32/37]
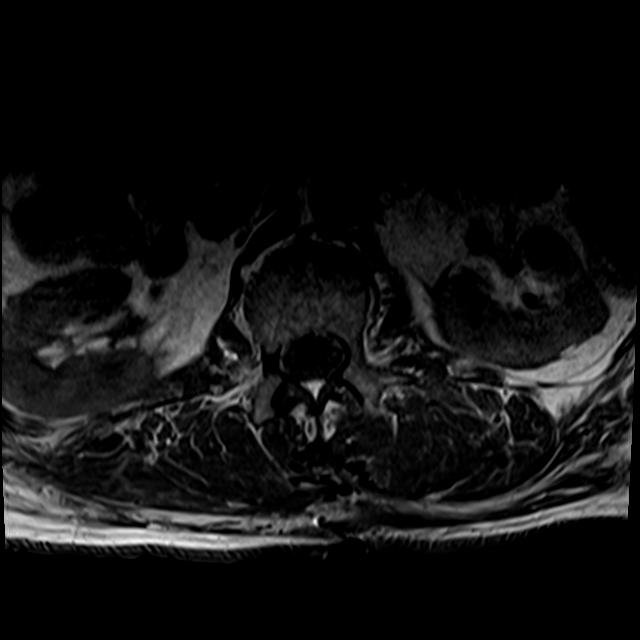

[22 of 48 positions shown; findings below may reference images not displayed]

FINDINGS: Segmentation: Standard. Lowest well-formed disc space labeled the
L5-S1 level.

Alignment: 5 mm anterolisthesis of L4 on L5, with trace
retrolisthesis of L2 on L3 and L3 on L4.

Vertebrae: Susceptibility artifact related to recent posterior
fusion at L2 through the sacrum. Prior interbody fusion at L3-4,
L4-5, and L5-S1. Vertebral body height maintained without acute or
interval fracture. No convincing evidence for osteomyelitis discitis
or septic arthritis.

Conus medullaris and cauda equina: Conus extends to the L1 level.
Mass effect on the distal conus and nerve roots of the cauda equina
due to an epidural collection as described below.

Paraspinal and other soft tissues: Heterogeneous signal abnormality
seen throughout the posterior paraspinous soft tissues related to
recent surgery. Skin staples remain in place. There is greater than
expected edema and enhancement throughout the posterior paraspinous
musculature, concerning for infection. Residual collection along the
lower midline incision measures up to approximately 3.0 x 1.6 x
cm (series 8, image 15). Collection extends along the midline
incision to the overlying skin. Associated sub fascial involvement
towards the underlying laminectomy defects. While these findings may
in part be postoperative in nature, possible infection with abscess
formation could be considered given the correct clinical setting.
There is an additional rim enhancing collection at the posterior
aspect of the right psoas muscle measuring 2.4 x 1.1 cm (series 8,
image 13), also concerning for abscess.

Additionally, there is a heterogeneous collection involving the
dorsal epidural space extending from T12 to approximately L4-5,
concerning for possible abscess (series 5, image 9). This measures
up to approximately 10 mm in greatest AP diameter within the central
and right dorsal epidural space at the levels of T12-L1 through L2-3
(series 8, images 3-23). While this is likely at least in part
epidural in location, this appears to be at least partially
intradural in nature on postcontrast sequence (series 12, image 9
for example). Compression of the nerve roots of the cauda equina
anteriorly.

Disc levels:

T12-L1: Negative interspace. Epidural abscess within the dorsal
epidural space with mass effect on the posterior thecal sac.
Associated moderate to severe spinal stenosis, with the thecal sac
measuring 6-7 mm in AP diameter. Foramina remain patent.

L1-2: Minimal disc bulge. Epidural abscess within the central and
right dorsal epidural space with secondary mass effect of the thecal
sac anteriorly. Resultant severe canal with right worse than left
lateral recess stenosis (series 8, image 11). Underlying mild facet
hypertrophy. Foramina appear patent.

L2-3: Degenerative intervertebral disc space narrowing with diffuse
disc bulge. Probable epidural abscess involving the right greater
than left epidural space with compression of the thecal sac and
severe spinal stenosis. Thecal sac compressed measuring 5 mm in
transverse diameter at its most narrow point. Prior posterior fusion
with residual facet hypertrophy. Foramina appear patent.

L3-4: Prior posterior and interbody fusion with posterior
decompression. Probable epidural abscess within the dorsal epidural
space. Thecal sac is compressed measuring 5 mm in AP diameter at its
most narrow point (series 6, image 23). Residual moderate to severe
bilateral L3 foraminal stenosis.

L4-5: Anterolisthesis. Prior posterior and interbody fusion with
posterior decompression. The epidural abscess appears to not
significantly involve this level, with no significant spinal
stenosis. Residual moderate left with mild to moderate right L4
foraminal narrowing.

L5-S1: Sequelae of prior posterior and interbody fusion with
posterior decompression. Residual mild disc bulge. No significant
spinal stenosis. Foramina appear patent.
IMPRESSION: 1. Postoperative changes from recent posterior fusion and
decompression at L2 through the sacrum. Superimposed epidural and/or
intradural collection involving the dorsal epidural space and thecal
sac, extending from T12 through approximately L3-4, most concerning
for possible infection and abscess formation given provided history.
Resultant moderate to severe diffuse stenosis at T12-L1 through L3-4
with compression of the cauda equina. Emergent neuro surgical
consultation recommended.
2. Residual 3.0 x 1.6 x 5.6 cm postoperative collection along the
lower midline incision. While this may reflect a benign
postoperative seroma, superimposed infection could be considered as
well.
3. Additional separate 2.4 x 1.1 cm collection at the posterior
aspect of the right psoas muscle, concerning for intramuscular
abscess.

Critical Value/emergent results were called by telephone at the time
, who verbally acknowledged these results.

## 2021-12-14 MED ORDER — GADOBUTROL 1 MMOL/ML IV SOLN
7.0000 mL | Freq: Once | INTRAVENOUS | Status: AC | PRN
  Administered 2021-12-14: 7 mL via INTRAVENOUS

## 2021-12-14 MED ORDER — VANCOMYCIN HCL 1500 MG/300ML IV SOLN
1500.0000 mg | Freq: Once | INTRAVENOUS | Status: AC
Start: 2021-12-14 — End: 2021-12-14
  Administered 2021-12-14: 1500 mg via INTRAVENOUS
  Filled 2021-12-14: qty 300

## 2021-12-14 MED ORDER — VANCOMYCIN HCL 1250 MG/250ML IV SOLN: 1250.0000 mg | INTRAVENOUS | Status: DC

## 2021-12-14 MED ORDER — SODIUM CHLORIDE 0.9 % IV BOLUS (SEPSIS)
1000.0000 mL | Freq: Once | INTRAVENOUS | Status: AC
  Administered 2021-12-14: 1000 mL via INTRAVENOUS

## 2021-12-14 MED ORDER — SODIUM CHLORIDE 0.9 % IV BOLUS
1500.0000 mL | Freq: Once | INTRAVENOUS | Status: AC
  Administered 2021-12-14: 1500 mL via INTRAVENOUS

## 2021-12-14 MED ORDER — HYDROMORPHONE HCL 1 MG/ML IJ SOLN
1.0000 mg | Freq: Once | INTRAMUSCULAR | Status: AC
  Administered 2021-12-14: 1 mg via INTRAVENOUS
  Filled 2021-12-14: qty 1

## 2021-12-14 MED ORDER — SODIUM CHLORIDE 0.9 % IV SOLN
1.0000 g | Freq: Once | INTRAVENOUS | Status: AC
  Administered 2021-12-14: 1 g via INTRAVENOUS
  Filled 2021-12-14: qty 10

## 2021-12-14 MED ORDER — SODIUM CHLORIDE 0.9 % IV SOLN
1000.0000 mL | INTRAVENOUS | Status: DC
  Administered 2021-12-14: 1000 mL via INTRAVENOUS

## 2021-12-14 MED ORDER — FENTANYL CITRATE PF 50 MCG/ML IJ SOSY
100.0000 ug | PREFILLED_SYRINGE | INTRAMUSCULAR | Status: DC | PRN
  Administered 2021-12-14 – 2021-12-15 (×3): 100 ug via INTRAVENOUS
  Filled 2021-12-14 (×3): qty 2

## 2021-12-14 NOTE — Progress Notes (Signed)
VASCULAR LAB    Left lower extremity venous duplex has been performed.  See CV proc for preliminary results.  Messaged results to Dr. Sabra Heck via secure chat.    Aminat [place], RVT 12/14/2021, 6:45 PM

## 2021-12-14 NOTE — ED Notes (Signed)
Checked b/p x3, pt hypotensive in both arms

## 2021-12-14 NOTE — ED Notes (Signed)
Patient currently at MRI

## 2021-12-14 NOTE — Progress Notes (Signed)
Pharmacy Antibiotic Note  Cody Gonzalez is a 76 y.o. male admitted on 12/14/2021 with sepsis.  Pharmacy has been consulted for vancomycin dosing.  Patient with a history of recent lumber fusion at [facility]. Patient presenting with back pain. In ED noted to have increased pain, elevated WBC and tachcardia.  SCr 1.17 - at baseline WBC 11.8; LA 1.4; T 97.5 F; HR 78; RR 10-17 COVID/Flu neg  Plan: Ceftriaxone per MD Vancomycin 1500 mg once then 1250 mg q24hr (eAUC 489) unless change in renal function Trend WBC, Fever, Renal function, & Clinical course F/u cultures, clinical course, WBC, fever De-escalate when able Levels at steady state     Temp (24hrs), Avg:97.5 F (36.4 C), Min:97.5 F (36.4 C), Max:97.5 F (36.4 C)  Recent Labs  Lab 12/14/21 1235 12/14/21 1507  WBC 11.8*  --   CREATININE 1.17  --   LATICACIDVEN 4.3* 1.4    CrCl cannot be calculated (Unknown ideal weight.).    No Known Allergies  Antimicrobials this admission: ceftriaxone 2/11 >>  vancomycin 2/11 >>   Microbiology results: Pending  Thank you for allowing pharmacy to be a part of this patients care.  Lorelei Pont, PharmD, BCPS 12/14/2021 6:18 PM ED Clinical Pharmacist -  [phone]

## 2021-12-14 NOTE — Discharge Instructions (Signed)
ED via personal vehicle

## 2021-12-14 NOTE — ED Provider Notes (Signed)
[facility] EMERGENCY DEPARTMENT Provider Note   CSN: [id number] Arrival date & time: 12/14/21  1222     History  Chief Complaint  Patient presents with   Back Pain    SISTO GRANILLO is a 76 y.o. male.   Back Pain Associated symptoms: no fever    Patient presented to the ER for evaluation of low back pain.  Patient states he had spinal surgery at [facility] on January 30.  He had a lumbar fusion.  Surgery was performed at [facility].  I have reviewed the records from [facility].  Patient states in the last few days he has had increasing pain in his lower back.  He is now barely able to walk because of the pain.  He called his doctors at [facility] who asked him to go to a local facility to have x-rays to make sure the hardware is still in appropriate place.  Patient has had some leg swelling as well recently.  He denies any chest pain or shortness of breath.  Home Medications Prior to Admission medications   Medication Sig Start Date End Date Taking? Authorizing Provider  atorvastatin (LIPITOR) 40 MG tablet Take 40 mg by mouth daily. 09/01/19   [provider]  b complex vitamins capsule Take 1 capsule by mouth daily.    [provider]  CALCIUM PO Take 1 tablet by mouth daily.    [provider]  COVID-19 mRNA bivalent vaccine, Pfizer, (PFIZER COVID-19 VAC BIVALENT) injection Inject into the muscle. 07/26/21   Carlyle Basques, MD  ferrous sulfate 325 (65 FE) MG tablet Take 325 mg by mouth every other day.    [provider]  HYDROcodone-acetaminophen (NORCO) 10-325 MG tablet Take 1 tablet by mouth every 4 (four) hours as needed for moderate pain ((score 4 to 6)). 05/29/21   Earnie Larsson, MD  methocarbamol (ROBAXIN) 500 MG tablet Take 1 tablet (500 mg total) by mouth 4 (four) times daily. 05/29/21   Earnie Larsson, MD  tamsulosin (FLOMAX) 0.4 MG CAPS capsule TAKE 1 CAPSULE DAILY AFTER SUPPER. FOR UINARY URGENCY/FREQUENCY AFTERPROSTRATE  RADIATION. Patient taking differently: Take 0.4 mg by mouth daily after supper. 11/27/19   Tyler Pita, MD  telmisartan (MICARDIS) 80 MG tablet Take 80 mg by mouth daily. 02/25/21   [provider]      Allergies    Patient has no known allergies.    Review of Systems   Review of Systems  Constitutional:  Negative for fever.  Musculoskeletal:  Positive for back pain.   Physical Exam Updated Vital Signs BP 134/60    Pulse 78    Temp (!) 97.5 F (36.4 C) (Oral)    Resp 11    SpO2 92%  Physical Exam Vitals and nursing note reviewed.  Constitutional:      General: He is not in acute distress.    Appearance: He is well-developed.  HENT:     Head: Normocephalic and atraumatic.     Right Ear: External ear normal.     Left Ear: External ear normal.  Eyes:     General: No scleral icterus.       Right eye: No discharge.        Left eye: No discharge.     Conjunctiva/sclera: Conjunctivae normal.  Neck:     Trachea: No tracheal deviation.  Cardiovascular:     Rate and Rhythm: Normal rate and regular rhythm.  Pulmonary:     Effort: Pulmonary effort is normal.  No respiratory distress.     Breath sounds: Normal breath sounds. No stridor. No wheezing or rales.  Abdominal:     General: Bowel sounds are normal. There is no distension.     Palpations: Abdomen is soft.     Tenderness: There is no abdominal tenderness. There is no guarding or rebound.  Musculoskeletal:        General: Tenderness present. No deformity.     Cervical back: Neck supple.     Comments: Tenderness palpation lumbar spine region  Skin:    General: Skin is warm and dry.     Findings: No rash.  Neurological:     General: No focal deficit present.     Mental Status: He is alert.     Cranial Nerves: No cranial nerve deficit (no facial droop, extraocular movements intact, no slurred speech).     Sensory: No sensory deficit.     Motor: No abnormal muscle tone or seizure activity.     Coordination:  Coordination normal.     Comments: Normal strength and sensation bilateral lower extremities  Psychiatric:        Mood and Affect: Mood normal.    ED Results / Procedures / Treatments   Labs (all labs ordered are listed, but only abnormal results are displayed) Labs Reviewed  CBC WITH DIFFERENTIAL/PLATELET - Abnormal; Notable for the following components:      Result Value   WBC 11.8 (*)    RBC 2.63 (*)    Hemoglobin 8.7 (*)    HCT 26.2 (*)    Platelets 428 (*)    Neutro Abs 10.6 (*)    Lymphs Abs 0.3 (*)    All other components within normal limits  BASIC METABOLIC PANEL - Abnormal; Notable for the following components:   Sodium 125 (*)    Chloride 88 (*)    Glucose, Bld 142 (*)    All other components within normal limits  LACTIC ACID, PLASMA - Abnormal; Notable for the following components:   Lactic Acid, Venous 4.3 (*)    All other components within normal limits  URINALYSIS, ROUTINE W REFLEX MICROSCOPIC - Abnormal; Notable for the following components:   Color, Urine AMBER (*)    APPearance HAZY (*)    All other components within normal limits  CULTURE, BLOOD (ROUTINE X 2)  CULTURE, BLOOD (ROUTINE X 2)  RESP PANEL BY RT-PCR (FLU A&B, COVID) ARPGX2  LACTIC ACID, PLASMA    EKG None  Radiology DG Lumbar Spine Complete  Result Date: 12/14/2021 CLINICAL DATA:  Back pain.  Lumbar fusion on 12/09/2021 EXAM: LUMBAR SPINE - COMPLETE 4+ VIEW COMPARISON:  10/24/2021 FINDINGS: Postsurgical changes from posterior fusion and decompression with rod and screw fixation hardware extending from L2 to S1 and interbody spacers at L3-4, L4-5, and L5-S1. No apparent postoperative complication radiographically. No evidence of fracture. Overlying skin staples. Dense abdominal aortic atherosclerotic calcification. IMPRESSION: Postsurgical changes from posterior fusion L2 to S1 without evidence of hardware complication. Electronically Signed   By: Davina Poke D.O.   On: 12/14/2021 13:50     Procedures Procedures    Medications Ordered in ED Medications  sodium chloride 0.9 % bolus 1,000 mL (0 mLs Intravenous Stopped 12/14/21 1419)    Followed by  0.9 %  sodium chloride infusion (has no administration in time range)  cefTRIAXone (ROCEPHIN) 1 g in sodium chloride 0.9 % 100 mL IVPB (1 g Intravenous New Bag/Given 12/14/21 1506)  HYDROmorphone (DILAUDID) injection 1 mg (1 mg  Intravenous Given 12/14/21 1303)  sodium chloride 0.9 % bolus 1,500 mL (1,500 mLs Intravenous New Bag/Given 12/14/21 1506)  HYDROmorphone (DILAUDID) injection 1 mg (1 mg Intravenous Given 12/14/21 1505)    ED Course/ Medical Decision Making/ A&P Clinical Course as of 12/14/21 1602  Sat Dec 14, 2021  1251 Blood pressure is now 264 systolic at the bedside [JK]  1336 Notified that lactic acid level is elevated at 4.3. [JK]  1337 Records reviewed from [facility].  His hemoglobin was 8.2 on February 3, 8.7 today is stable [JK]  1583 Basic metabolic panel(!) Sodium level was 127 8 days ago at [facility], not significantly changed here today [JK]  1403 X-rays of the lumbar spine show postsurgical changes without any acute evidence of complication [JK]  0940 Lactic acid elevated however patient not displaying overt signs of infection. [JK]  1430 Urinalysis without signs of infection [JK]  1504 Chest x-ray reviewed.  No signs of pneumonia on my preliminary evaluation [HW]  8088 Patient still having persistent pain.  Requires additional dose of narcotics [PJ]  0315 SPoke with Transfer service at [facility].   MD on call will call us back [JK]    Clinical Course User Index [JK] Dorie Rank, MD                           Medical Decision Making Amount and/or Complexity of Data Reviewed Labs: ordered. Decision-making details documented in ED Course. Radiology: ordered.  Risk Prescription drug management.   Patient presented to the ER for evaluation of back pain after recent surgery at [facility].  Patient's symptoms are not managed  with his oral pain medications.  He has required several doses of IV narcotics.  X-rays do not show any acute findings.  Patient did present with hypotension initially.  Blood pressure has improved with IV fluids.  Concerned about the possibility of evolving sepsis causing his hypotension however no obvious sign of infection at this time.  He does have a lactic acidosis.  He was given IV fluids will repeat.  Patient is requesting admission to the hospital for pain management.  Certainly will need to monitor his lactic acid level and he was given a dose of empiric antibiotics.  I will consult with [facility] orthopedic spine service as that is where he had his surgery  Transfer service contacted.  Waiting for call back.  Dr Sabra Heck will follow up on the call       Final Clinical Impression(s) / ED Diagnoses Final diagnoses:  Acute low back pain without sciatica, unspecified back pain laterality  Lactic acid acidosis     Dorie Rank, MD 12/14/21 [phone]
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
## 2021-12-14 NOTE — ED Provider Notes (Signed)
Change of shift - care assumed from Dr. Tomi Bamberger for back pain =- recent surgery at [place] - lumbar fusion by Dr. Dwaine Gale - now with increased pain, elevated WBC and tachcardia -transfer center finally called back at 5:50 PM and recommend MRI with and without contrast of the lumbar spine prior to potential transfer, the surgeon has requested these images prior to excepting patient in transfer  I have added vancomycin to the Rocephin in case this is MRSA 5:53 PM  At change of shift at 11:00 PM care was signed over to oncoming emergency physician at night, Dr. Jacinto Reap. Rowe to assume care awaiting for callback from [facility] neurosurgery    Noemi Chapel, MD 12/15/21 [phone]

## 2021-12-14 NOTE — ED Provider Triage Note (Signed)
Emergency Medicine Provider Triage Evaluation Note  Cody Gonzalez , a 76 y.o. male  was evaluated in triage.  Pt complains of patient has recent lumbar surgery on 1/30 at [facility]. Previous lumbar fusion in 2022. Admits to worsening low back pain and left ankle edema x3 days. He was advised by his surgeon to report to UC for x-ray to check hardware and [place] to rule out DVT. Patient seen at [facility] prior to arrival and sent to the ED for further evaluation. Patient hypotensive in triage. Denies nausea, vomiting, diarrhea. No bleeding. Currently taking a lot of pain medication. Wife is at bedside. No fever or chills. Denies chest pain and shortness of breath.   Review of Systems  Positive: Lower extremity edema, low back pain Negative: fever  Physical Exam  BP (!) 82/45    Pulse 75    Temp (!) 97.5 F (36.4 C) (Oral)    Resp 18    SpO2 95%  Gen:   Awake, no distress   Resp:  Normal effort  MSK:   Moves extremities without difficulty  Other:  Edema to left ankle  Medical Decision Making  Medically screening exam initiated at 12:38 PM.  Appropriate orders placed.  Cody Gonzalez was informed that the remainder of the evaluation will be completed by another provider, this initial triage assessment does not replace that evaluation, and the importance of remaining in the ED until their evaluation is complete.  Labs [place] X-ray Informed charge RN, Roselyn Reef, need for room due to hypotension. BP checked numerous times in triage.    Suzy Bouchard, [place] 12/14/21 1240

## 2021-12-14 NOTE — ED Triage Notes (Signed)
Pt had spinal fusion at [facility] on 1/30.  Discharged 1 week ago.  Increased back pain since Wednesday night.  Talked to surgeon yesterday and told to come to ED for x-ray to check hardware.  Also reports L ankle swelling.    Pt hypotensive.  Taking oxycodone, Robaxin, and Gabapentin.

## 2021-12-14 NOTE — ED Provider Notes (Signed)
°  Provider Note MRN:  [id number]  Arrival date & time: 12/15/21    ED Course and Medical Decision Making  Assumed care from Dr. Sabra Heck at shift change.  Worsening back pain after lumbar surgery on January 30, awaiting MRI imaging.  Receiving antibiotics, will touch base again with [facility] after MRI results.  12:10 AM update: Called by radiology with preliminary read of MRI, concerning for epidural abscess extending from T11 down to L4 with some stenosis/possible compression of the cauda equina.  On my exam patient sitting comfortably, he has intact strength and sensation to lower extremities at this time.  We have touched base again with [facility] and are awaiting formal acceptance by the neurosurgeon.  .Critical Care Performed by: Maudie Flakes, MD Authorized by: Maudie Flakes, MD   Critical care provider statement:    Critical care time (minutes):  35   Critical care was necessary to treat or prevent imminent or life-threatening deterioration of the following conditions: Epidural abscess.   Critical care was time spent personally by me on the following activities:  Development of treatment plan with patient or surrogate, discussions with consultants, evaluation of patient's response to treatment, examination of patient, ordering and review of laboratory studies, ordering and review of radiographic studies, ordering and performing treatments and interventions, pulse oximetry, re-evaluation of patient's condition and review of old charts   I assumed direction of critical care for this patient from another provider in my specialty: yes     Care discussed with: accepting provider at another facility    Final Clinical Impressions(s) / ED Diagnoses     ICD-10-CM   1. Acute low back pain without sciatica, unspecified back pain laterality  M54.50     2. Lactic acid acidosis  E87.20     3. Chronic hyponatremia  E87.1       ED Discharge Orders     None       Discharge Instructions   None      Barth Kirks. [place]onia Small, [place] [email or web address]    Maudie Flakes, MD 12/15/21 [phone]

## 2021-12-14 NOTE — ED Notes (Signed)
Pt transported to MRI 

## 2021-12-15 DIAGNOSIS — M545 Low back pain, unspecified: Secondary | ICD-10-CM | POA: Diagnosis not present

## 2021-12-15 NOTE — ED Notes (Signed)
[facility] transport called

## 2021-12-15 NOTE — ED Notes (Signed)
Report given to Dahlia Bailiff RN charge nurse at [facility] on patient's transfer . Secretary notified [facility] for transport.

## 2021-12-15 NOTE — ED Notes (Signed)
Patient's spouse signed consent form to transfer patient to [facility] .

## 2021-12-19 LAB — CULTURE, BLOOD (ROUTINE X 2)
Culture: NO GROWTH
Culture: NO GROWTH
Special Requests: ADEQUATE
Special Requests: ADEQUATE

## 2022-01-03 ENCOUNTER — Observation Stay (HOSPITAL_COMMUNITY): Payer: Medicare Other

## 2022-01-03 ENCOUNTER — Emergency Department (HOSPITAL_COMMUNITY): Payer: Medicare Other

## 2022-01-03 ENCOUNTER — Other Ambulatory Visit: Payer: Self-pay

## 2022-01-03 ENCOUNTER — Inpatient Hospital Stay (HOSPITAL_COMMUNITY)
Admission: EM | Admit: 2022-01-03 | Discharge: 2022-01-05 | DRG: 312 | Disposition: A | Discharge status: Home Health | Payer: Medicare Other | Attending: Internal Medicine | Admitting: Internal Medicine

## 2022-01-03 ENCOUNTER — Encounter (HOSPITAL_COMMUNITY): Payer: Self-pay | Admitting: Internal Medicine

## 2022-01-03 DIAGNOSIS — Z8546 Personal history of malignant neoplasm of prostate: Secondary | ICD-10-CM

## 2022-01-03 DIAGNOSIS — D649 Anemia, unspecified: Secondary | ICD-10-CM | POA: Diagnosis present

## 2022-01-03 DIAGNOSIS — I951 Orthostatic hypotension: Secondary | ICD-10-CM | POA: Diagnosis not present

## 2022-01-03 DIAGNOSIS — Z79899 Other long term (current) drug therapy: Secondary | ICD-10-CM

## 2022-01-03 DIAGNOSIS — R55 Syncope and collapse: Secondary | ICD-10-CM | POA: Diagnosis not present

## 2022-01-03 DIAGNOSIS — G4730 Sleep apnea, unspecified: Secondary | ICD-10-CM | POA: Diagnosis present

## 2022-01-03 DIAGNOSIS — I10 Essential (primary) hypertension: Secondary | ICD-10-CM | POA: Diagnosis present

## 2022-01-03 DIAGNOSIS — E871 Hypo-osmolality and hyponatremia: Secondary | ICD-10-CM | POA: Diagnosis not present

## 2022-01-03 DIAGNOSIS — Z87820 Personal history of traumatic brain injury: Secondary | ICD-10-CM

## 2022-01-03 DIAGNOSIS — Z981 Arthrodesis status: Secondary | ICD-10-CM

## 2022-01-03 DIAGNOSIS — G062 Extradural and subdural abscess, unspecified: Secondary | ICD-10-CM | POA: Diagnosis not present

## 2022-01-03 DIAGNOSIS — Z808 Family history of malignant neoplasm of other organs or systems: Secondary | ICD-10-CM

## 2022-01-03 DIAGNOSIS — Z20822 Contact with and (suspected) exposure to covid-19: Secondary | ICD-10-CM | POA: Diagnosis present

## 2022-01-03 DIAGNOSIS — D6489 Other specified anemias: Secondary | ICD-10-CM | POA: Diagnosis present

## 2022-01-03 LAB — BASIC METABOLIC PANEL
Anion gap: 12 (ref 5–15)
BUN: 11 mg/dL (ref 8–23)
CO2: 23 mmol/L (ref 22–32)
Calcium: 8.6 mg/dL — ABNORMAL LOW (ref 8.9–10.3)
Chloride: 92 mmol/L — ABNORMAL LOW (ref 98–111)
Creatinine, Ser: 0.79 mg/dL (ref 0.61–1.24)
GFR, Estimated: >60 mL/min (ref >60)
Glucose, Bld: 129 mg/dL — ABNORMAL HIGH (ref 70–99)
Potassium: 4.2 mmol/L (ref 3.5–5.1)
Sodium: 127 mmol/L — ABNORMAL LOW (ref 135–145)

## 2022-01-03 LAB — TROPONIN I (HIGH SENSITIVITY)
Troponin I (High Sensitivity): 12 ng/L (ref <18)
Troponin I (High Sensitivity): 11 ng/L (ref <18)

## 2022-01-03 LAB — CBC WITH DIFFERENTIAL/PLATELET
Abs Immature Granulocytes: 0.02 10*3/uL (ref 0.00–0.07)
Basophils Absolute: 0 10*3/uL (ref 0.0–0.1)
Basophils Relative: 0 %
Eosinophils Absolute: 0.1 10*3/uL (ref 0.0–0.5)
Eosinophils Relative: 1 %
HCT: 27 % — ABNORMAL LOW (ref 39.0–52.0)
Hemoglobin: 9.2 g/dL — ABNORMAL LOW (ref 13.0–17.0)
Immature Granulocytes: 0 %
Lymphocytes Relative: 4 %
Lymphs Abs: 0.2 10*3/uL — ABNORMAL LOW (ref 0.7–4.0)
MCH: 32.7 pg (ref 26.0–34.0)
MCHC: 34.1 g/dL (ref 30.0–36.0)
MCV: 96.1 fL (ref 80.0–100.0)
Monocytes Absolute: 0.5 10*3/uL (ref 0.1–1.0)
Monocytes Relative: 10 %
Neutro Abs: 4.1 10*3/uL (ref 1.7–7.7)
Neutrophils Relative %: 85 %
Platelets: 284 10*3/uL (ref 150–400)
RBC: 2.81 MIL/uL — ABNORMAL LOW (ref 4.22–5.81)
RDW: 13.1 % (ref 11.5–15.5)
WBC: 4.8 10*3/uL (ref 4.0–10.5)
nRBC: 0 % (ref 0.0–0.2)

## 2022-01-03 LAB — RESP PANEL BY RT-PCR (FLU A&B, COVID) ARPGX2
Influenza A by PCR: NEGATIVE
Influenza B by PCR: NEGATIVE
SARS Coronavirus 2 by RT PCR: NEGATIVE

## 2022-01-03 LAB — CBG MONITORING, ED: Glucose-Capillary: 128 mg/dL — ABNORMAL HIGH (ref 70–99)

## 2022-01-03 IMAGING — DX DG CHEST 1V PORT
1 series · 1 of 1 positions shown · non-contrast
Comparison: [DATE]

CLINICAL DATA: Near syncope

EXAM:
PORTABLE CHEST 1 VIEW

[chest]
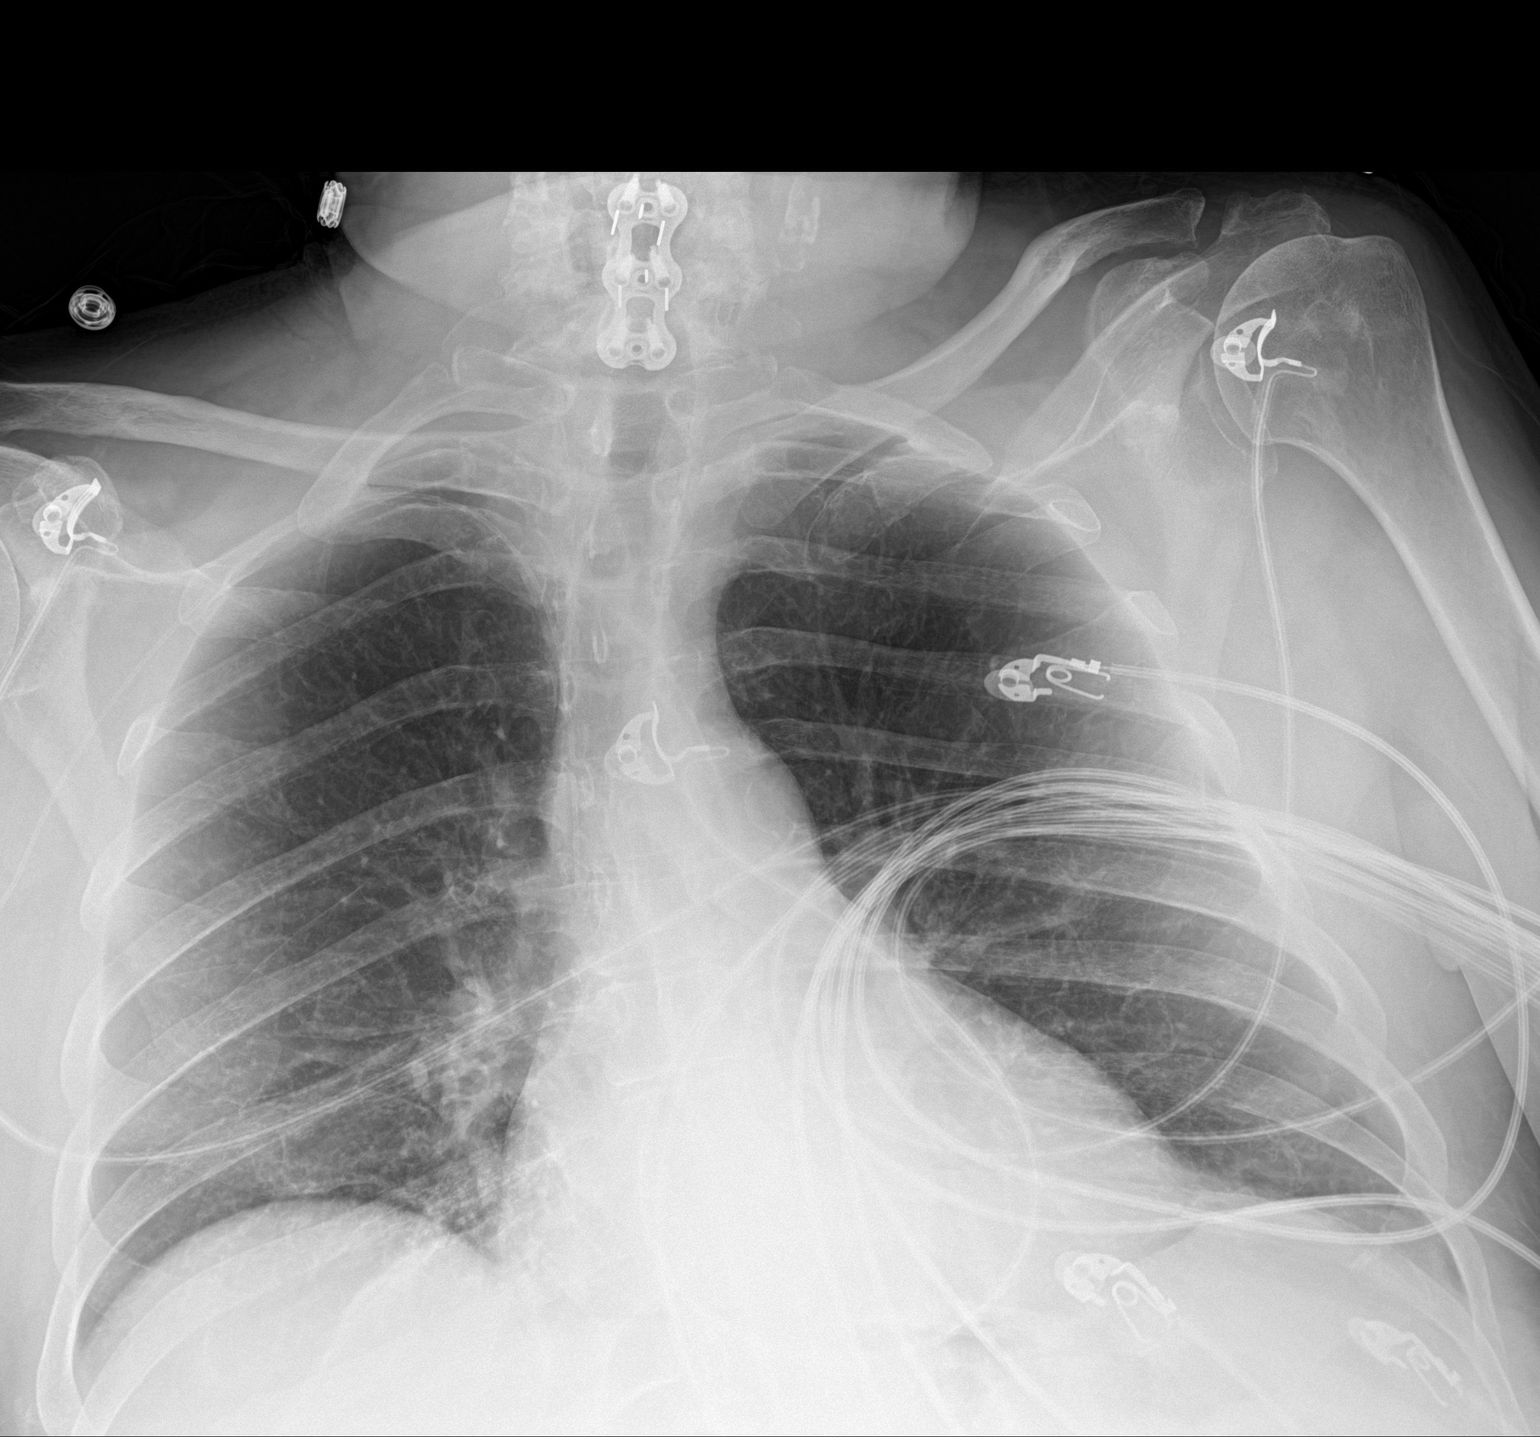

[1 of 1 positions shown; findings below may reference images not displayed]

FINDINGS: Right-sided PICC line with the tip projecting over the SVC. No focal
consolidation. No pleural effusion or pneumothorax. Heart and
mediastinal contours are unremarkable.

No acute osseous abnormality.
IMPRESSION: No active disease.

## 2022-01-03 IMAGING — CT CT HEAD W/O CM
3 series · 15 of 47 positions shown, 18 images · non-contrast
Comparison: None.

CLINICAL DATA: Altered mental status



[Series 3: head 5.0 h30s · axial · 0.46mm/px · z∈[-107,+43]mm · 9 of 36 slices shown, 12 images]
[im 3/36  brain]
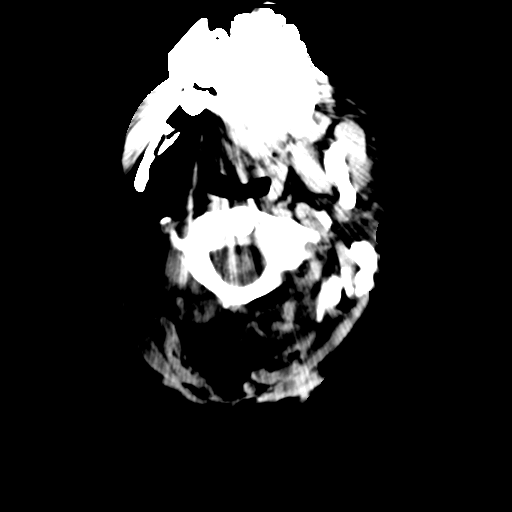
[im 3/36  bone]
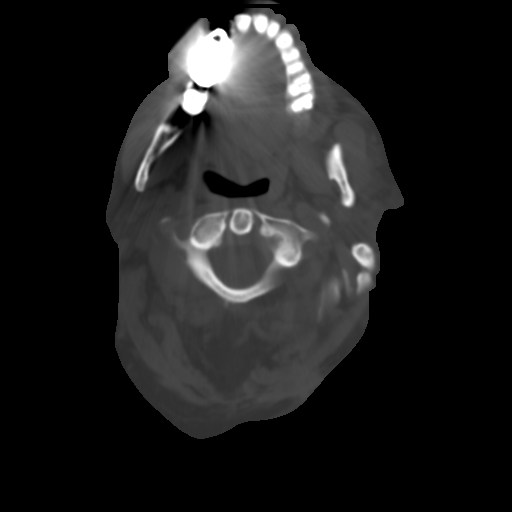
[im 7/36  brain]
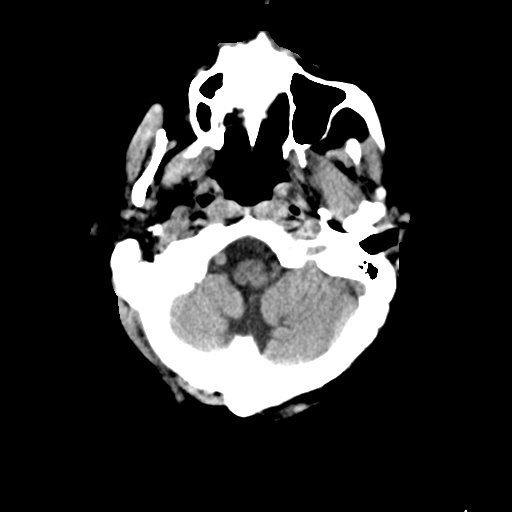
[im 10/36  brain]
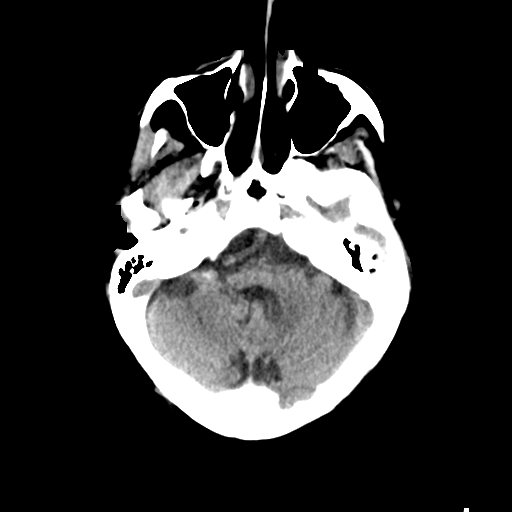
[im 14/36  brain]
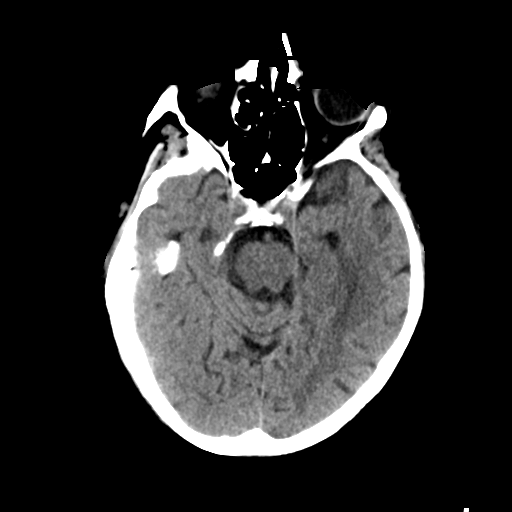
[im 19/36  brain]
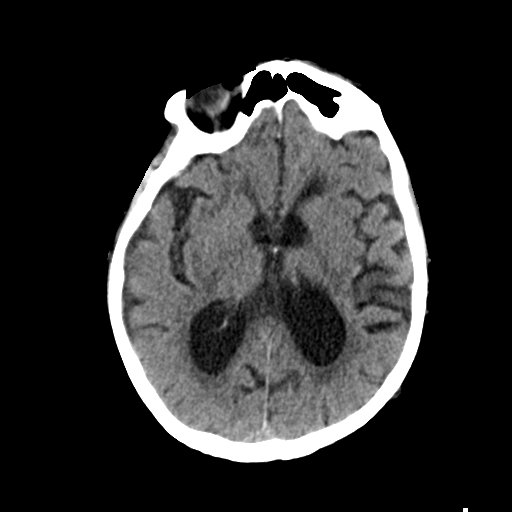
[im 19/36  bone]
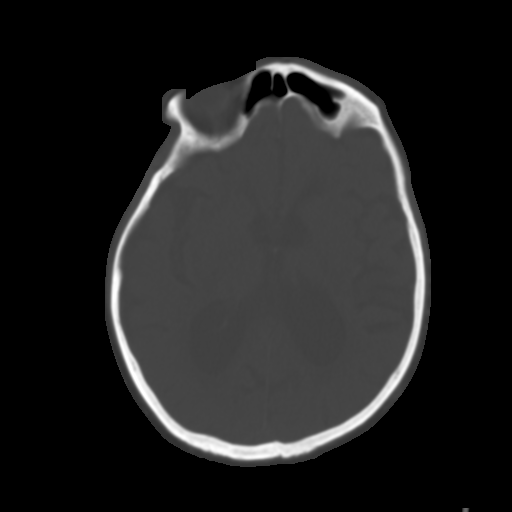
[im 22/36  brain]
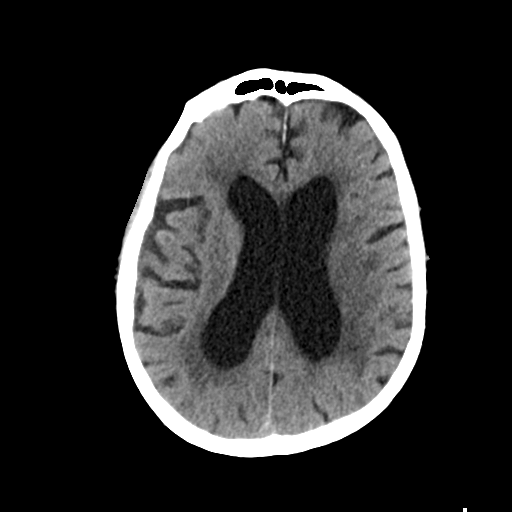
[im 26/36  brain]
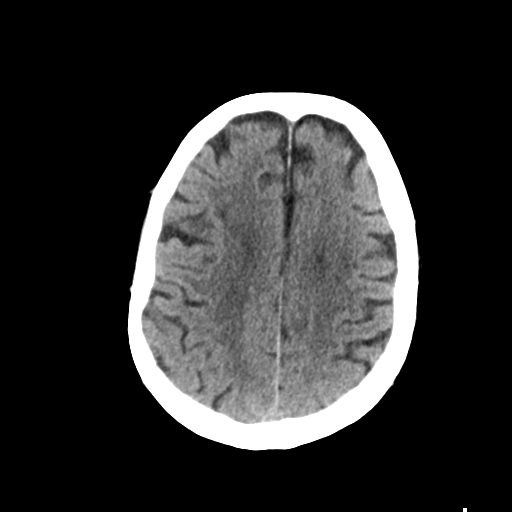
[im 29/36  brain]
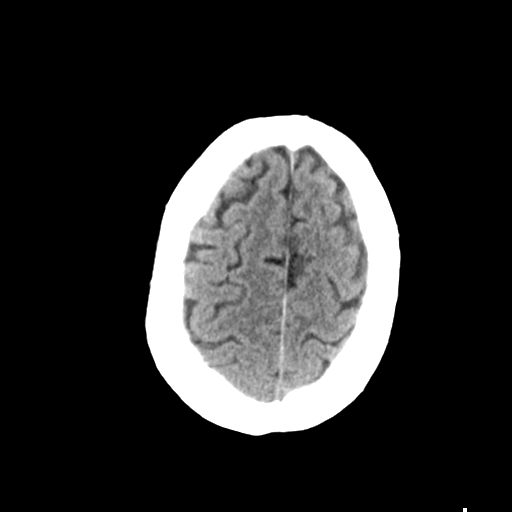
[im 33/36  brain]
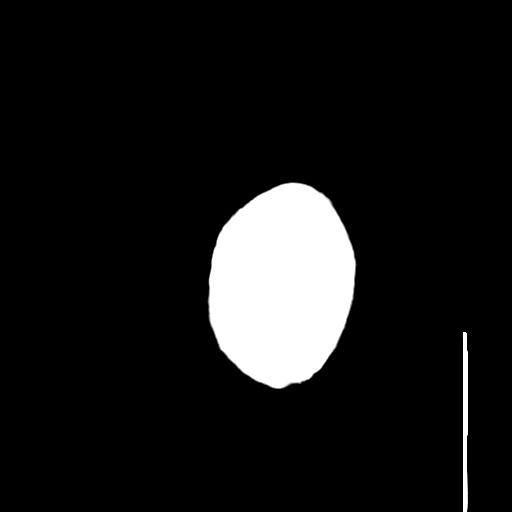
[im 33/36  bone]
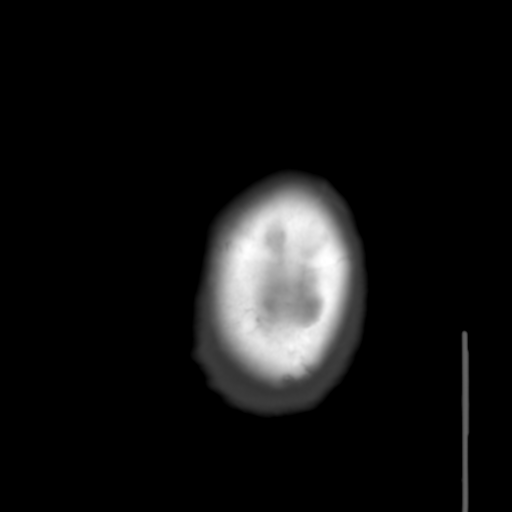

[Series 5: head 3.0 mpr cor · coronal · 0.34mm/px · 3 of 69 slices shown]
[im 23/69  brain]
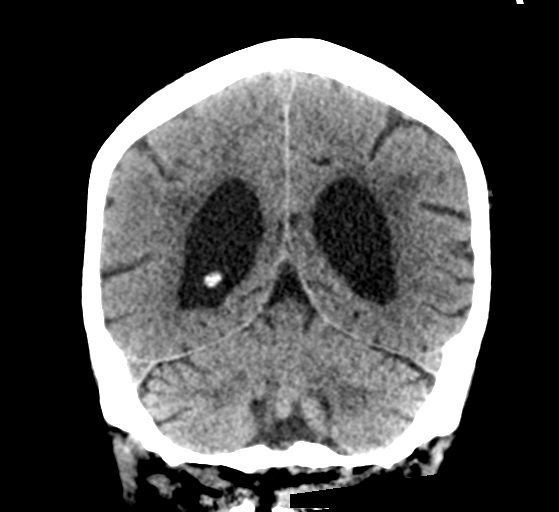
[im 31/69  brain]
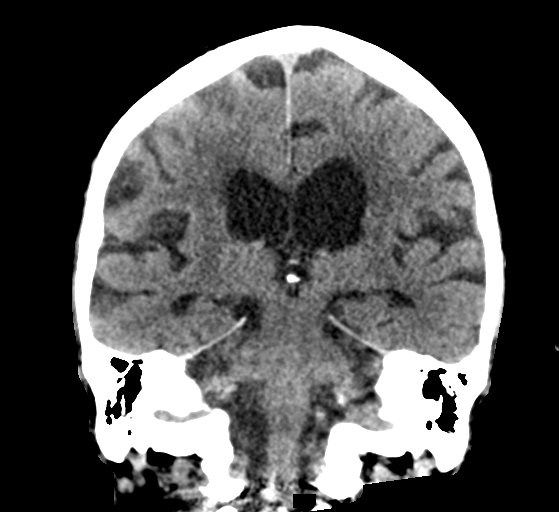
[im 38/69  brain]
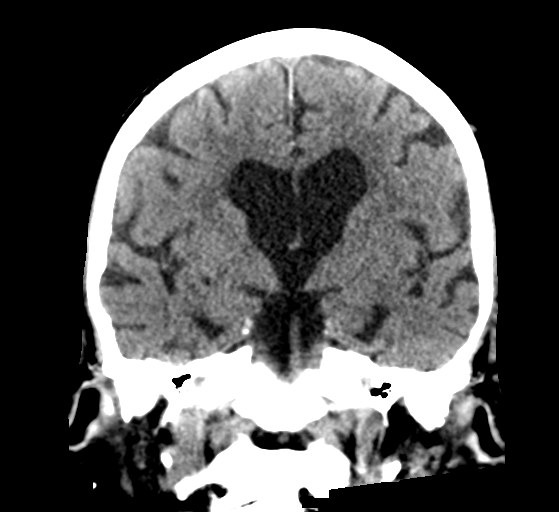

[Series 6: head 3.0 mpr sag · sagittal · 0.34mm/px · 3 of 67 slices shown]
[im 26/67  brain]
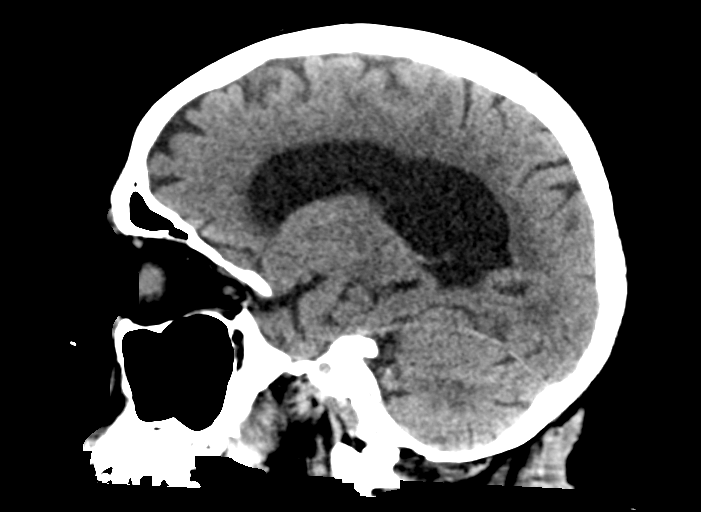
[im 34/67  brain]
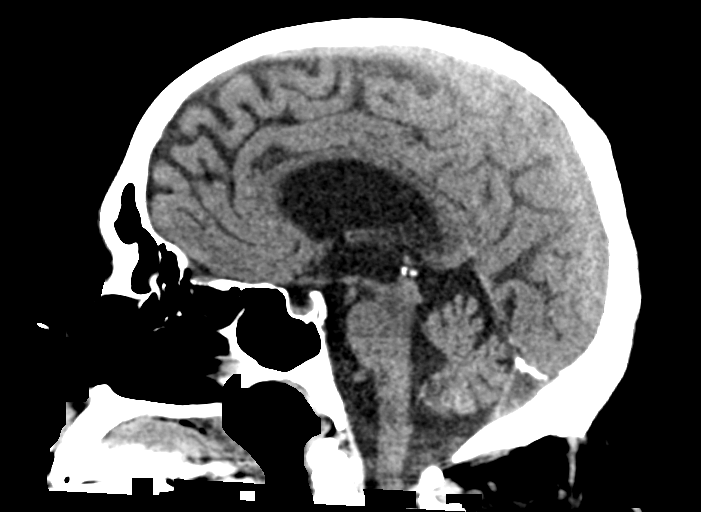
[im 41/67  brain]
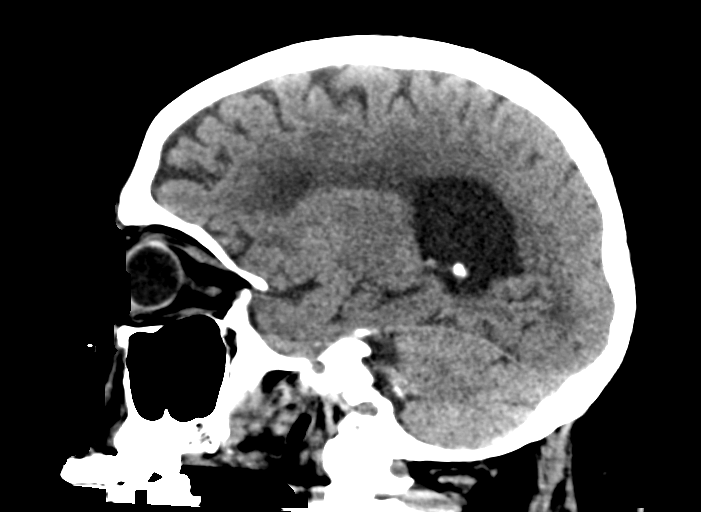

[15 of 47 positions shown; findings below may reference images not displayed]

FINDINGS: Brain: No acute intracranial hemorrhage, mass effect, or herniation.
No extra-axial fluid collections. No evidence of acute territorial
infarct. No hydrocephalus. Mild cortical volume loss. Patchy
hypodensities in the periventricular and subcortical white matter,
likely secondary to chronic microvascular ischemic changes.

Vascular: No hyperdense vessel or unexpected calcification.

Skull: Normal. Negative for fracture or focal lesion.

Sinuses/Orbits: No acute finding.

Other: None.
IMPRESSION: Chronic changes with no acute intracranial process identified.

## 2022-01-03 MED ORDER — ENOXAPARIN SODIUM 40 MG/0.4ML IJ SOSY
40.0000 mg | PREFILLED_SYRINGE | INTRAMUSCULAR | Status: DC
  Administered 2022-01-03 – 2022-01-04 (×2): 40 mg via SUBCUTANEOUS
  Filled 2022-01-03 (×2): qty 0.4

## 2022-01-03 MED ORDER — ACETAMINOPHEN 325 MG PO TABS
650.0000 mg | ORAL_TABLET | Freq: Four times a day (QID) | ORAL | Status: DC | PRN
  Administered 2022-01-03: 650 mg via ORAL
  Filled 2022-01-03: qty 2

## 2022-01-03 MED ORDER — SODIUM CHLORIDE 0.9 % IV SOLN
2.0000 g | INTRAVENOUS | Status: DC
  Administered 2022-01-03 – 2022-01-05 (×3): 2 g via INTRAVENOUS
  Filled 2022-01-03 (×3): qty 20

## 2022-01-03 MED ORDER — ALBUTEROL SULFATE (2.5 MG/3ML) 0.083% IN NEBU: 2.5000 mg | INHALATION_SOLUTION | Freq: Four times a day (QID) | RESPIRATORY_TRACT | Status: DC | PRN

## 2022-01-03 MED ORDER — SODIUM CHLORIDE 0.9 % IV SOLN
INTRAVENOUS | Status: DC
Start: 2022-01-03 — End: 2022-01-03

## 2022-01-03 MED ORDER — CHLORHEXIDINE GLUCONATE CLOTH 2 % EX PADS
6.0000 | MEDICATED_PAD | Freq: Every day | CUTANEOUS | Status: DC
  Administered 2022-01-04 – 2022-01-05 (×2): 6 via TOPICAL

## 2022-01-03 MED ORDER — SODIUM CHLORIDE 0.9 % IV BOLUS
500.0000 mL | Freq: Once | INTRAVENOUS | Status: AC
  Administered 2022-01-03: 500 mL via INTRAVENOUS

## 2022-01-03 MED ORDER — ONDANSETRON HCL 4 MG PO TABS: 4.0000 mg | ORAL_TABLET | Freq: Four times a day (QID) | ORAL | Status: DC | PRN

## 2022-01-03 MED ORDER — SODIUM CHLORIDE 0.9% FLUSH: 10.0000 mL | INTRAVENOUS | Status: DC | PRN

## 2022-01-03 MED ORDER — LIDOCAINE 5 % EX PTCH
2.0000 | MEDICATED_PATCH | Freq: Every day | CUTANEOUS | Status: DC
  Administered 2022-01-04 – 2022-01-05 (×2): 2 via TRANSDERMAL
  Filled 2022-01-03 (×2): qty 2

## 2022-01-03 MED ORDER — GABAPENTIN 300 MG PO CAPS
300.0000 mg | ORAL_CAPSULE | Freq: Three times a day (TID) | ORAL | Status: DC
  Administered 2022-01-03 – 2022-01-05 (×7): 300 mg via ORAL
  Filled 2022-01-03 (×7): qty 1

## 2022-01-03 MED ORDER — SODIUM CHLORIDE 0.9% FLUSH
10.0000 mL | Freq: Two times a day (BID) | INTRAVENOUS | Status: DC
  Administered 2022-01-04 – 2022-01-05 (×2): 10 mL

## 2022-01-03 MED ORDER — CALCIUM GLUCONATE-NACL 1-0.675 GM/50ML-% IV SOLN
1.0000 g | Freq: Once | INTRAVENOUS | Status: AC
  Administered 2022-01-03: 1000 mg via INTRAVENOUS
  Filled 2022-01-03: qty 50

## 2022-01-03 MED ORDER — ONDANSETRON HCL 4 MG/2ML IJ SOLN: 4.0000 mg | Freq: Four times a day (QID) | INTRAMUSCULAR | Status: DC | PRN

## 2022-01-03 MED ORDER — SODIUM CHLORIDE 0.9% FLUSH
3.0000 mL | Freq: Two times a day (BID) | INTRAVENOUS | Status: DC
  Administered 2022-01-04 – 2022-01-05 (×2): 3 mL via INTRAVENOUS

## 2022-01-03 MED ORDER — ATORVASTATIN CALCIUM 40 MG PO TABS
40.0000 mg | ORAL_TABLET | Freq: Every day | ORAL | Status: DC
Start: 2022-01-03 — End: 2022-01-05
  Administered 2022-01-03 – 2022-01-04 (×2): 40 mg via ORAL
  Filled 2022-01-03 (×2): qty 1

## 2022-01-03 MED ORDER — ACETAMINOPHEN 650 MG RE SUPP: 650.0000 mg | Freq: Four times a day (QID) | RECTAL | Status: DC | PRN

## 2022-01-03 MED ORDER — NIFEDIPINE ER OSMOTIC RELEASE 60 MG PO TB24
60.0000 mg | ORAL_TABLET | Freq: Every day | ORAL | Status: DC
  Filled 2022-01-03: qty 1

## 2022-01-03 MED ORDER — VANCOMYCIN HCL 1500 MG/300ML IV SOLN
1500.0000 mg | INTRAVENOUS | Status: DC
Start: 2022-01-03 — End: 2022-01-05
  Administered 2022-01-03 – 2022-01-05 (×3): 1500 mg via INTRAVENOUS
  Filled 2022-01-03 (×3): qty 300

## 2022-01-03 MED ORDER — SODIUM CHLORIDE 0.9 % IV SOLN: INTRAVENOUS | Status: DC

## 2022-01-03 MED ORDER — OXYCODONE HCL 5 MG PO TABS
5.0000 mg | ORAL_TABLET | ORAL | Status: DC | PRN
  Administered 2022-01-03 – 2022-01-05 (×4): 5 mg via ORAL
  Filled 2022-01-03 (×4): qty 1

## 2022-01-03 NOTE — Assessment & Plan Note (Addendum)
Patient presents after having episode of syncope today.  His wife notes that he just finished eating breakfast and started staring off.  She checked his blood pressures and noted that it was 60/30 and called EMS.  Records note this is happened least 2 other times 1 of which was during his last hospitalization for which cardiology had suspected symptoms worse secondary to a vasovagal event.  Orthostatic vital signs were noted to be positive.  High-sensitivity troponins were negative x2.  Patient has been bolused IV fluids while in the ED. Home blood pressure medications include nifedipine 60 mg daily and Micardis 80 mg daily. ?-Admit to medical telemetry bed ?-Abdominal binder and TED hose ?-Hold possible precipitators of orthostatic hypotension including Robaxin more so than Flomax ?-Continued nifedipine ?-Recheck orthostatic vital signs in a.m. ?-PT to eval and treat ?-Question need of symptomatic ?-Follow-up telemetry overnight ? ? ?

## 2022-01-03 NOTE — ED Provider Notes (Signed)
[place] EMERGENCY DEPARTMENT Provider Note   CSN: [id number] Arrival date & time: 01/03/22  1014     History  Chief Complaint  Patient presents with   Near Syncope    Cody Gonzalez is a 76 y.o. male.   Near Syncope  Patient presents to the ED for evaluation after a syncopal episode.  Patient has history of anemia, hypertension, prostate cancer and sleep apnea.  Patient also recently was admitted to the hospital at [facility] on February 12 for an epidural abscess.  Records from that hospitalization reviewed.  Patient was discharged home on IV antibiotics and is continuing them at home with a PICC line.  Patient was discharged on February 20.  Since that time patient has had an issue with a syncopal episode.  This occurred on February 21.  They called EMS and were evaluated.  They spoke with the doctor on call at [facility] and recommended continuing monitoring to go to the ED if the symptoms return.  Patient had a syncopal episode this morning.  He was eating breakfast.  With warning he had a syncopal event.  Patient denies having any trouble with fevers or chills.  He has not had any vomiting or diarrhea.  He denies any acute weakness.  EMS was contacted.  They noted his blood pressure was low in the 99M systolic.  He was given IV fluids.  Patient denies any chest pain or shortness of breath.  No vomiting or diarrhea.  Patient states he mostly feels tired now and wants to sleep.   Additional information obtained from the wife who is currently at the bedside.  She states that while the patient was eating breakfast he suddenly slumped over and became unresponsive.  He looked very pale.  She took his blood pressure and noted to be in the 60s.  He had not had any complaints prior to the event.  No seizure episodes noted  Home Medications Prior to Admission medications   Medication Sig Start Date End Date Taking? Authorizing Provider  Acetaminophen 500 MG capsule Take 1,000  mg by mouth in the morning, at noon, and at bedtime.   Yes [provider]  atorvastatin (LIPITOR) 40 MG tablet Take 40 mg by mouth at bedtime. 09/01/19  Yes [provider]  b complex vitamins capsule Take 1 capsule by mouth daily.   Yes [provider]  CALCIUM PO Take 1,200 tablets by mouth daily.   Yes [provider]  ferrous sulfate 325 (65 FE) MG tablet Take 325 mg by mouth at bedtime.   Yes [provider]  gabapentin (NEURONTIN) 300 MG capsule Take 300 mg by mouth 3 (three) times daily.   Yes [provider]  Lidocaine 4 % PTCH Apply 2 patches topically daily.   Yes [provider]  methocarbamol (ROBAXIN) 750 MG tablet Take 750 mg by mouth 3 (three) times daily.   Yes [provider]  Multiple Vitamins-Iron (MULTIVITAMIN/IRON PO) Take 1 tablet by mouth daily with breakfast.   Yes [provider]  NIFEdipine (ADALAT CC) 60 MG 24 hr tablet Take 60 mg by mouth in the morning. 11/05/21  Yes [provider]  oxyCODONE (OXY IR/ROXICODONE) 5 MG immediate release tablet Take 5-10 mg by mouth every 4 (four) hours as needed (for pain).   Yes [provider]  tamsulosin (FLOMAX) 0.4 MG CAPS capsule TAKE 1 CAPSULE DAILY AFTER SUPPER. FOR UINARY URGENCY/FREQUENCY AFTERPROSTRATE RADIATION. Patient taking differently: Take 0.4 mg by  mouth at bedtime. 11/27/19  Yes Tyler Pita, MD  telmisartan (MICARDIS) 80 MG tablet Take 80 mg by mouth daily. 02/25/21  Yes [provider]  vitamin C (ASCORBIC ACID) 500 MG tablet Take 500 mg by mouth daily.   Yes [provider]  zinc gluconate 50 MG tablet Take 50 mg by mouth daily. X 14 days, will end on 01-06-22   Yes [provider]  COVID-19 mRNA bivalent vaccine, Pfizer, (PFIZER COVID-19 VAC BIVALENT) injection Inject into the muscle. Patient not taking: Reported on 12/14/2021 07/26/21   Carlyle Basques, MD      Allergies    Amlodipine  besylate    Review of Systems   Review of Systems  Constitutional:  Negative for fever.  Cardiovascular:  Positive for near-syncope.   Physical Exam Updated Vital Signs BP 127/72    Pulse 79    Temp 98 F (36.7 C) (Oral)    Resp 15    Ht 1.702 m (5\' 7" )    Wt 72.6 kg    SpO2 97%    BMI 25.06 kg/m  Physical Exam Vitals and nursing note reviewed.  Constitutional:      Appearance: He is well-developed. He is not diaphoretic.  HENT:     Head: Normocephalic and atraumatic.     Right Ear: External ear normal.     Left Ear: External ear normal.  Eyes:     General: No scleral icterus.       Right eye: No discharge.        Left eye: No discharge.     Conjunctiva/sclera: Conjunctivae normal.  Neck:     Trachea: No tracheal deviation.  Cardiovascular:     Rate and Rhythm: Normal rate and regular rhythm.  Pulmonary:     Effort: Pulmonary effort is normal. No respiratory distress.     Breath sounds: Normal breath sounds. No stridor. No wheezing or rales.  Abdominal:     General: Bowel sounds are normal. There is no distension.     Palpations: Abdomen is soft.     Tenderness: There is no abdominal tenderness. There is no guarding or rebound.  Musculoskeletal:        General: No tenderness.     Cervical back: Neck supple.  Skin:    General: Skin is warm and dry.     Findings: No rash.  Neurological:     Mental Status: He is alert and oriented to person, place, and time.     Cranial Nerves: No cranial nerve deficit.     Sensory: No sensory deficit.     Motor: No abnormal muscle tone or seizure activity.     Coordination: Coordination normal.     Comments: No pronator drift bilateral upper extrem, able to hold both legs off bed for 5 seconds, sensation intact in all extremities, no visual field cuts, no left or right sided neglect, normal finger-nose exam bilaterally, no nystagmus noted  No facial droop, extraocular movements intact, tongue midline    ED Results / Procedures /  Treatments   Labs (all labs ordered are listed, but only abnormal results are displayed) Labs Reviewed  BASIC METABOLIC PANEL - Abnormal; Notable for the following components:      Result Value   Sodium 127 (*)    Chloride 92 (*)    Glucose, Bld 129 (*)    Calcium 8.6 (*)    All other components within normal limits  CBC WITH DIFFERENTIAL/PLATELET - Abnormal; Notable for the following  components:   RBC 2.81 (*)    Hemoglobin 9.2 (*)    HCT 27.0 (*)    Lymphs Abs 0.2 (*)    All other components within normal limits  CBG MONITORING, ED - Abnormal; Notable for the following components:   Glucose-Capillary 128 (*)    All other components within normal limits  RESP PANEL BY RT-PCR (FLU A&B, COVID) ARPGX2  TROPONIN I (HIGH SENSITIVITY)  TROPONIN I (HIGH SENSITIVITY)    EKG EKG Interpretation  Date/Time:  Friday January 03 2022 10:12:37 EST Ventricular Rate:  73 PR Interval:  201 QRS Duration: 112 QT Interval:  390 QTC Calculation: 430 R Axis:   66 Text Interpretation: Sinus rhythm Borderline intraventricular conduction delay Borderline T wave abnormalities , new since last tracing Confirmed by Dorie Rank [phone]) on 01/03/2022 10:32:36 AM  Radiology CT Head Wo Contrast  Result Date: 01/03/2022 CLINICAL DATA:  Altered mental status EXAM: CT HEAD WITHOUT CONTRAST TECHNIQUE: Contiguous axial images were obtained from the base of the skull through the vertex without intravenous contrast. RADIATION DOSE REDUCTION: This exam was performed according to the departmental dose-optimization program which includes automated exposure control, adjustment of the mA and/or kV according to patient size and/or use of iterative reconstruction technique. COMPARISON:  None. FINDINGS: Brain: No acute intracranial hemorrhage, mass effect, or herniation. No extra-axial fluid collections. No evidence of acute territorial infarct. No hydrocephalus. Mild cortical volume loss. Patchy hypodensities in the  periventricular and subcortical white matter, likely secondary to chronic microvascular ischemic changes. Vascular: No hyperdense vessel or unexpected calcification. Skull: Normal. Negative for fracture or focal lesion. Sinuses/Orbits: No acute finding. Other: None. IMPRESSION: Chronic changes with no acute intracranial process identified. Electronically Signed   By: Ofilia Neas M.D.   On: 01/03/2022 11:04    Procedures Procedures    Medications Ordered in ED Medications  sodium chloride 0.9 % bolus 500 mL (0 mLs Intravenous Stopped 01/03/22 1149)    And  0.9 %  sodium chloride infusion ( Intravenous New Bag/Given 01/03/22 1157)  vancomycin (VANCOREADY) IVPB 1500 mg/300 mL (1,500 mg Intravenous New Bag/Given 01/03/22 1244)  cefTRIAXone (ROCEPHIN) 2 g in sodium chloride 0.9 % 100 mL IVPB (has no administration in time range)  sodium chloride 0.9 % bolus 500 mL (has no administration in time range)    ED Course/ Medical Decision Making/ A&P Clinical Course as of 01/03/22 1301  Fri Jan 03, 2022  1131 Wife would like to administer his home antibiotic regimen that he supposed to get.  Per hospital policy pharmacy will be involved [JK]  1131 CBC WITH DIFFERENTIAL(!) Hemoglobin decreased but unchanged from a couple weeks ago [JK]  1232 Resp Panel by RT-PCR (Flu A&B, Covid) Nasopharyngeal Swab COVID and flu negative [JK]  1232 CT Head Wo Contrast Head CT images and radiology report reviewed.  No acute findings [JK]  1300 Case discussed with Dr Tamala Julian regarding admission [JK]    Clinical Course User Index [JK] Dorie Rank, MD                           Medical Decision Making Amount and/or Complexity of Data Reviewed Labs: ordered. Decision-making details documented in ED Course. Radiology: ordered. Decision-making details documented in ED Course.  Risk Prescription drug management.   Syncope Patient has now had 2 syncopal episodes in the last week or 2.  No clear etiology for this.   Patient's vital signs here are normal.  No dysrhythmia  noted on EKG and monitors.  However concerned about the recurrent nature of his syncopal episodes.  With his age and comorbidities I think he would benefit for hospitalization and further evaluation, possible echocardiogram.  History of spinal surgery and infection Patient was recently admitted to [facility] for increasing back pain after recent spinal surgery.  Patient ended up being diagnosed with an epidural abscess.  Patient does not show any signs of recurrent infection.   No signs of fever or leukocytosis.  He is being maintained on IV antibiotics.         Final Clinical Impression(s) / ED Diagnoses Final diagnoses:  Syncope and collapse     Dorie Rank, MD 01/03/22 1301

## 2022-01-03 NOTE — Plan of Care (Signed)
Patient is alert and oriented X4, transferred to to the unit from ED accompanied by transporter and patient's wife. Denies any chest pain, abdominal pain, or SOB currently but reports mild pain to surgical site (back pain) from recent spinal fusion surgery. Skin warm and dry, call bed within reach. No respiratory discomfort noted. ?

## 2022-01-03 NOTE — Assessment & Plan Note (Addendum)
Hospitalized at [facility] with an epidural abscess last month for which 1 of 4 cultures grew out 1 Cutibacterium acne.  ID recommended patient be on vancomycin and Rocephin for total of 6 weeks with PICC line initially placed on 2/16. ?-Continue vancomycin and Rocephin per pharmacy ?-Continue oxycodone as needed for pain ?

## 2022-01-03 NOTE — Progress Notes (Signed)
Pharmacy Antibiotic Note ? ?Cody Gonzalez is a 76 y.o. male admitted on 01/03/2022 with  bone/joint infection  on vancomycin and ceftriaxone therapy at home.  Pharmacy has been consulted to continue ceftriaxone and vancomycin dosing. Last doses of both antibiotics was yesterday.  ? ?Current outpatient antibiotic regimen: ?Ceftriaxone 2 g IV every 24 hours ?Vancomycin IV 1.5 gm IV Q 24 hours (goal AUC/MIC 400-600)  ?End of Therapy: 01/27/22 ? ?Plan: ?-Resume ceftriaxone 2 gm IV Q 24 hours ?-Resume vancomycin 1500 mg IV Q 24 hours for goal AUC/MIC of 400-600 per outpatient goals ?-Monitor vancomycin levels while admitted ?-Monitor clinical progress ? ?Height: 5\' 7"  (170.2 cm) ?Weight: 72.6 kg (160 lb) ?IBW/kg (Calculated) : 66.1 ? ?Temp (24hrs), Avg:98 ?F (36.7 ?C), Min:98 ?F (36.7 ?C), Max:98 ?F (36.7 ?C) ? ?Recent Labs  ?Lab 01/03/22 ?1107  ?WBC 4.8  ?  ?Estimated Creatinine Clearance: 50.2 mL/min (by C-G formula based on SCr of 1.17 mg/dL).   ? ?Allergies  ?Allergen Reactions  ? Amlodipine Besylate Swelling and Other (See Comments)  ?  Ankles swell  ? ? ? ? ?Thank you for allowing pharmacy to be a part of this patient?s care. ? ?Albertina Parr, PharmD., BCCCP ?Clinical Pharmacist ?Please refer to AMION for unit-specific pharmacist  ? ? ?

## 2022-01-03 NOTE — H&P (Signed)
History and Physical    Patient: Cody Gonzalez ZOX:[id number] DOB: 30-Mar-1946 DOA: 01/03/2022 DOS: the patient was seen and examined on 01/03/2022 PCP: [name], FNP  Patient coming from: Home via EMS  Chief Complaint:  Chief Complaint  Patient presents with    Near Syncope    HPI: Cody Gonzalez is a 76 y.o. male with medical history significant of hypertension, anemia, chronic hyponatremia, and prostate cancer who presents after having a near syncopal episode this morning.  His wife is present at bedside and gives additional history.  Patient had been shaking prefaced this morning and was about to eat an orange when his wife noticed that he was pale in color and staring off.  She had gone to check his blood pressure and noted that it was 60/30 for which she immediately called 911.  She states that he never lost consciousness, but did not appear well.  He had been eating and drinking like normal.  He underwent lumbar spinal fusion on 1/30 at [facility].  Required readmission into their hospital after being found to have epidural abscess requiring subsequent washout currently receiving 6 weeks of IV antibiotics of vancomycin and Rocephin after 1 of 4 cultures grew Cutibacterium acne.  During this hospitalization patient had episode of syncope on 2/17 for which she was evaluated by cardiology and symptoms were thought secondary to vasovagal episode.  His wife reports another episode following his hospitalization but the patient stated the pain was grunting and lost consciousness which she called EMS.  She states that he has been eating and drinking okay at home.  He denies any complaints of chest pain or palpitations.  He has been taking muscle relaxants of Robaxin since January.  Upon admission into the emergency department patient was seen to be afebrile with positive orthostatic vital signs.  Blood pressure lying 123/58 with pulse 72, blood pressure sitting 101/58 with pulse  90, and blood pressure 90/52 standing with heart rate 95.  CT scan of the head showed chronic changes without any acute intercranial abnormality. Labs significant for 2 hemoglobin 9.2, sodium 127, BUN 11, creatinine 0.79, and high-sensitivity troponin 12.  Fluids and COVID-19 screening were negative.  Review of Systems: As mentioned in the history of present illness. All other systems reviewed and are negative. Past Medical History:  Diagnosis Date   Anemia    Hypertension    Prostate cancer ([place])    Sleep apnea    uses cpap    TBI (traumatic brain injury) 2015   Past Surgical History:  Procedure Laterality Date   BACK SURGERY     BILATERAL CARPAL TUNNEL RELEASE  2014   CYSTOSCOPY N/A 06/29/2019   Procedure: CYSTOSCOPY;  Surgeon: Alexis Frock, MD;  Location: [facility];  Service: Urology;  Laterality: N/A;  No seeds in bladder per Dr. Lilli Light LAMINECTOMY/DECOMPRESSION MICRODISCECTOMY Left 05/28/2021   Procedure: Left  - Lumbar five-Sacral one Laminectomy resection of synovial cyst;  Surgeon: Earnie Larsson, MD;  Location: [place];  Service: Neurosurgery;  Laterality: Left;   PROSTATE BIOPSY     RADIOACTIVE SEED IMPLANT N/A 06/29/2019   Procedure: RADIOACTIVE SEED IMPLANT/BRACHYTHERAPY IMPLANT;  Surgeon: Alexis Frock, MD;  Location: [facility];  Service: Urology;  Laterality: N/A;  [place] N/A 06/29/2019   Procedure: SPACE OAR INSTILLATION;  Surgeon: Alexis Frock, MD;  Location[facility]  Service: Urology;  Laterality: N/A;   Social History:  reports that he has never smoked. He has never used smokeless tobacco. He reports current alcohol use of about 2.0 standard drinks per week. He reports that he does not use drugs.  Allergies  Allergen Reactions   Amlodipine Besylate Swelling and Other (See Comments)    Ankles swell    Family History  Problem Relation Age of Onset   Brain cancer Father         GBM    Prior to Admission medications   Medication Sig Start Date End Date Taking? Authorizing Provider  Acetaminophen 500 MG capsule Take 1,000 mg by mouth in the morning, at noon, and at bedtime.   Yes [provider]  atorvastatin (LIPITOR) 40 MG tablet Take 40 mg by mouth at bedtime. 09/01/19  Yes [provider]  b complex vitamins capsule Take 1 capsule by mouth daily.   Yes [provider]  CALCIUM PO Take 1,200 tablets by mouth daily.   Yes [provider]  ferrous sulfate 325 (65 FE) MG tablet Take 325 mg by mouth at bedtime.   Yes [provider]  gabapentin (NEURONTIN) 300 MG capsule Take 300 mg by mouth 3 (three) times daily.   Yes [provider]  Lidocaine 4 % PTCH Apply 2 patches topically daily.   Yes [provider]  methocarbamol (ROBAXIN) 750 MG tablet Take 750 mg by mouth 3 (three) times daily.   Yes [provider]  Multiple Vitamins-Iron (MULTIVITAMIN/IRON PO) Take 1 tablet by mouth daily with breakfast.   Yes [provider]  NIFEdipine (ADALAT CC) 60 MG 24 hr tablet Take 60 mg by mouth in the morning. 11/05/21  Yes [provider]  oxyCODONE (OXY IR/ROXICODONE) 5 MG immediate release tablet Take 5-10 mg by mouth every 4 (four) hours as needed (for pain).   Yes [provider]  tamsulosin (FLOMAX) 0.4 MG CAPS capsule TAKE 1 CAPSULE DAILY AFTER SUPPER. FOR UINARY URGENCY/FREQUENCY AFTERPROSTRATE RADIATION. Patient taking differently: Take 0.4 mg by mouth at bedtime. 11/27/19  Yes Tyler Pita, MD  telmisartan (MICARDIS) 80 MG tablet Take 80 mg by mouth daily. 02/25/21  Yes [provider]  vitamin C (ASCORBIC ACID) 500 MG tablet Take 500 mg by mouth daily.   Yes [provider]  zinc gluconate 50 MG tablet Take 50 mg by mouth daily. X 14 days, will end on 01-06-22   Yes [provider]  COVID-19 mRNA bivalent vaccine, Pfizer, (PFIZER COVID-19 VAC  BIVALENT) injection Inject into the muscle. Patient not taking: Reported on 12/14/2021 07/26/21   Carlyle Basques, MD    Physical Exam: Vitals:   01/03/22 1200 01/03/22 1215 01/03/22 1230 01/03/22 1245  BP: (!) 123/58 119/60 (!) 111/55 127/72  Pulse: 78 82 79 79  Resp: 15 20 18 15   Temp:      TempSrc:      SpO2: 99% 98% 98% 97%  Weight:      Height:        Constitutional: Elderly male who appears to be in no acute distress Eyes: PERRL, lids and conjunctivae normal ENMT: Mucous membranes are moist. Posterior pharynx clear of any exudate or lesions.  Neck: normal, supple, no masses, no thyromegaly Respiratory: clear to auscultation bilaterally, no wheezing, no crackles. Normal respiratory effort. No accessory muscle use.  Cardiovascular: Regular rate and rhythm, no murmurs / rubs / gallops. No extremity edema. 2+ pedal pulses. No carotid bruits.  Abdomen: no tenderness, no masses palpated. No hepatosplenomegaly. Bowel sounds positive.  Musculoskeletal: no clubbing / cyanosis.  Skin: Postsurgical wound of the lumbar spine appears to be intact without significant signs of erythema or drainage appreciated. Neurologic: CN 2-12 grossly intact. Strength 4+/5 in all 4.  Psychiatric: Normal judgment and insight. Alert and oriented x 3. Normal mood.    Data Reviewed:  EKG normal sinus rhythm at 72 bpm    Assessment and Plan: * Near syncope secondary to orthostatic hypotension Patient presents after having episode of syncope today.  His wife notes that he just finished eating breakfast and started staring off.  She checked his blood pressures and noted that it was 60/30 and called EMS.  Records note this is happened least 2 other times 1 of which was during his last hospitalization for which cardiology had suspected symptoms worse secondary to a vasovagal event.  Orthostatic vital signs were noted to be positive.  High-sensitivity troponins were negative x2.  Patient has been bolused IV fluids  while in the ED. Home blood pressure medications include nifedipine 60 mg daily and Micardis 80 mg daily. -Admit to medical telemetry bed -Abdominal binder and TED hose -Hold possible precipitators of orthostatic hypotension including Robaxin more so than Flomax -Continued nifedipine -Recheck orthostatic vital signs in a.m. -PT to eval and treat -Question need of symptomatic -Follow-up telemetry overnight  Normocytic anemia Hemoglobin 9.2 which appears close to previous.  During his hospital stay patient had required 2 units of packed red blood cells to be transfused.  He had had negative colonoscopy, and has a capsule endoscopy scheduled for 3/22 for further evaluation. -Continue to monitor H&H  Epidural abscess Hospitalized at [facility] with an epidural abscess last month for which 1 of 4 cultures grew out 1 Cutibacterium acne.  ID recommended patient be on vancomycin and Rocephin for total of 6 weeks with PICC line initially placed on 2/16. -Continue vancomycin and Rocephin per pharmacy -Continue oxycodone as needed for pain  Hyponatremia Chronic.  Sodium level noted to be 127 which appears near patient's baseline from review of records. -Check urine sodium and urine potassium       Advance Care Planning:   Code Status: Full Code   Consults: None  Family Communication: Wife updated at bedside  Severity of Illness: The appropriate patient status for this patient is OBSERVATION. Observation status is judged to be reasonable and necessary in order to provide the required intensity of service to ensure the patient's safety. The patient's presenting symptoms, physical exam findings, and initial radiographic and laboratory data in the context of their medical condition is felt to place them at decreased risk for further clinical deterioration. Furthermore, it is anticipated that the patient will be medically stable for discharge from the hospital within 2 midnights of admission.    Author: Norval Morton, MD 01/03/2022 1:43 PM  For on call review [email or web address].

## 2022-01-03 NOTE — Assessment & Plan Note (Signed)
Hemoglobin 9.2 which appears close to previous.  During his hospital stay patient had required 2 units of packed red blood cells to be transfused.  He had had negative colonoscopy, and has a capsule endoscopy scheduled for 3/22 for further evaluation. ?-Continue to monitor H&H ?

## 2022-01-03 NOTE — ED Triage Notes (Signed)
PT BIB [facility] due to syncopal episode witnessed by wife this morning while eating breakfast. PT last seen normal at 0730. Pt has history of spinal fusion in [facility] 1/30. Pt had vasovagal episode following discharge from [place]. Per wife pt was altered this morning following episode. Pt given 400 mL fluid while on EMS. B/p 96/38 ?

## 2022-01-03 NOTE — Assessment & Plan Note (Signed)
Chronic.  Sodium level noted to be 127 which appears near patient's baseline from review of records. ?-Check urine sodium and urine potassium ?

## 2022-01-04 DIAGNOSIS — D649 Anemia, unspecified: Secondary | ICD-10-CM | POA: Diagnosis present

## 2022-01-04 DIAGNOSIS — I10 Essential (primary) hypertension: Secondary | ICD-10-CM | POA: Diagnosis present

## 2022-01-04 DIAGNOSIS — Z20822 Contact with and (suspected) exposure to covid-19: Secondary | ICD-10-CM | POA: Diagnosis present

## 2022-01-04 DIAGNOSIS — I951 Orthostatic hypotension: Secondary | ICD-10-CM | POA: Diagnosis present

## 2022-01-04 DIAGNOSIS — E871 Hypo-osmolality and hyponatremia: Secondary | ICD-10-CM | POA: Diagnosis present

## 2022-01-04 DIAGNOSIS — Z87820 Personal history of traumatic brain injury: Secondary | ICD-10-CM | POA: Diagnosis not present

## 2022-01-04 DIAGNOSIS — R55 Syncope and collapse: Secondary | ICD-10-CM | POA: Diagnosis present

## 2022-01-04 DIAGNOSIS — Z8546 Personal history of malignant neoplasm of prostate: Secondary | ICD-10-CM | POA: Diagnosis not present

## 2022-01-04 DIAGNOSIS — Z981 Arthrodesis status: Secondary | ICD-10-CM | POA: Diagnosis not present

## 2022-01-04 DIAGNOSIS — Z79899 Other long term (current) drug therapy: Secondary | ICD-10-CM | POA: Diagnosis not present

## 2022-01-04 DIAGNOSIS — G4730 Sleep apnea, unspecified: Secondary | ICD-10-CM | POA: Diagnosis present

## 2022-01-04 DIAGNOSIS — Z808 Family history of malignant neoplasm of other organs or systems: Secondary | ICD-10-CM | POA: Diagnosis not present

## 2022-01-04 DIAGNOSIS — D6489 Other specified anemias: Secondary | ICD-10-CM | POA: Diagnosis present

## 2022-01-04 LAB — CBC
HCT: 23.5 % — ABNORMAL LOW (ref 39.0–52.0)
Hemoglobin: 8.1 g/dL — ABNORMAL LOW (ref 13.0–17.0)
MCH: 31.9 pg (ref 26.0–34.0)
MCHC: 34.5 g/dL (ref 30.0–36.0)
MCV: 92.5 fL (ref 80.0–100.0)
Platelets: 261 10*3/uL (ref 150–400)
RBC: 2.54 MIL/uL — ABNORMAL LOW (ref 4.22–5.81)
RDW: 13.2 % (ref 11.5–15.5)
WBC: 3.1 10*3/uL — ABNORMAL LOW (ref 4.0–10.5)
nRBC: 0 % (ref 0.0–0.2)

## 2022-01-04 LAB — SODIUM, URINE, RANDOM: Sodium, Ur: 117 mmol/L

## 2022-01-04 LAB — BASIC METABOLIC PANEL
Anion gap: 10 (ref 5–15)
BUN: 7 mg/dL — ABNORMAL LOW (ref 8–23)
CO2: 25 mmol/L (ref 22–32)
Calcium: 8.5 mg/dL — ABNORMAL LOW (ref 8.9–10.3)
Chloride: 93 mmol/L — ABNORMAL LOW (ref 98–111)
Creatinine, Ser: 0.56 mg/dL — ABNORMAL LOW (ref 0.61–1.24)
GFR, Estimated: >60 mL/min (ref >60)
Glucose, Bld: 91 mg/dL (ref 70–99)
Potassium: 3.6 mmol/L (ref 3.5–5.1)
Sodium: 128 mmol/L — ABNORMAL LOW (ref 135–145)

## 2022-01-04 LAB — OSMOLALITY, URINE: Osmolality, Ur: 464 mOsm/kg (ref 300–900)

## 2022-01-04 MED ORDER — SODIUM CHLORIDE 0.9 % IV SOLN: INTRAVENOUS | Status: DC

## 2022-01-04 MED ORDER — MIDODRINE HCL 5 MG PO TABS
2.5000 mg | ORAL_TABLET | Freq: Three times a day (TID) | ORAL | Status: DC
  Administered 2022-01-04: 2.5 mg via ORAL
  Filled 2022-01-04 (×2): qty 1

## 2022-01-04 NOTE — Progress Notes (Signed)
PROGRESS NOTE    Cody Gonzalez  QMG:[id number] DOB: 1946-08-30 DOA: 01/03/2022 PCP: [name], FNP    Brief Narrative:  Cody Gonzalez is a 76 y.o. male with medical history significant of hypertension, anemia, chronic hyponatremia, and prostate cancer who presents after having a near syncopal episode this morning.   3/4 found to be orthostatics  Consultants:    Procedures:   Antimicrobials:      Subjective: No complaints this am. Denies dizziness , sob, cp at rest  Objective: Vitals:   01/03/22 2125 01/04/22 0008 01/04/22 0411 01/04/22 0712  BP: 136/62   (!) 156/69  Pulse: 75   70  Resp: 19   17  Temp: 98.7 F (37.1 C) 98.5 F (36.9 C) 98.8 F (37.1 C) 98.2 F (36.8 C)  TempSrc: Oral Oral Oral Oral  SpO2: 96%   95%  Weight:      Height:        Intake/Output Summary (Last 24 hours) at 01/04/2022 1203 Last data filed at 01/04/2022 0850 Gross per 24 hour  Intake 2528.9 ml  Output 2550 ml  Net -21.1 ml   Filed Weights   01/03/22 1022 01/03/22 1024 01/03/22 2121  Weight: 113.4 kg 72.6 kg 76.3 kg    Examination: Calm, NAD Cta no w/r Reg s1/s2 no gallop Soft benign +bs No edema Aaoxox3  Mood and affect appropriate in current setting     Data Reviewed: I have personally reviewed following labs and imaging studies  CBC: Recent Labs  Lab 01/03/22 1107 01/04/22 0424  WBC 4.8 3.1*  NEUTROABS 4.1  --   HGB 9.2* 8.1*  HCT 27.0* 23.5*  MCV 96.1 92.5  PLT 284 891   Basic Metabolic Panel: Recent Labs  Lab 01/03/22 1107 01/04/22 0424  NA 127* 128*  K 4.2 3.6  CL 92* 93*  CO2 23 25  GLUCOSE 129* 91  BUN 11 7*  CREATININE 0.79 0.56*  CALCIUM 8.6* 8.5*   GFR: Estimated Creatinine Clearance: 72.3 mL/min (A) (by C-G formula based on SCr of 0.56 mg/dL (L)). Liver Function Tests: No results for input(s): AST, ALT, ALKPHOS, BILITOT, PROT, ALBUMIN in the last 168 hours. No results for input(s): LIPASE, AMYLASE in the last 168  hours. No results for input(s): AMMONIA in the last 168 hours. Coagulation Profile: No results for input(s): INR, PROTIME in the last 168 hours. Cardiac Enzymes: No results for input(s): CKTOTAL, CKMB, CKMBINDEX, TROPONINI in the last 168 hours. BNP (last 3 results) No results for input(s): PROBNP in the last 8760 hours. HbA1C: No results for input(s): HGBA1C in the last 72 hours. CBG: Recent Labs  Lab 01/03/22 1148  GLUCAP 128*   Lipid Profile: No results for input(s): CHOL, HDL, LDLCALC, TRIG, CHOLHDL, LDLDIRECT in the last 72 hours. Thyroid Function Tests: No results for input(s): TSH, T4TOTAL, FREET4, T3FREE, THYROIDAB in the last 72 hours. Anemia Panel: No results for input(s): VITAMINB12, FOLATE, FERRITIN, TIBC, IRON, RETICCTPCT in the last 72 hours. Sepsis Labs: No results for input(s): PROCALCITON, LATICACIDVEN in the last 168 hours.  Recent Results (from the past 240 hour(s))  Resp Panel by RT-PCR (Flu A&B, Covid) Nasopharyngeal Swab     Status: None   Collection Time: 01/03/22 10:24 AM   Specimen: Nasopharyngeal Swab; Nasopharyngeal(NP) swabs in vial transport medium  Result Value Ref Range Status   SARS Coronavirus 2 by RT PCR NEGATIVE NEGATIVE Final    Comment: (NOTE) SARS-CoV-2 target nucleic acids are NOT DETECTED.  The SARS-CoV-2  RNA is generally detectable in upper respiratory specimens during the acute phase of infection. The lowest concentration of SARS-CoV-2 viral copies this assay can detect is 138 copies/mL. A negative result does not preclude SARS-Cov-2 infection and should not be used as the sole basis for treatment or other patient management decisions. A negative result may occur with  improper specimen collection/handling, submission of specimen other than nasopharyngeal swab, presence of viral mutation(s) within the areas targeted by this assay, and inadequate number of viral copies(<138 copies/mL). A negative result must be combined  with clinical observations, patient history, and epidemiological information. The expected result is Negative.  Fact Sheet for Patients:  EntrepreneurPulse.com.au  Fact Sheet for Healthcare Providers:  IncredibleEmployment.be  This test is no t yet approved or cleared by the [place] FDA and  has been authorized for detection and/or diagnosis of SARS-CoV-2 by FDA under an Emergency Use Authorization (EUA). This EUA will remain  in effect (meaning this test can be used) for the duration of the COVID-19 declaration under Section 564(b)(1) of the Act, 21 U.S.C.section 360bbb-3(b)(1), unless the authorization is terminated  or revoked sooner.       Influenza A by PCR NEGATIVE NEGATIVE Final   Influenza B by PCR NEGATIVE NEGATIVE Final    Comment: (NOTE) The Xpert Xpress SARS-CoV-2/FLU/RSV plus assay is intended as an aid in the diagnosis of influenza from Nasopharyngeal swab specimens and should not be used as a sole basis for treatment. Nasal washings and aspirates are unacceptable for Xpert Xpress SARS-CoV-2/FLU/RSV testing.  Fact Sheet for Patients: EntrepreneurPulse.com.au  Fact Sheet for Healthcare Providers: IncredibleEmployment.be  This test is not yet approved or cleared by the [place] FDA and has been authorized for detection and/or diagnosis of SARS-CoV-2 by FDA under an Emergency Use Authorization (EUA). This EUA will remain in effect (meaning this test can be used) for the duration of the COVID-19 declaration under Section 564(b)(1) of the Act, 21 U.S.C. section 360bbb-3(b)(1), unless the authorization is terminated or revoked.  Performed at [facility], [place] [address] [place], [place] [address]          Radiology Studies: CT Head Wo Contrast  Result Date: 01/03/2022 CLINICAL DATA:  Altered mental status EXAM: CT HEAD WITHOUT CONTRAST TECHNIQUE: Contiguous axial  images were obtained from the base of the skull through the vertex without intravenous contrast. RADIATION DOSE REDUCTION: This exam was performed according to the departmental dose-optimization program which includes automated exposure control, adjustment of the mA and/or kV according to patient size and/or use of iterative reconstruction technique. COMPARISON:  None. FINDINGS: Brain: No acute intracranial hemorrhage, mass effect, or herniation. No extra-axial fluid collections. No evidence of acute territorial infarct. No hydrocephalus. Mild cortical volume loss. Patchy hypodensities in the periventricular and subcortical white matter, likely secondary to chronic microvascular ischemic changes. Vascular: No hyperdense vessel or unexpected calcification. Skull: Normal. Negative for fracture or focal lesion. Sinuses/Orbits: No acute finding. Other: None. IMPRESSION: Chronic changes with no acute intracranial process identified. Electronically Signed   By: Ofilia Neas M.D.   On: 01/03/2022 11:04   DG CHEST PORT 1 VIEW  Result Date: 01/03/2022 CLINICAL DATA:  Near syncope EXAM: PORTABLE CHEST 1 VIEW COMPARISON:  12/14/2021 FINDINGS: Right-sided PICC line with the tip projecting over the SVC. No focal consolidation. No pleural effusion or pneumothorax. Heart and mediastinal contours are unremarkable. No acute osseous abnormality. IMPRESSION: No active disease. Electronically Signed   By: Kathreen Devoid M.D.   On: 01/03/2022  14:25        Scheduled Meds:  atorvastatin  40 mg Oral QHS   Chlorhexidine Gluconate Cloth  6 each Topical Daily   enoxaparin (LOVENOX) injection  40 mg Subcutaneous Q24H   gabapentin  300 mg Oral TID   lidocaine  2 patch Transdermal Daily   midodrine  2.5 mg Oral TID WC   sodium chloride flush  10-40 mL Intracatheter Q12H   sodium chloride flush  3 mL Intravenous Q12H   Continuous Infusions:  sodium chloride 75 mL/hr at 01/04/22 0647   cefTRIAXone (ROCEPHIN)  IV Stopped  (01/03/22 1737)   vancomycin Stopped (01/03/22 1532)    Assessment & Plan:   Principal Problem:   Near syncope secondary to orthostatic hypotension Active Problems:   Orthostatic hypotension   Hyponatremia   Epidural abscess   Normocytic anemia  Near syncope  2/2 orthostatic hypotension  This a.m. he continues to be orthostatics Overall give IV fluids  Have him wear knee-high compression stockings 20 to 30 mmHg  Add low-dose midodrine 2.5 mg p.o. 3 times daily  Hold BP meds for now      Normocytic anemia Hemoglobin 9.2 which appears close to previous.  During his hospital stay patient had required 2 units of packed red blood cells to be transfused.  He had had negative colonoscopy, and has a capsule endoscopy scheduled for 3/22 for further evaluation. 3/4 H&H stable overall but little down due to hydration. Continue to monitor   Epidural abscess Hospitalized at [facility] with an epidural abscess last month for which 1 of 4 cultures grew out 1 Cutibacterium acne.  ID recommended patient be on vancomycin and Rocephin for total of 6 weeks with PICC line initially placed on 2/16. -3/4 continue with vancomycin and Rocephin per pharmacy  Oxycodone for pain  Has an appointment on Tuesday at [facility] for follow-up     Hyponatremia Chronic, asymptomatic      DVT prophylaxis: Lovenox Code Status: Full  Family Communication: Wife at bedside Disposition Plan:  Status is: Observation The patient remains OBS appropriate and will d/c before 2 midnights.              LOS: 0 days   Time spent: 45 minutes with more than 50% on COC    Nolberto Hanlon, MD [facility] Pager [phone]  If 7PM-7AM, please contact night-coverage 01/04/2022, 12:03 PM

## 2022-01-04 NOTE — Progress Notes (Signed)
Wife called staff about PICC line, stating she confirmed and its ok to use. PICC line flushed and infusing with NS.  ?

## 2022-01-04 NOTE — Progress Notes (Addendum)
Resumed care after 3 pm. Wanted to use PICC line for administration of fluids and antibiotic. Also waned to flush PICC line and d/c PIV. Patient wanted me to continue using PIV and don't touch PICC line "stated [facility] needs to be contacted about PICC line". Wife wanted me to wait until she comes back to the room so we can discuss this in person. Continue with using PIV line per pt wishes, will wait on wife.  ?

## 2022-01-04 NOTE — Evaluation (Signed)
Physical Therapy Evaluation Patient Details Name: Cody Gonzalez MRN: [id number] DOB: 1946-06-20 Today's Date: 01/04/2022  History of Present Illness  The pt is a 76 yo male presenting 3/3 following syncopal episode at home with BP 98/38. PMH includes: recent spinal surgery at [facility] ( PLIF L2-S1 fusion on 1/30), anemia, HTN, prostate cancer, sleep apnea, and TBI.   Clinical Impression  Pt in bed upon arrival of PT, agreeable to evaluation at this time. Since last d/c from [place] (recent spine surgery) the pt has been mobilizing with supervision and use of RW, up to distances of 120 ft in the home with HHPT. The pt continues to require assist for ADLs as well and is also receiving HHOT. The pt was able to complete multiple sit-stand transfers with minA to steady and max cues for UE support and placement. He was also able to complete multiple short bouts of ambulation in the room and to the hallway with minA to steady and again max cues for posture, positioning in RW, and improved stride length and clearance. The pt reports no dizziness at this time and BP stable (see below). Will continue to benefit from skilled PT acutely to progress mobility as the pt's wife expressed significant concern over possibility of backsliding during this hospitalization. Will benefit from maximal mobility during admission.   VITALS: (BP meds were held this morning) - supine in bed- BP: 162/79 (101); - sitting EOB - BP: 164/97 (118); - standing - BP: 158/69 (95); - standing after mobility - BP: 147/71 (92); - sitting end of session - 152/93 (105)     Recommendations for follow up therapy are one component of a multi-disciplinary discharge planning process, led by the attending physician.  Recommendations may be updated based on patient status, additional functional criteria and insurance authorization.  Follow Up Recommendations Home health PT (resume)    Assistance Recommended at Discharge Frequent or constant  Supervision/Assistance  Patient can return home with the following  A little help with walking and/or transfers;A little help with bathing/dressing/bathroom;Assistance with cooking/housework;Direct supervision/assist for medications management;Assist for transportation;Help with stairs or ramp for entrance    Equipment Recommendations None recommended by PT  Recommendations for Other Services       Functional Status Assessment Patient has had a recent decline in their functional status and demonstrates the ability to make significant improvements in function in a reasonable and predictable amount of time.     Precautions / Restrictions Precautions Precautions: Fall;Back Precaution Comments: pt s/p lumbar surgery at [facility] 1/30. family says they have a brace but no instructions on when to wear brace Restrictions Weight Bearing Restrictions: No Other Position/Activity Restrictions: watch BP      Mobility  Bed Mobility Overal bed mobility: Needs Assistance Bed Mobility: Sidelying to Sit, Rolling Rolling: Min guard Sidelying to sit: Min assist       General bed mobility comments: cues for log roll. minA to elevate trunk    Transfers Overall transfer level: Needs assistance Equipment used: Rolling walker (2 wheels) Transfers: Sit to/from Stand Sit to Stand: Min assist           General transfer comment: minA with minA to steady in standing as pt with posterior lean in standing. cues for hand placement each rep    Ambulation/Gait Ambulation/Gait assistance: Min assist Gait Distance (Feet): 5 Feet (+ 15 + 25 ft) Assistive device: Rolling walker (2 wheels) Gait Pattern/deviations: Step-to pattern, Decreased stride length, Shuffle, Trunk flexed Gait velocity: decreased Gait velocity interpretation: <  1.31 ft/sec, indicative of household ambulator   General Gait Details: small, shuffling steps with max cues for stride length and posture      Balance Overall balance  assessment: Needs assistance Sitting-balance support: No upper extremity supported Sitting balance-Leahy Scale: Fair     Standing balance support: Bilateral upper extremity supported, Reliant on assistive device for balance Standing balance-Leahy Scale: Poor Standing balance comment: dependent on BUE support and external support                             Pertinent Vitals/Pain Pain Assessment Pain Assessment: No/denies pain    Home Living Family/patient expects to be discharged to:: Private residence Living Arrangements: Spouse/significant other Available Help at Discharge: Family;Available 24 hours/day Type of Home: House Home Access: Stairs to enter Entrance Stairs-Rails: Left Entrance Stairs-Number of Steps: 4   Home Layout: One level Home Equipment: Grab bars - tub/shower;Hand held shower head;Shower seat Additional Comments: no animals. wife can do all driving    Prior Function Prior Level of Function : Needs assist       Physical Assist : Mobility (physical);ADLs (physical) Mobility (physical): Gait;Stairs ADLs (physical): Grooming;Bathing;Dressing;Toileting;IADLs Mobility Comments: following back surgery and second evacuation, pt needing supervision for gait, walking 120 ft with HHPT ADLs Comments: pt needing assist from family and getting HHOT 2x/week     Hand Dominance   Dominant Hand: Right    Extremity/Trunk Assessment   Upper Extremity Assessment Upper Extremity Assessment: Overall WFL for tasks assessed    Lower Extremity Assessment Lower Extremity Assessment: Generalized weakness (poor functional power and muscular endurance)    Cervical / Trunk Assessment Cervical / Trunk Assessment: Back Surgery  Communication   Communication: HOH (hearing aides)  Cognition Arousal/Alertness: Awake/alert Behavior During Therapy: Flat affect Overall Cognitive Status: Impaired/Different from baseline Area of Impairment: Attention, Memory,  Safety/judgement, Problem solving                   Current Attention Level: Focused Memory: Decreased recall of precautions, Decreased short-term memory   Safety/Judgement: Decreased awareness of safety, Decreased awareness of deficits   Problem Solving: Difficulty sequencing, Requires verbal cues General Comments: pt able to follow simple cues but needing repeated reminders, asking about BP multiple times even after being told pressures. Pt defers to wife for most answers        General Comments General comments (skin integrity, edema, etc.): BP stable with gait and mobility    Exercises     Assessment/Plan    PT Assessment Patient needs continued PT services  PT Problem List Decreased strength;Decreased range of motion;Decreased activity tolerance;Decreased balance;Decreased mobility       PT Treatment Interventions DME instruction;Gait training;Functional mobility training;Stair training;Therapeutic activities;Therapeutic exercise;Balance training;Patient/family education    PT Goals (Current goals can be found in the Care Plan section)  Acute Rehab PT Goals Patient Stated Goal: return home, maintain mobility and not have another setback PT Goal Formulation: With patient/family Time For Goal Achievement: 01/18/22 Potential to Achieve Goals: Good    Frequency Min 3X/week        AM-PAC PT "6 Clicks" Mobility  Outcome Measure Help needed turning from your back to your side while in a flat bed without using bedrails?: A Little Help needed moving from lying on your back to sitting on the side of a flat bed without using bedrails?: A Little Help needed moving to and from a bed to a chair (including a  wheelchair)?: A Little Help needed standing up from a chair using your arms (e.g., wheelchair or bedside chair)?: A Little Help needed to walk in hospital room?: A Little Help needed climbing 3-5 steps with a railing? : A Little 6 Click Score: 18    End of Session  Equipment Utilized During Treatment: Gait belt Activity Tolerance: Patient tolerated treatment well Patient left: in chair;with call bell/phone within reach;with family/visitor present Nurse Communication: Mobility status PT Visit Diagnosis: Other abnormalities of gait and mobility (R26.89);Muscle weakness (generalized) (M62.81)    Time: 8016-5537 PT Time Calculation (min) (ACUTE ONLY): 38 min   Charges:   PT Evaluation $PT Eval Moderate Complexity: 1 Mod PT Treatments $Gait Training: 8-22 mins $Therapeutic Exercise: 8-22 mins        West Carbo, PT, DPT   Acute Rehabilitation Department Pager #: [phone]  Sandra Cockayne 01/04/2022, 2:01 PM

## 2022-01-05 MED ORDER — ACETAMINOPHEN 325 MG PO TABS: 650.0000 mg | ORAL_TABLET | Freq: Four times a day (QID) | ORAL | Status: DC | PRN

## 2022-01-05 MED ORDER — MIDODRINE HCL 2.5 MG PO TABS: 2.5000 mg | ORAL_TABLET | Freq: Three times a day (TID) | ORAL | 0 refills | Status: DC

## 2022-01-05 MED ORDER — IRBESARTAN 75 MG PO TABS
75.0000 mg | ORAL_TABLET | Freq: Every day | ORAL | Status: DC
  Filled 2022-01-05: qty 1

## 2022-01-05 MED ORDER — TELMISARTAN 20 MG PO TABS: 20.0000 mg | ORAL_TABLET | Freq: Every day | ORAL | 0 refills | Status: DC

## 2022-01-05 NOTE — Discharge Summary (Signed)
Cody Gonzalez QMG:[id number] DOB: [birth year] DOA: 01/03/2022  PCP: [name], FNP  Admit date: 01/03/2022 Discharge date: 01/05/2022  Admitted From: Home Disposition: Home  Recommendations for Outpatient Follow-up:  Follow up with PCP in 1 week Please obtain BMP/CBC in one week  Home Health: Yes   Discharge Condition:Stable CODE STATUS: Full Diet recommendation: Regular Brief/Interim Summary: Per TOI:[id number] A Berlinger is a 76 y.o. male with medical history significant of hypertension, anemia, chronic hyponatremia, and prostate cancer who presents after having a near syncopal episode this morning.  His wife is present at bedside and gives additional history.  Patient had been shaking prefaced this morning and was about to eat an orange when his wife noticed that he was pale in color and staring off.  She had gone to check his blood pressure and noted that it was 60/30 for which she immediately called 911.  She states that he never lost consciousness, but did not appear well.  He had been eating and drinking like normal.  He underwent lumbar spinal fusion on 1/30 at [facility].  Required readmission into their hospital after being found to have epidural abscess requiring subsequent washout currently receiving 6 weeks of IV antibiotics of vancomycin and Rocephin after 1 of 4 cultures grew Cutibacterium acne.  During this hospitalization patient had episode of syncope on 2/17 for which she was evaluated by cardiology and symptoms were thought secondary to vasovagal episode.  His wife reports another episode following his hospitalization but the patient stated the pain was grunting and lost consciousness which she called EMS.  She states that he has been eating and drinking okay at home.  He denies any complaints of chest pain or palpitations.  He has been taking muscle relaxants of Robaxin since January.   Upon admission into the emergency department patient was seen to be afebrile  with positive orthostatic vital signs.  Blood pressure lying 123/58 with pulse 72, blood pressure sitting 101/58 with pulse 90, and blood pressure 90/52 standing with heart rate 95.  CT scan of the head showed chronic changes without any acute intercranial abnormality. Labs significant for 2 hemoglobin 9.2, sodium 127, BUN 11, creatinine 0.79, and high-sensitivity troponin 12.  Fluids and COVID-19 screening were negative.    Near syncope  2/2 orthostatic hypotension  Was started on IV fluids for hydration Added midodrine Prescribed knee-high compression stockings 20 to 30 mmHg Adjusted blood pressure medication dosing and discontinued this on Repeat orthostatics improved, asymptomatic now       Normocytic anemia H&H at baseline No obvious bleeds On admission mildly higher hemoglobin likely due to hemoconcentration    Epidural abscess Hospitalized at [facility] with an epidural abscess last month for which 1 of 4 cultures grew out 1 Cutibacterium acne.  ID recommended patient be on vancomycin and Rocephin for total of 6 weeks with PICC line initially placed on 2/16. Continue vancomycin and Rocephin as outpatient Has a follow-up with [facility] in 2 days      Hyponatremia Chronic, asymptomatic    Discharge Diagnoses:  Principal Problem:   Near syncope secondary to orthostatic hypotension Active Problems:   Orthostatic hypotension   Hyponatremia   Epidural abscess   Normocytic anemia   Syncope    Discharge Instructions  Discharge Instructions     Call MD for:  persistant dizziness or light-headedness   Complete by: As directed    Discharge instructions   Complete by: As directed    Hydrate during the day  Increase activity slowly   Complete by: As directed    No wound care   Complete by: As directed       Allergies as of 01/05/2022       Reactions   Amlodipine Besylate Swelling, Other (See Comments)   Ankles swell        Medication List     STOP taking these  medications    Acetaminophen 500 MG capsule Replaced by: acetaminophen 325 MG tablet   methocarbamol 750 MG tablet Commonly known as: ROBAXIN   NIFEdipine 60 MG 24 hr tablet Commonly known as: ADALAT CC   Pfizer COVID-19 Vac Bivalent injection Generic drug: COVID-19 mRNA bivalent vaccine Therapist, music)   tamsulosin 0.4 MG Caps capsule Commonly known as: FLOMAX       TAKE these medications    acetaminophen 325 MG tablet Commonly known as: TYLENOL Take 2 tablets (650 mg total) by mouth every 6 (six) hours as needed for mild pain (or Fever >/= 101). Replaces: Acetaminophen 500 MG capsule   atorvastatin 40 MG tablet Commonly known as: LIPITOR Take 40 mg by mouth at bedtime.   b complex vitamins capsule Take 1 capsule by mouth daily.   CALCIUM PO Take 1,200 tablets by mouth daily.   ferrous sulfate 325 (65 FE) MG tablet Take 325 mg by mouth at bedtime.   gabapentin 300 MG capsule Commonly known as: NEURONTIN Take 300 mg by mouth 3 (three) times daily.   Lidocaine 4 % Ptch Apply 2 patches topically daily.   midodrine 2.5 MG tablet Commonly known as: PROAMATINE Take 1 tablet (2.5 mg total) by mouth 3 (three) times daily with meals.   MULTIVITAMIN/IRON PO Take 1 tablet by mouth daily with breakfast.   oxyCODONE 5 MG immediate release tablet Commonly known as: Oxy IR/ROXICODONE Take 5-10 mg by mouth every 4 (four) hours as needed (for pain).   telmisartan 20 MG tablet Commonly known as: MICARDIS Take 1 tablet (20 mg total) by mouth daily. What changed:  medication strength how much to take   vitamin C 500 MG tablet Commonly known as: ASCORBIC ACID Take 500 mg by mouth daily.   zinc gluconate 50 MG tablet Take 50 mg by mouth daily. X 14 days, will end on 01-06-22        Follow-up Information     [name], FNP Follow up in 1 week(s).   Specialty: Family Medicine Contact information: [place] [address] [phone]                 Allergies  Allergen Reactions   Amlodipine Besylate Swelling and Other (See Comments)    Ankles swell    Consultations:    Procedures/Studies: DG Lumbar Spine Complete  Result Date: 12/14/2021 CLINICAL DATA:  Back pain.  Lumbar fusion on 12/09/2021 EXAM: LUMBAR SPINE - COMPLETE 4+ VIEW COMPARISON:  10/24/2021 FINDINGS: Postsurgical changes from posterior fusion and decompression with rod and screw fixation hardware extending from L2 to S1 and interbody spacers at L3-4, L4-5, and L5-S1. No apparent postoperative complication radiographically. No evidence of fracture. Overlying skin staples. Dense abdominal aortic atherosclerotic calcification. IMPRESSION: Postsurgical changes from posterior fusion L2 to S1 without evidence of hardware complication. Electronically Signed   By: Davina Poke D.O.   On: 12/14/2021 13:50   CT Head Wo Contrast  Result Date: 01/03/2022 CLINICAL DATA:  Altered mental status EXAM: CT HEAD WITHOUT CONTRAST TECHNIQUE: Contiguous axial images were obtained from the base of the skull  through the vertex without intravenous contrast. RADIATION DOSE REDUCTION: This exam was performed according to the departmental dose-optimization program which includes automated exposure control, adjustment of the mA and/or kV according to patient size and/or use of iterative reconstruction technique. COMPARISON:  None. FINDINGS: Brain: No acute intracranial hemorrhage, mass effect, or herniation. No extra-axial fluid collections. No evidence of acute territorial infarct. No hydrocephalus. Mild cortical volume loss. Patchy hypodensities in the periventricular and subcortical white matter, likely secondary to chronic microvascular ischemic changes. Vascular: No hyperdense vessel or unexpected calcification. Skull: Normal. Negative for fracture or focal lesion. Sinuses/Orbits: No acute finding. Other: None. IMPRESSION: Chronic changes with no acute intracranial process identified.  Electronically Signed   By: Ofilia Neas M.D.   On: 01/03/2022 11:04   MR Lumbar Spine W Wo Contrast  Result Date: 12/15/2021 CLINICAL DATA:  Initial evaluation for severe back pain, leukocytosis, recent surgery. EXAM: MRI LUMBAR SPINE WITHOUT AND WITH CONTRAST TECHNIQUE: Multiplanar and multiecho pulse sequences of the lumbar spine were obtained without and with intravenous contrast. CONTRAST:  19m GADAVIST GADOBUTROL 1 MMOL/ML IV SOLN COMPARISON:  Radiograph from earlier the same day. FINDINGS: Segmentation: Standard. Lowest well-formed disc space labeled the L5-S1 level. Alignment: 5 mm anterolisthesis of L4 on L5, with trace retrolisthesis of L2 on L3 and L3 on L4. Vertebrae: Susceptibility artifact related to recent posterior fusion at L2 through the sacrum. Prior interbody fusion at L3-4, L4-5, and L5-S1. Vertebral body height maintained without acute or interval fracture. No convincing evidence for osteomyelitis discitis or septic arthritis. Conus medullaris and cauda equina: Conus extends to the L1 level. Mass effect on the distal conus and nerve roots of the cauda equina due to an epidural collection as described below. Paraspinal and other soft tissues: Heterogeneous signal abnormality seen throughout the posterior paraspinous soft tissues related to recent surgery. Skin staples remain in place. There is greater than expected edema and enhancement throughout the posterior paraspinous musculature, concerning for infection. Residual collection along the lower midline incision measures up to approximately 3.0 x 1.6 x 5.6 cm (series 8, image 15). Collection extends along the midline incision to the overlying skin. Associated sub fascial involvement towards the underlying laminectomy defects. While these findings may in part be postoperative in nature, possible infection with abscess formation could be considered given the correct clinical setting. There is an additional rim enhancing collection at the  posterior aspect of the right psoas muscle measuring 2.4 x 1.1 cm (series 8, image 13), also concerning for abscess. Additionally, there is a heterogeneous collection involving the dorsal epidural space extending from T12 to approximately L4-5, concerning for possible abscess (series 5, image 9). This measures up to approximately 10 mm in greatest AP diameter within the central and right dorsal epidural space at the levels of T12-L1 through L2-3 (series 8, images 3-23). While this is likely at least in part epidural in location, this appears to be at least partially intradural in nature on postcontrast sequence (series 12, image 9 for example). Compression of the nerve roots of the cauda equina anteriorly. Disc levels: T12-L1: Negative interspace. Epidural abscess within the dorsal epidural space with mass effect on the posterior thecal sac. Associated moderate to severe spinal stenosis, with the thecal sac measuring 6-7 mm in AP diameter. Foramina remain patent. L1-2: Minimal disc bulge. Epidural abscess within the central and right dorsal epidural space with secondary mass effect of the thecal sac anteriorly. Resultant severe canal with right worse than left lateral recess stenosis (series 8,  image 11). Underlying mild facet hypertrophy. Foramina appear patent. L2-3: Degenerative intervertebral disc space narrowing with diffuse disc bulge. Probable epidural abscess involving the right greater than left epidural space with compression of the thecal sac and severe spinal stenosis. Thecal sac compressed measuring 5 mm in transverse diameter at its most narrow point. Prior posterior fusion with residual facet hypertrophy. Foramina appear patent. L3-4: Prior posterior and interbody fusion with posterior decompression. Probable epidural abscess within the dorsal epidural space. Thecal sac is compressed measuring 5 mm in AP diameter at its most narrow point (series 6, image 23). Residual moderate to severe bilateral L3  foraminal stenosis. L4-5: Anterolisthesis. Prior posterior and interbody fusion with posterior decompression. The epidural abscess appears to not significantly involve this level, with no significant spinal stenosis. Residual moderate left with mild to moderate right L4 foraminal narrowing. L5-S1: Sequelae of prior posterior and interbody fusion with posterior decompression. Residual mild disc bulge. No significant spinal stenosis. Foramina appear patent. IMPRESSION: 1. Postoperative changes from recent posterior fusion and decompression at L2 through the sacrum. Superimposed epidural and/or intradural collection involving the dorsal epidural space and thecal sac, extending from T12 through approximately L3-4, most concerning for possible infection and abscess formation given provided history. Resultant moderate to severe diffuse stenosis at T12-L1 through L3-4 with compression of the cauda equina. Emergent neuro surgical consultation recommended. 2. Residual 3.0 x 1.6 x 5.6 cm postoperative collection along the lower midline incision. While this may reflect a benign postoperative seroma, superimposed infection could be considered as well. 3. Additional separate 2.4 x 1.1 cm collection at the posterior aspect of the right psoas muscle, concerning for intramuscular abscess. Critical Value/emergent results were called by telephone at the time of interpretation on 12/15/2021 at 12:00 am to provider [facility] , who verbally acknowledged these results. Electronically Signed   By: Jeannine Boga M.D.   On: 12/15/2021 00:08   DG CHEST PORT 1 VIEW  Result Date: 01/03/2022 CLINICAL DATA:  Near syncope EXAM: PORTABLE CHEST 1 VIEW COMPARISON:  12/14/2021 FINDINGS: Right-sided PICC line with the tip projecting over the SVC. No focal consolidation. No pleural effusion or pneumothorax. Heart and mediastinal contours are unremarkable. No acute osseous abnormality. IMPRESSION: No active disease. Electronically Signed    By: Kathreen Devoid M.D.   On: 01/03/2022 14:25   DG Chest Portable 1 View  Result Date: 12/14/2021 CLINICAL DATA:  Chest pain radiating to the back. Recent spine surgery. EXAM: PORTABLE CHEST 1 VIEW COMPARISON:  05/19/2019. FINDINGS: Cardiac silhouette is normal in size. Normal mediastinal and hilar contours. Clear lungs.  No pleural effusion or pneumothorax. Previous anterior cervical spine fusion, new since the prior radiographs. Skeletal structures are grossly intact. IMPRESSION: No active disease. Electronically Signed   By: Lajean Manes M.D.   On: 12/14/2021 15:27   VAS [place] LOWER EXTREMITY VENOUS (DVT) (7a-7p)  Result Date: 12/15/2021  Lower Venous DVT Study Patient Name:  EBB CARELOCK  Date of Exam:   12/14/2021 Medical Rec #: [id number]               Accession #:    [id number] Date of Birth: 04-Mar-1946                Patient Gender: M Patient Age:   3 years Exam Location:  [facility] Procedure:      VAS [place] LOWER EXTREMITY VENOUS (DVT) Referring Phys: Charmaine Downs --------------------------------------------------------------------------------  Indications: Swelling.  Comparison Study: No prior study on file Performing Technologist:  Sharion Dove RVS  Examination Guidelines: A complete evaluation includes B-mode imaging, spectral Doppler, color Doppler, and power Doppler as needed of all accessible portions of each vessel. Bilateral testing is considered an integral part of a complete examination. Limited examinations for reoccurring indications may be performed as noted. The reflux portion of the exam is performed with the patient in reverse Trendelenburg.  +-----+---------------+---------+-----------+----------+--------------+  RIGHT Compressibility Phasicity Spontaneity Properties Thrombus Aging  +-----+---------------+---------+-----------+----------+--------------+  CFV   Full            Yes       Yes                                     +-----+---------------+---------+-----------+----------+--------------+   +---------+---------------+---------+-----------+----------+--------------+  LEFT      Compressibility Phasicity Spontaneity Properties Thrombus Aging  +---------+---------------+---------+-----------+----------+--------------+  CFV       Full            Yes       Yes                                    +---------+---------------+---------+-----------+----------+--------------+  SFJ       Full                                                             +---------+---------------+---------+-----------+----------+--------------+  FV Prox   Full                                                             +---------+---------------+---------+-----------+----------+--------------+  FV Mid    Full                                                             +---------+---------------+---------+-----------+----------+--------------+  FV Distal Full                                                             +---------+---------------+---------+-----------+----------+--------------+  PFV       Full                                                             +---------+---------------+---------+-----------+----------+--------------+  POP       Full            Yes       Yes                                    +---------+---------------+---------+-----------+----------+--------------+  PTV       Full                                                             +---------+---------------+---------+-----------+----------+--------------+  PERO      Full                                                             +---------+---------------+---------+-----------+----------+--------------+     Summary: LEFT: - There is no evidence of deep vein thrombosis in the lower extremity.  - No cystic structure found in the popliteal fossa.  *See table(s) above for measurements and observations. Electronically signed by Jamelle Haring on 12/15/2021 at 10:43:50 AM.    Final        Subjective: No shortness of breath, chest pain or dizziness  Discharge Exam: Vitals:   01/05/22 1118 01/05/22 1129  BP: 137/65   Pulse:  67  Resp: 18   Temp:  98.6 F (37 C)  SpO2:  97%   Vitals:   01/05/22 0721 01/05/22 0938 01/05/22 1118 01/05/22 1129  BP: (!) 176/65 122/83 137/65   Pulse: 71   67  Resp: '17 20 18   '$ Temp: 97.6 F (36.4 C)   98.6 F (37 C)  TempSrc: Oral   Oral  SpO2: 98%   97%  Weight:      Height:        General: Pt is alert, awake, not in acute distress Cardiovascular: RRR, S1/S2 +, no rubs, no gallops Respiratory: CTA bilaterally, no wheezing, no rhonchi Abdominal: Soft, NT, ND, bowel sounds + Extremities: no edema, no cyanosis    The results of significant diagnostics from this hospitalization (including imaging, microbiology, ancillary and laboratory) are listed below for reference.     Microbiology: Recent Results (from the past 240 hour(s))  Resp Panel by RT-PCR (Flu A&B, Covid) Nasopharyngeal Swab     Status: None   Collection Time: 01/03/22 10:24 AM   Specimen: Nasopharyngeal Swab; Nasopharyngeal(NP) swabs in vial transport medium  Result Value Ref Range Status   SARS Coronavirus 2 by RT PCR NEGATIVE NEGATIVE Final    Comment: (NOTE) SARS-CoV-2 target nucleic acids are NOT DETECTED.  The SARS-CoV-2 RNA is generally detectable in upper respiratory specimens during the acute phase of infection. The lowest concentration of SARS-CoV-2 viral copies this assay can detect is 138 copies/mL. A negative result does not preclude SARS-Cov-2 infection and should not be used as the sole basis for treatment or other patient management decisions. A negative result may occur with  improper specimen collection/handling, submission of specimen other than nasopharyngeal swab, presence of viral mutation(s) within the areas targeted by this assay, and inadequate number of viral copies(<138 copies/mL). A negative result must be combined  with clinical observations, patient history, and epidemiological information. The expected result is Negative.  Fact Sheet for Patients:  EntrepreneurPulse.com.au  Fact Sheet for Healthcare Providers:  IncredibleEmployment.be  This test is no t yet approved or cleared by the [place] FDA and  has been authorized for detection and/or diagnosis of SARS-CoV-2 by FDA under an Emergency Use Authorization (  EUA). This EUA will remain  in effect (meaning this test can be used) for the duration of the COVID-19 declaration under Section 564(b)(1) of the Act, 21 U.S.C.section 360bbb-3(b)(1), unless the authorization is terminated  or revoked sooner.       Influenza A by PCR NEGATIVE NEGATIVE Final   Influenza B by PCR NEGATIVE NEGATIVE Final    Comment: (NOTE) The Xpert Xpress SARS-CoV-2/FLU/RSV plus assay is intended as an aid in the diagnosis of influenza from Nasopharyngeal swab specimens and should not be used as a sole basis for treatment. Nasal washings and aspirates are unacceptable for Xpert Xpress SARS-CoV-2/FLU/RSV testing.  Fact Sheet for Patients: EntrepreneurPulse.com.au  Fact Sheet for Healthcare Providers: IncredibleEmployment.be  This test is not yet approved or cleared by the [place] FDA and has been authorized for detection and/or diagnosis of SARS-CoV-2 by FDA under an Emergency Use Authorization (EUA). This EUA will remain in effect (meaning this test can be used) for the duration of the COVID-19 declaration under Section 564(b)(1) of the Act, 21 U.S.C. section 360bbb-3(b)(1), unless the authorization is terminated or revoked.  Performed at [facility], [place] [address], [place], [place] [address]      Labs: BNP (last 3 results) No results for input(s): BNP in the last 8760 hours. Basic Metabolic Panel: Recent Labs  Lab 01/03/22 1107 01/04/22 0424  NA 127* 128*  K  4.2 3.6  CL 92* 93*  CO2 23 25  GLUCOSE 129* 91  BUN 11 7*  CREATININE 0.79 0.56*  CALCIUM 8.6* 8.5*   Liver Function Tests: No results for input(s): AST, ALT, ALKPHOS, BILITOT, PROT, ALBUMIN in the last 168 hours. No results for input(s): LIPASE, AMYLASE in the last 168 hours. No results for input(s): AMMONIA in the last 168 hours. CBC: Recent Labs  Lab 01/03/22 1107 01/04/22 0424  WBC 4.8 3.1*  NEUTROABS 4.1  --   HGB 9.2* 8.1*  HCT 27.0* 23.5*  MCV 96.1 92.5  PLT 284 261   Cardiac Enzymes: No results for input(s): CKTOTAL, CKMB, CKMBINDEX, TROPONINI in the last 168 hours. BNP: Invalid input(s): POCBNP CBG: Recent Labs  Lab 01/03/22 1148  GLUCAP 128*   D-Dimer No results for input(s): DDIMER in the last 72 hours. Hgb A1c No results for input(s): HGBA1C in the last 72 hours. Lipid Profile No results for input(s): CHOL, HDL, LDLCALC, TRIG, CHOLHDL, LDLDIRECT in the last 72 hours. Thyroid function studies No results for input(s): TSH, T4TOTAL, T3FREE, THYROIDAB in the last 72 hours.  Invalid input(s): FREET3 Anemia work up No results for input(s): VITAMINB12, FOLATE, FERRITIN, TIBC, IRON, RETICCTPCT in the last 72 hours. Urinalysis    Component Value Date/Time   COLORURINE AMBER (A) 12/14/2021 1431   APPEARANCEUR HAZY (A) 12/14/2021 1431   LABSPEC 1.019 12/14/2021 1431   PHURINE 6.0 12/14/2021 1431   GLUCOSEU NEGATIVE 12/14/2021 1431   HGBUR NEGATIVE 12/14/2021 1431   BILIRUBINUR NEGATIVE 12/14/2021 1431   KETONESUR NEGATIVE 12/14/2021 1431   PROTEINUR NEGATIVE 12/14/2021 1431   NITRITE NEGATIVE 12/14/2021 1431   LEUKOCYTESUR NEGATIVE 12/14/2021 1431   Sepsis Labs Invalid input(s): PROCALCITONIN,  WBC,  LACTICIDVEN Microbiology Recent Results (from the past 240 hour(s))  Resp Panel by RT-PCR (Flu A&B, Covid) Nasopharyngeal Swab     Status: None   Collection Time: 01/03/22 10:24 AM   Specimen: Nasopharyngeal Swab; Nasopharyngeal(NP) swabs in vial  transport medium  Result Value Ref Range Status   SARS Coronavirus 2 by RT PCR NEGATIVE NEGATIVE Final  Comment: (NOTE) SARS-CoV-2 target nucleic acids are NOT DETECTED.  The SARS-CoV-2 RNA is generally detectable in upper respiratory specimens during the acute phase of infection. The lowest concentration of SARS-CoV-2 viral copies this assay can detect is 138 copies/mL. A negative result does not preclude SARS-Cov-2 infection and should not be used as the sole basis for treatment or other patient management decisions. A negative result may occur with  improper specimen collection/handling, submission of specimen other than nasopharyngeal swab, presence of viral mutation(s) within the areas targeted by this assay, and inadequate number of viral copies(<138 copies/mL). A negative result must be combined with clinical observations, patient history, and epidemiological information. The expected result is Negative.  Fact Sheet for Patients:  EntrepreneurPulse.com.au  Fact Sheet for Healthcare Providers:  IncredibleEmployment.be  This test is no t yet approved or cleared by the [place] FDA and  has been authorized for detection and/or diagnosis of SARS-CoV-2 by FDA under an Emergency Use Authorization (EUA). This EUA will remain  in effect (meaning this test can be used) for the duration of the COVID-19 declaration under Section 564(b)(1) of the Act, 21 U.S.C.section 360bbb-3(b)(1), unless the authorization is terminated  or revoked sooner.       Influenza A by PCR NEGATIVE NEGATIVE Final   Influenza B by PCR NEGATIVE NEGATIVE Final    Comment: (NOTE) The Xpert Xpress SARS-CoV-2/FLU/RSV plus assay is intended as an aid in the diagnosis of influenza from Nasopharyngeal swab specimens and should not be used as a sole basis for treatment. Nasal washings and aspirates are unacceptable for Xpert Xpress SARS-CoV-2/FLU/RSV testing.  Fact  Sheet for Patients: EntrepreneurPulse.com.au  Fact Sheet for Healthcare Providers: IncredibleEmployment.be  This test is not yet approved or cleared by the [place] FDA and has been authorized for detection and/or diagnosis of SARS-CoV-2 by FDA under an Emergency Use Authorization (EUA). This EUA will remain in effect (meaning this test can be used) for the duration of the COVID-19 declaration under Section 564(b)(1) of the Act, 21 U.S.C. section 360bbb-3(b)(1), unless the authorization is terminated or revoked.  Performed at [facility], [place] [address] [place], [place] [address]      Time coordinating discharge: Over 30 minutes  SIGNED:   Nolberto Hanlon, MD  [facility] 01/05/2022, 12:08 PM Pager   If 7PM-7AM, please contact night-coverage [email or web address] Password [email or web address]
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
## 2022-01-09 ENCOUNTER — Encounter (HOSPITAL_COMMUNITY): Payer: Self-pay | Admitting: Radiology

## 2022-01-14 ENCOUNTER — Encounter: Payer: Self-pay | Admitting: Neurology

## 2022-01-14 ENCOUNTER — Ambulatory Visit (INDEPENDENT_AMBULATORY_CARE_PROVIDER_SITE_OTHER): Payer: Medicare Other | Admitting: Neurology

## 2022-01-14 VITALS — BP 171/80 | HR 72 | Ht 67.0 in | Wt 168.5 lb

## 2022-01-14 DIAGNOSIS — G4730 Sleep apnea, unspecified: Secondary | ICD-10-CM | POA: Diagnosis not present

## 2022-01-14 DIAGNOSIS — G4739 Other sleep apnea: Secondary | ICD-10-CM

## 2022-01-14 DIAGNOSIS — G3184 Mild cognitive impairment, so stated: Secondary | ICD-10-CM

## 2022-01-14 DIAGNOSIS — S069X1S Unspecified intracranial injury with loss of consciousness of 30 minutes or less, sequela: Secondary | ICD-10-CM
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
## 2022-01-20 ENCOUNTER — Encounter: Payer: Self-pay | Admitting: Cardiology

## 2022-01-20 ENCOUNTER — Ambulatory Visit: Payer: Medicare Other | Admitting: Cardiology

## 2022-01-20 ENCOUNTER — Other Ambulatory Visit: Payer: Self-pay

## 2022-01-20 VITALS — BP 167/77 | HR 75 | Temp 98.0°F | Resp 16 | Ht 67.0 in | Wt 163.7 lb

## 2022-01-20 DIAGNOSIS — I951 Orthostatic hypotension: Secondary | ICD-10-CM

## 2022-01-20 DIAGNOSIS — R55 Syncope and collapse: Secondary | ICD-10-CM

## 2022-01-20 DIAGNOSIS — E871 Hypo-osmolality and hyponatremia: Secondary | ICD-10-CM

## 2022-01-20 DIAGNOSIS — D649 Anemia, unspecified: Secondary | ICD-10-CM

## 2022-01-20 DIAGNOSIS — E782 Mixed hyperlipidemia: Secondary | ICD-10-CM

## 2022-01-20 DIAGNOSIS — I10 Essential (primary) hypertension: Secondary | ICD-10-CM

## 2022-01-20 NOTE — Progress Notes (Signed)
? ?Date:  01/20/2022  ? ?ID:  Cody Gonzalez, DOB Mar 23, 1946, MRN [id number] ? ?PCP:  [name], FNP  ?Cardiologist:  Rex Kras, DO, [facility] (established care 01/20/2022) ? ?REASON FOR CONSULT: Orthostatic hypotension, hypertension, syncope and collapse ? ?REQUESTING PHYSICIAN:  ?[name], FNP ?[address] ?[place],  [place] [address] ? ?Chief Complaint  ?Patient presents with  ? Orthostatic hypotension  ? New Patient (Initial Visit)  ? ? ?HPI  ?Cody Gonzalez is a 76 y.o. Caucasian male who presents to the office with a chief complaint of " orthostatic hypotension/near syncope/syncope with history of hypertension." Patient's past medical history and cardiovascular risk factors include: Benign essential hypertension, mixed hyperlipidemia, gastritis/iron deficiency anemia, prostate cancer, chronic hyponatremia, advanced age. ? ?He is referred to the office at the request of [name], FNP for evaluation of orthostatic hypotension. ? ?Patient is accompanied by his wife Cody Gonzalez at today's office visit who also provides collateral history as part of today's encounter. ? ?In January 2023 patient underwent lumbar spinal fusion surgery at [facility] and 10 days later was noted to have epidural abscess requiring urgent surgery on 12/15/2021.  During the same hospitalization he had a vasovagal episode where he did lose consciousness while working with PT OT.  He was evaluated by cardiology at [facility] and diagnosed with vasovagal syncope.  He was discharged from [facility] on December 23, 2021. ? ?Since then he has had 2 episodes at home around the time of eating breakfast or after breakfast of what his wife describes as near syncope.  Patient would get cold, diaphoretic, pale with subsequent low blood pressures.  After the second episode he was taken to [facility] in March 2023 where he was kept inpatient for 3 days for further monitoring.  At the time of discharge Flomax, nifedipine  both were discontinued and telmisartan was reduced from 80 mg to 20 mg.  Midodrine was also started as orthostatic vital signs were positive. ? ?Since discharge from [facility] he is followed up with his PCP and given his elevated blood pressures telmisartan was increased back to 40 mg p.o. daily and midodrine has been uses as needed. ? ?Since her recent hospitalization from [facility] he has not had any reoccurrence of near syncope or syncope.  His blood pressures are not trending higher and he has not required midodrine postdischarge. ? ?Other confounding factors include pain medications and muscle relaxants after surgery.  Robaxin was discontinued during his hospitalization at [facility], oxycodone stopped 4 days ago, and gabapentin stopped 2 days ago.  He also has underlying anemia for which she is under gastroenterology evaluation with upcoming capsule study later this week and that that is unremarkable plan is to refer him to hematology.  Of note, his hemoglobin back in April 2022 was around 12.4 g/dL and recently in March 2023 it was 8.1 g/dL He is currently on IV antibiotics via PICC line given the recent infection tentatively until January 27, 2022. ? ?He denies any chest pain or heart failure symptoms. ? ? ?FUNCTIONAL STATUS: ?Overall functional status is limited due to him ambulating with a walker and recent surgeries as described above. ? ?ALLERGIES: ?Allergies  ?Allergen Reactions  ? Amlodipine Besylate Swelling and Other (See Comments)  ?  Ankles swell  ? ? ?MEDICATION LIST PRIOR TO VISIT: ?Current Meds  ?Medication Sig  ? acetaminophen (TYLENOL) 325 MG tablet Take 2 tablets (650 mg total) by mouth every 6 (six)  hours as needed for mild pain (or Fever >/= 101).  ? atorvastatin (LIPITOR) 40 MG tablet Take 40 mg by mouth at bedtime.  ? b complex vitamins capsule Take 1 capsule by mouth daily.  ? CALCIUM PO Take 1,200 tablets by mouth daily.  ? cefTRIAXone (ROCEPHIN) 2 g injection   ? ferrous  sulfate 325 (65 FE) MG tablet Take 325 mg by mouth at bedtime.  ? Lidocaine 4 % PTCH Apply 2 patches topically daily.  ? Multiple Vitamins-Iron (MULTIVITAMIN/IRON PO) Take 1 tablet by mouth daily with breakfast.  ? telmisartan (MICARDIS) 20 MG tablet Take 1 tablet (20 mg total) by mouth daily. (Patient taking differently: Take 40 mg by mouth daily.)  ? vancomycin (VANCOCIN) 10 G SOLR injection Inject into the vein.  ? vitamin C (ASCORBIC ACID) 500 MG tablet Take 500 mg by mouth daily.  ? zinc gluconate 50 MG tablet Take 50 mg by mouth daily. X 14 days, will end on 01-06-22  ?  ? ?PAST MEDICAL HISTORY: ?Past Medical History:  ?Diagnosis Date  ? Anemia   ? Hypertension   ? Prostate cancer ([place])   ? Sleep apnea   ? uses cpap   ? TBI (traumatic brain injury) 2015  ? ? ?PAST SURGICAL HISTORY: ?Past Surgical History:  ?Procedure Laterality Date  ? BACK SURGERY    ? BILATERAL CARPAL TUNNEL RELEASE  2014  ? CYSTOSCOPY N/A 06/29/2019  ? Procedure: CYSTOSCOPY;  Surgeon: Alexis Frock, MD;  Location: [facility];  Service: Urology;  Laterality: N/A;  No seeds in bladder per Dr. Tresa Moore  ? LUMBAR LAMINECTOMY/DECOMPRESSION MICRODISCECTOMY Left 05/28/2021  ? Procedure: Left  - Lumbar five-Sacral one Laminectomy resection of synovial cyst;  Surgeon: Earnie Larsson, MD;  Location: [facility];  Service: Neurosurgery;  Laterality: Left;  ? PROSTATE BIOPSY    ? RADIOACTIVE SEED IMPLANT N/A 06/29/2019  ? Procedure: RADIOACTIVE SEED IMPLANT/BRACHYTHERAPY IMPLANT;  Surgeon: Alexis Frock, MD;  Location: [facility];  Service: Urology;  Laterality: N/A;  90 MINS  ? SPACE OAR INSTILLATION N/A 06/29/2019  ? Procedure: SPACE OAR INSTILLATION;  Surgeon: Alexis Frock, MD;  Location: [facility];  Service: Urology;  Laterality: N/A;  ? ? ?FAMILY HISTORY: ?The patient family history includes Brain cancer in his father. ? ?SOCIAL HISTORY:  ?The patient  reports that he has never smoked. He has never used  smokeless tobacco. He reports current alcohol use of about 2.0 standard drinks per week. He reports that he does not use drugs. ? ?REVIEW OF SYSTEMS: ?Review of Systems  ?Cardiovascular:  Negative for chest pain, dyspnea on exertion, leg swelling, near-syncope, orthopnea, palpitations, paroxysmal nocturnal dyspnea and syncope.  ?Respiratory:  Negative for shortness of breath.   ? ?PHYSICAL EXAM: ?Vitals with BMI 01/20/2022 01/14/2022 01/14/2022  ?Height '5\' 7"'$  - '5\' 7"'$   ?Weight 163 lbs 11 oz - 168 lbs 8 oz  ?BMI 25.63 - 26.38  ?Systolic 222 979 892  ?Diastolic 77 80 71  ?Pulse 75 - 72  ? ?Orthostatic VS for the past 72 hrs (Last 3 readings): ? Orthostatic BP Patient Position BP Location Cuff Size Orthostatic Pulse  ?01/20/22 1049 157/66 Standing Left Arm Normal 80  ?01/20/22 1047 164/73 Sitting Left Arm Normal 74  ?01/20/22 1046 161/81 Supine Left Arm Normal 68  ? ?CONSTITUTIONAL: Age appropriate, ambulate w/ 4 wheel walker, hemodynamically stable, well-nourished. No acute distress.  ?SKIN: Skin is warm and dry. No rash noted. No cyanosis. No pallor. No jaundice ?  HEAD: Normocephalic and atraumatic.  ?EYES: No scleral icterus ?MOUTH/THROAT: Moist oral membranes.  ?NECK: No JVD present. No thyromegaly noted. No carotid bruits  ?LYMPHATIC: No visible cervical adenopathy.  ?CHEST Normal respiratory effort. No intercostal retractions. Back brace.  ?LUNGS: Clear to auscultation bilaterally.  No stridor. No wheezes. No rales.  ?CARDIOVASCULAR: Regular rate and rhythm, positive S1-S2, no murmurs rubs or gallops appreciated. ?ABDOMINAL: Soft, nontender, nondistended, positive bowel sounds in all 4 quadrants, no apparent ascites.  ?EXTREMITIES: No peripheral edema, warm to touch ?HEMATOLOGIC: No significant bruising ?NEUROLOGIC: Oriented to person, place, and time. Nonfocal. Normal muscle tone.  ?PSYCHIATRIC: Normal mood and affect. Normal behavior. Cooperative ? ?CARDIAC DATABASE: ?EKG: ?01/20/2022: NSR, 69 bpm, normal axis,  without underlying ischemia injury pattern.   ? ?Echocardiogram: ?No results found for this or any previous visit from the past 1095 days. ?  ?Stress Testing: ?No results found for this or any previous visit fr

## 2022-01-27 ENCOUNTER — Telehealth: Payer: Self-pay | Admitting: Pharmacist

## 2022-01-27 ENCOUNTER — Other Ambulatory Visit: Payer: Self-pay | Admitting: Cardiology

## 2022-01-27 ENCOUNTER — Other Ambulatory Visit: Payer: Self-pay

## 2022-01-27 ENCOUNTER — Ambulatory Visit (HOSPITAL_COMMUNITY)
Admission: RE | Admit: 2022-01-27 | Discharge: 2022-01-27 | Disposition: A | Discharge status: Home / Self Care | Payer: Medicare Other | Source: Ambulatory Visit | Attending: Cardiology | Admitting: Cardiology

## 2022-01-27 DIAGNOSIS — I10 Essential (primary) hypertension: Secondary | ICD-10-CM

## 2022-01-27 DIAGNOSIS — I340 Nonrheumatic mitral (valve) insufficiency: Secondary | ICD-10-CM | POA: Insufficient documentation

## 2022-01-27 DIAGNOSIS — I951 Orthostatic hypotension: Secondary | ICD-10-CM | POA: Diagnosis not present

## 2022-01-27 DIAGNOSIS — I358 Other nonrheumatic aortic valve disorders: Secondary | ICD-10-CM | POA: Diagnosis not present

## 2022-01-27 DIAGNOSIS — R55 Syncope and collapse: Secondary | ICD-10-CM

## 2022-01-27 MED ORDER — TELMISARTAN 80 MG PO TABS: 80.0000 mg | ORAL_TABLET | Freq: Every day | ORAL | 2 refills | Status: DC

## 2022-01-27 NOTE — Telephone Encounter (Signed)
Reviewed with Dr. Terri Skains. Per Dr. Terri Skains, will increase telmisartan 40 mg to 80 mg. Labs in 1 week. Will continue monitoring home and follow up in 1 weeks to review lab results and home BP readings.  ?

## 2022-01-28 ENCOUNTER — Telehealth: Payer: Self-pay | Admitting: Nurse Practitioner

## 2022-01-28 ENCOUNTER — Other Ambulatory Visit: Payer: Self-pay | Admitting: Gastroenterology

## 2022-01-28 DIAGNOSIS — R933 Abnormal findings on diagnostic imaging of other parts of digestive tract: Secondary | ICD-10-CM

## 2022-01-28 DIAGNOSIS — R198 Other specified symptoms and signs involving the digestive system and abdomen: Secondary | ICD-10-CM

## 2022-01-28 NOTE — Telephone Encounter (Signed)
Scheduled appt per 3/28 referral. Pt is aware of appt date and time. Pt is aware to arrive 15 mins prior to appt time and to bring and updated insurance card. Pt is aware of appt location.   ?

## 2022-01-29 ENCOUNTER — Inpatient Hospital Stay: Payer: Medicare Other | Attending: Nurse Practitioner | Admitting: Nurse Practitioner

## 2022-01-29 ENCOUNTER — Encounter: Payer: Self-pay | Admitting: Nurse Practitioner

## 2022-01-29 ENCOUNTER — Inpatient Hospital Stay: Payer: Medicare Other

## 2022-01-29 ENCOUNTER — Other Ambulatory Visit: Payer: Self-pay

## 2022-01-29 VITALS — BP 170/85 | HR 76 | Temp 97.5°F | Resp 18 | Ht 67.0 in | Wt 163.0 lb

## 2022-01-29 DIAGNOSIS — M4800 Spinal stenosis, site unspecified: Secondary | ICD-10-CM | POA: Diagnosis not present

## 2022-01-29 DIAGNOSIS — Z8546 Personal history of malignant neoplasm of prostate: Secondary | ICD-10-CM | POA: Insufficient documentation

## 2022-01-29 DIAGNOSIS — G4730 Sleep apnea, unspecified: Secondary | ICD-10-CM | POA: Diagnosis not present

## 2022-01-29 DIAGNOSIS — D649 Anemia, unspecified: Secondary | ICD-10-CM | POA: Insufficient documentation

## 2022-01-29 DIAGNOSIS — M419 Scoliosis, unspecified: Secondary | ICD-10-CM | POA: Insufficient documentation

## 2022-01-29 DIAGNOSIS — I10 Essential (primary) hypertension: Secondary | ICD-10-CM | POA: Diagnosis not present

## 2022-01-29 LAB — CBC WITH DIFFERENTIAL (CANCER CENTER ONLY)
Abs Immature Granulocytes: 0.01 10*3/uL (ref 0.00–0.07)
Basophils Absolute: 0 10*3/uL (ref 0.0–0.1)
Basophils Relative: 1 %
Eosinophils Absolute: 0.1 10*3/uL (ref 0.0–0.5)
Eosinophils Relative: 2 %
HCT: 31.6 % — ABNORMAL LOW (ref 39.0–52.0)
Hemoglobin: 10.9 g/dL — ABNORMAL LOW (ref 13.0–17.0)
Immature Granulocytes: 0 %
Lymphocytes Relative: 10 %
Lymphs Abs: 0.4 10*3/uL — ABNORMAL LOW (ref 0.7–4.0)
MCH: 31.7 pg (ref 26.0–34.0)
MCHC: 34.5 g/dL (ref 30.0–36.0)
MCV: 91.9 fL (ref 80.0–100.0)
Monocytes Absolute: 0.5 10*3/uL (ref 0.1–1.0)
Monocytes Relative: 15 %
Neutro Abs: 2.6 10*3/uL (ref 1.7–7.7)
Neutrophils Relative %: 72 %
Platelet Count: 278 10*3/uL (ref 150–400)
RBC: 3.44 MIL/uL — ABNORMAL LOW (ref 4.22–5.81)
RDW: 13.8 % (ref 11.5–15.5)
Smear Review: NORMAL
WBC Count: 3.7 10*3/uL — ABNORMAL LOW (ref 4.0–10.5)
nRBC: 0 % (ref 0.0–0.2)

## 2022-01-29 LAB — ECHOCARDIOGRAM COMPLETE
Area-P 1/2: 3.42 cm2
Calc EF: 62 %
P 1/2 time: 362 msec
S' Lateral: 2.9 cm
Single Plane A2C EF: 63.7 %
Single Plane A4C EF: 63.1 %

## 2022-01-29 LAB — FERRITIN: Ferritin: 258 ng/mL (ref 24–336)

## 2022-01-29 LAB — LACTATE DEHYDROGENASE: LDH: 186 U/L (ref 98–192)

## 2022-01-29 LAB — RETIC PANEL
Immature Retic Fract: 12.7 % (ref 2.3–15.9)
RBC.: 3.43 MIL/uL — ABNORMAL LOW (ref 4.22–5.81)
Retic Count, Absolute: 88.8 10*3/uL (ref 19.0–186.0)
Retic Ct Pct: 2.6 % (ref 0.4–3.1)
Reticulocyte Hemoglobin: 35.2 pg (ref >27.9)

## 2022-01-29 LAB — IRON AND IRON BINDING CAPACITY (CC-WL,HP ONLY)
Iron: 46 ug/dL (ref 45–182)
Saturation Ratios: 14 % — ABNORMAL LOW (ref 17.9–39.5)
TIBC: 326 ug/dL (ref 250–450)
UIBC: 280 ug/dL (ref 117–376)

## 2022-01-29 LAB — VITAMIN B12: Vitamin B-12: 602 pg/mL (ref 180–914)

## 2022-01-29 NOTE — Progress Notes (Signed)
?[facility]   ?[phone] [phone]   ?Clinic New consult Note  ? ?Patient Care Team: ?[name], FNP as PCP - General (Family Medicine) ?Cira Rue, RN Nurse Navigator as Registered Nurse (Medical Oncology) ?Date of service 01/29/2022 ? ?CHIEF COMPLAINTS/PURPOSE OF CONSULTATION:  ?Anemia, referred by [facility] GI Dr. Alessandra Bevels ? ?HISTORY OF PRESENTING ILLNESS:  ?Cody Gonzalez 76 y.o. male with PMH including HTN, sleep apnea, spinal stenosis, chronic hyponatremia, and prostate cancer in 2021 s/p brachytherapy (Drs. Tresa Moore and Tammi Klippel) is here because of anemia. He was found to have abnormal CBC from 05/24/21 with normocytic anemia Hgb 11.8 during admission for L4-5 decompressive laminectomy and fusion with synovial cyst removal. No history of renal, thyroid, or liver disease. He was referred to GI for EGD/colonoscopy 08/05/21 which showed chronic gastritis, diverticulosis in the sigmoid and descending colon, 1 polyp in the sigmoid colon, petechiae in the rectum, and internal hemorrhoids. He began oral iron once daily at that time (ferrous sulfate 325 mg). He was admitted to [facility] and underwent another spinal surgery on 12/02/21 for spondylolisthesis of lumbar region for degenerative scoliosis and ganglion cyst removal. His H/H dropped to 6.8/19.2% during this admission; he was transfused 2 units pRBCs and hgb improved to 8.2. Platelets temporarily dropped to 142K. He subsequently developed epidural abscess and was readmitted 12/15/21 s/p L2-4 laminectomy and washout. His wife reports he had another blood transfusion during that time, Hgb 8.5 during admission. Wound culture grew Cutinacterium acnes. He was treated with 6 weeks IV vancomycin and ceftriaxone via PICC line per [facility] ID team. He developed hypotension and syncopal episode and was hospitalized locally on 01/03/22, Hgb 8.1 with WBC 3.1 at discharge. He was fluid resuscitated and stabilized, and discharged home on  01/05/22. He completed IV abx 01/28/22 and PICC was pulled yesterday, he was transitioned to oral doxycycline 1 BID x6 weeks starting 3/28. He has had home health labs drawn weekly, 01/13/2022 hemoglobin 9.2, WBC 2.9; 01/16/2022 hemoglobin 10.1, WBC 3.3; 01/20/2022 hemoglobin 10.2, WBC 2.5. ? ?Socially, he is married and lives with his wife.  They moved from [place] in 2019.  He is independent with ADLs.  Denies tobacco or drug use, drinks wine or beer nightly.  His father had glioblastoma, otherwise negative for anemia or cancer.  Reportedly in remission from prostate cancer and up-to-date on other age-appropriate cancer screenings. ? ?Today he presents with his wife, ambulating with a back brace and walker.  He is tired.  He is tolerating oral iron. He has intermittent left leg edema at baseline, wears compression stocking.  He has back discomfort otherwise no pain. Denies black or bloody stools, epistaxis, hematuria, constipation, nausea/vomiting, abdominal pain.  Denies recent fever, chills, night sweats, cough, chest pain, dyspnea. He is not on NSAIDs or anticoagulation. He does not eat red meat.  ? ? ? ?MEDICAL HISTORY:  ?Past Medical History:  ?Diagnosis Date  ? Anemia   ? Hypertension   ? Prostate cancer ([place])   ? Sleep apnea   ? uses cpap   ? TBI (traumatic brain injury) 2015  ? ? ?SURGICAL HISTORY: ?Past Surgical History:  ?Procedure Laterality Date  ? BACK SURGERY    ? BILATERAL CARPAL TUNNEL RELEASE  2014  ? CYSTOSCOPY N/A 06/29/2019  ? Procedure: CYSTOSCOPY;  Surgeon: Alexis Frock, MD;  Location: [facility];  Service: Urology;  Laterality: N/A;  No seeds in bladder per Dr. Tresa Moore  ? LUMBAR LAMINECTOMY/DECOMPRESSION MICRODISCECTOMY Left 05/28/2021  ?  Procedure: Left  - Lumbar five-Sacral one Laminectomy resection of synovial cyst;  Surgeon: Earnie Larsson, MD;  Location: [facility];  Service: Neurosurgery;  Laterality: Left;  ? PROSTATE BIOPSY    ? RADIOACTIVE SEED IMPLANT N/A 06/29/2019  ?  Procedure: RADIOACTIVE SEED IMPLANT/BRACHYTHERAPY IMPLANT;  Surgeon: Alexis Frock, MD;  Location: [facility];  Service: Urology;  Laterality: N/A;  90 MINS  ? SPACE OAR INSTILLATION N/A 06/29/2019  ? Procedure: SPACE OAR INSTILLATION;  Surgeon: Alexis Frock, MD;  Location: [facility];  Service: Urology;  Laterality: N/A;  ? ? ?SOCIAL HISTORY: ?Social History  ? ?Socioeconomic History  ? Marital status: Married  ?  Spouse name: Not on file  ? Number of children: Not on file  ? Years of education: Not on file  ? Highest education level: Not on file  ?Occupational History  ? Occupation: English as a second language teacher  ?  Comment: retired  ?Tobacco Use  ? Smoking status: Never  ? Smokeless tobacco: Never  ?Vaping Use  ? Vaping Use: Never used  ?Substance and Sexual Activity  ? Alcohol use: Yes  ?  Alcohol/week: 2.0 standard drinks  ?  Types: 2 Cans of beer per week  ?  Comment: 2 beers daily  ? Drug use: No  ? Sexual activity: Yes  ?  Comment: with aid of sildenafil  ?Other Topics Concern  ? Not on file  ?Social History Narrative  ? ** Merged History Encounter **  ?    ? Married. 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 Preferred name: Cody Gonzalez. Enjoys cycling.  ? ?Social Determinants of Health  ? ?Financial Resource Strain: Not on file  ?Food Insecurity: Not on file  ?Transportation Needs: Not on file  ?Physical Activity: Not on file  ?Stress: Not on file  ?Social Connections: Not on file  ?Intimate Partner Violence: Not on file  ? ? ?FAMILY HISTORY: ?Family History  ?Problem Relation Age of Onset  ? Brain cancer Father   ?     GBM  ? ? ?ALLERGIES:  is allergic to amlodipine besylate. ? ?MEDICATIONS:  ?Current Outpatient Medications  ?Medication Sig Dispense Refill  ? acetaminophen (TYLENOL) 325 MG tablet Take 2 tablets (650 mg total) by mouth every 6 (six) hours as needed for mild pain (or Fever >/= 101).    ? atorvastatin (LIPITOR) 40 MG tablet Take 40 mg by mouth at  bedtime.    ? b complex vitamins capsule Take 1 capsule by mouth daily.    ? CALCIUM PO Take 1,200 tablets by mouth daily.    ? doxycycline (VIBRAMYCIN) 100 MG capsule Take 100 mg by mouth 2 (two) times daily.    ? ferrous sulfate 325 (65 FE) MG tablet Take 325 mg by mouth at bedtime.    ? Lidocaine 4 % PTCH Apply 2 patches topically daily.    ? Multiple Vitamins-Iron (MULTIVITAMIN/IRON PO) Take 1 tablet by mouth daily with breakfast.    ? telmisartan (MICARDIS) 80 MG tablet Take 1 tablet (80 mg total) by mouth daily. 90 tablet 2  ? vitamin C (ASCORBIC ACID) 500 MG tablet Take 500 mg by mouth daily.    ? zinc gluconate 50 MG tablet Take 50 mg by mouth daily. X 14 days, will end on 01-06-22    ? ?No current facility-administered medications for this visit.  ? ? ?REVIEW OF SYSTEMS:   ?Constitutional: Denies unintentional weight loss, fevers, chills or abnormal night sweats (+) fatigue ?  Eyes: Denies blurriness of vision, double vision or watery eyes ?Ears, nose, mouth, throat, and face: Denies mucositis or sore throat ?Respiratory: Denies cough, dyspnea or wheezes ?Cardiovascular: Denies palpitation, chest discomfort (+) OSA on CPAP (+) intermittent left lower extremity swelling ?Gastrointestinal:  Denies nausea, vomiting, constipation, diarrhea, hematochezia, heartburn or change in bowel habits ?GU: (+) Prostate cancer 2021 s/p radiation ?Skin: Denies abnormal skin rashes or petechiae ?Lymphatics: Denies new lymphadenopathy or easy bruising ?MSK: (+) Multiple back surgeries (+) epidural abscess ?Neurological:Denies numbness, tingling or new weaknesses (+) history of TBI ?Behavioral/Psych: Mood is stable, no new changes  ?All other systems were reviewed with the patient and are negative. ? ?PHYSICAL EXAMINATION: ?ECOG PERFORMANCE STATUS: 1 - Symptomatic but completely ambulatory ? ?Vitals:  ? 01/29/22 1115  ?BP: (!) 170/85  ?Pulse: 76  ?Resp: 18  ?Temp: (!) 97.5 ?F (36.4 ?C)  ?SpO2: 100%  ? ?Filed Weights  ? 01/29/22  1115  ?Weight: 163 lb (73.9 kg)  ? ? ?GENERAL:alert, no distress and comfortable ?SKIN: No rash, ecchymoses, or petechiae ?EYES: sclera clear ?NECK: Mass ?LYMPH:  no palpable cervical or supraclavicular lymphadeno

## 2022-01-30 ENCOUNTER — Encounter: Payer: Self-pay | Admitting: Nurse Practitioner

## 2022-01-30 LAB — FOLATE RBC
Folate, Hemolysate: >620 ng/mL
Folate, RBC: >1920 ng/mL (ref >498)
Hematocrit: 32.3 % — ABNORMAL LOW (ref 37.5–51.0)

## 2022-01-31 LAB — METHYLMALONIC ACID, SERUM: Methylmalonic Acid, Quantitative: 167 nmol/L (ref 0–378)

## 2022-02-03 ENCOUNTER — Telehealth: Payer: Self-pay | Admitting: Pharmacist

## 2022-02-03 LAB — PATHOLOGIST SMEAR REVIEW

## 2022-02-04 NOTE — Telephone Encounter (Signed)
Agree.  ? ?ST

## 2022-02-05 ENCOUNTER — Ambulatory Visit (INDEPENDENT_AMBULATORY_CARE_PROVIDER_SITE_OTHER): Payer: Medicare Other | Admitting: Podiatry

## 2022-02-05 ENCOUNTER — Ambulatory Visit (INDEPENDENT_AMBULATORY_CARE_PROVIDER_SITE_OTHER): Payer: Medicare Other | Admitting: Neurology

## 2022-02-05 ENCOUNTER — Encounter: Payer: Self-pay | Admitting: Podiatry

## 2022-02-05 DIAGNOSIS — B351 Tinea unguium: Secondary | ICD-10-CM

## 2022-02-05 DIAGNOSIS — S069X1S Unspecified intracranial injury with loss of consciousness of 30 minutes or less, sequela: Secondary | ICD-10-CM

## 2022-02-05 DIAGNOSIS — G4730 Sleep apnea, unspecified: Secondary | ICD-10-CM

## 2022-02-05 DIAGNOSIS — G3184 Mild cognitive impairment, so stated: Secondary | ICD-10-CM

## 2022-02-05 DIAGNOSIS — G4761 Periodic limb movement disorder: Secondary | ICD-10-CM

## 2022-02-05 DIAGNOSIS — M79674 Pain in right toe(s): Secondary | ICD-10-CM | POA: Diagnosis not present

## 2022-02-05 DIAGNOSIS — G4739 Other sleep apnea: Secondary | ICD-10-CM

## 2022-02-05 DIAGNOSIS — M79675 Pain in left toe(s): Secondary | ICD-10-CM

## 2022-02-05 DIAGNOSIS — G4733 Obstructive sleep apnea (adult) (pediatric): Secondary | ICD-10-CM

## 2022-02-05 LAB — BASIC METABOLIC PANEL
BUN/Creatinine Ratio: 24 (ref 10–24)
BUN: 16 mg/dL (ref 8–27)
CO2: 18 mmol/L — ABNORMAL LOW (ref 20–29)
Calcium: 9.4 mg/dL (ref 8.6–10.2)
Chloride: 91 mmol/L — ABNORMAL LOW (ref 96–106)
Creatinine, Ser: 0.66 mg/dL — ABNORMAL LOW (ref 0.76–1.27)
Glucose: 96 mg/dL (ref 70–99)
Potassium: 4 mmol/L (ref 3.5–5.2)
Sodium: 128 mmol/L — ABNORMAL LOW (ref 134–144)
eGFR: 97 mL/min/{1.73_m2} (ref >59)

## 2022-02-05 NOTE — Progress Notes (Signed)
This patient returns to the office for evaluation and treatment of long thick painful nails .  This patient is unable to trim his own nails since the patient cannot reach his feet.  Patient says the nails are painful walking and wearing his shoes.  He returns for preventive foot care services.  General Appearance  Alert, conversant and in no acute stress.  Vascular  Dorsalis pedis and posterior tibial  pulses are palpable  bilaterally.  Capillary return is within normal limits  bilaterally. Temperature is within normal limits  bilaterally.  Neurologic  Senn-Weinstein monofilament wire test within normal limits  bilaterally. Muscle power within normal limits bilaterally.  Nails Thick disfigured discolored nails with subungual debris  from hallux to fifth toes bilaterally. No evidence of bacterial infection or drainage bilaterally.  Orthopedic  No limitations of motion  feet .  No crepitus or effusions noted.  No bony pathology or digital deformities noted.  Skin  normotropic skin with no porokeratosis noted bilaterally.  No signs of infections or ulcers noted.     Onychomycosis  Pain in toes right foot  Pain in toes left foot  Debridement  of nails  1-5  B/L with a nail nipper.  Nails were then filed using a dremel tool with no incidents.    RTC  3 months    Jamoni Hewes DPM  

## 2022-02-05 NOTE — Progress Notes (Signed)
Patient been made aware of lab results.  Office not also faxed to PCP, Cody Ledger, NP and requested CBC be checked at their next office visit.  Faxed to [phone]. No further concerns at this time.   ?

## 2022-02-10 ENCOUNTER — Emergency Department (HOSPITAL_COMMUNITY): Payer: Medicare Other

## 2022-02-10 ENCOUNTER — Encounter (HOSPITAL_COMMUNITY): Payer: Self-pay | Admitting: Emergency Medicine

## 2022-02-10 ENCOUNTER — Other Ambulatory Visit: Payer: Self-pay | Admitting: Physician Assistant

## 2022-02-10 ENCOUNTER — Emergency Department (HOSPITAL_COMMUNITY)
Admission: EM | Admit: 2022-02-10 | Discharge: 2022-02-11 | Disposition: A | Discharge status: Home / Self Care | Payer: Medicare Other | Attending: Emergency Medicine | Admitting: Emergency Medicine

## 2022-02-10 ENCOUNTER — Other Ambulatory Visit: Payer: Self-pay

## 2022-02-10 DIAGNOSIS — S32018A Other fracture of first lumbar vertebra, initial encounter for closed fracture: Secondary | ICD-10-CM | POA: Insufficient documentation

## 2022-02-10 DIAGNOSIS — M4326 Fusion of spine, lumbar region: Secondary | ICD-10-CM

## 2022-02-10 DIAGNOSIS — S3992XA Unspecified injury of lower back, initial encounter: Secondary | ICD-10-CM | POA: Diagnosis present

## 2022-02-10 DIAGNOSIS — G039 Meningitis, unspecified: Secondary | ICD-10-CM | POA: Diagnosis not present

## 2022-02-10 DIAGNOSIS — X58XXXA Exposure to other specified factors, initial encounter: Secondary | ICD-10-CM | POA: Diagnosis not present

## 2022-02-10 DIAGNOSIS — S32010A Wedge compression fracture of first lumbar vertebra, initial encounter for closed fracture: Secondary | ICD-10-CM

## 2022-02-10 LAB — CBC WITH DIFFERENTIAL/PLATELET
Abs Immature Granulocytes: 0.02 10*3/uL (ref 0.00–0.07)
Basophils Absolute: 0 10*3/uL (ref 0.0–0.1)
Basophils Relative: 0 %
Eosinophils Absolute: 0.1 10*3/uL (ref 0.0–0.5)
Eosinophils Relative: 1 %
HCT: 33.4 % — ABNORMAL LOW (ref 39.0–52.0)
Hemoglobin: 10.9 g/dL — ABNORMAL LOW (ref 13.0–17.0)
Immature Granulocytes: 0 %
Lymphocytes Relative: 10 %
Lymphs Abs: 0.5 10*3/uL — ABNORMAL LOW (ref 0.7–4.0)
MCH: 31.3 pg (ref 26.0–34.0)
MCHC: 32.6 g/dL (ref 30.0–36.0)
MCV: 96 fL (ref 80.0–100.0)
Monocytes Absolute: 0.6 10*3/uL (ref 0.1–1.0)
Monocytes Relative: 12 %
Neutro Abs: 3.6 10*3/uL (ref 1.7–7.7)
Neutrophils Relative %: 77 %
Platelets: 296 10*3/uL (ref 150–400)
RBC: 3.48 MIL/uL — ABNORMAL LOW (ref 4.22–5.81)
RDW: 13.8 % (ref 11.5–15.5)
WBC: 4.8 10*3/uL (ref 4.0–10.5)
nRBC: 0 % (ref 0.0–0.2)

## 2022-02-10 LAB — BASIC METABOLIC PANEL
Anion gap: 11 (ref 5–15)
BUN: 16 mg/dL (ref 8–23)
CO2: 21 mmol/L — ABNORMAL LOW (ref 22–32)
Calcium: 8.8 mg/dL — ABNORMAL LOW (ref 8.9–10.3)
Chloride: 95 mmol/L — ABNORMAL LOW (ref 98–111)
Creatinine, Ser: 0.72 mg/dL (ref 0.61–1.24)
GFR, Estimated: >60 mL/min (ref >60)
Glucose, Bld: 110 mg/dL — ABNORMAL HIGH (ref 70–99)
Potassium: 4.7 mmol/L (ref 3.5–5.1)
Sodium: 127 mmol/L — ABNORMAL LOW (ref 135–145)

## 2022-02-10 IMAGING — MR MR LUMBAR SPINE WO/W CM
4 of 7 series · 24 of 48 positions shown · IV contrast (gadavist)
Comparison: Comparison made with previous MRI from [DATE] as
well as prior CT from earlier the same day.

CLINICAL DATA: Initial evaluation for acute low back pain, history
of prior surgery and epidural abscess, with worsened pain.

EXAM:
MRI LUMBAR SPINE WITHOUT AND WITH CONTRAST
TECHNIQUE: Multiplanar and multiecho pulse sequences of the lumbar spine were
obtained without and with intravenous contrast.
CONTRAST:  7mL GADAVIST GADOBUTROL 1 MMOL/ML IV SOLN

[Series 5: T2 · sagittal · 4.0mm · 0.73mm/px · 6 of 18 slices shown (1 of 2)]
[im 1/18]
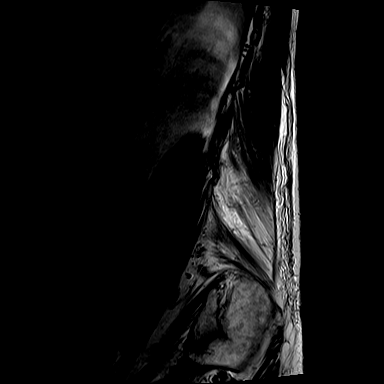
[im 4/18]
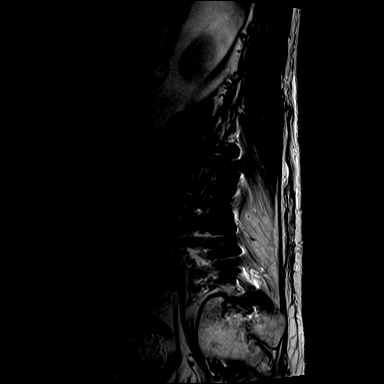
[im 7/18]
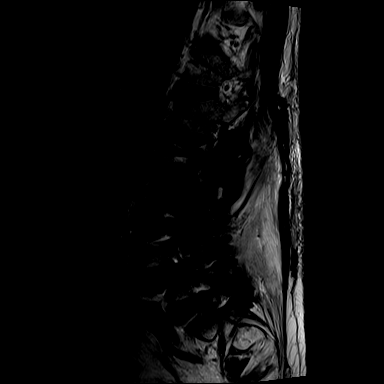
[im 11/18]
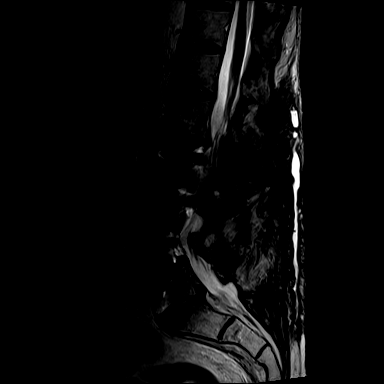
[im 14/18]
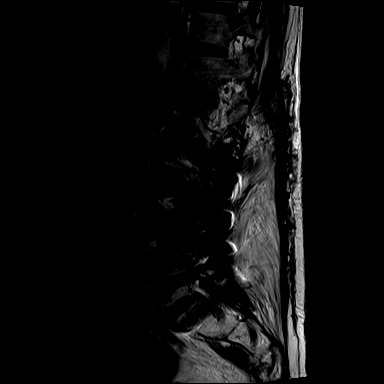
[im 18/18]
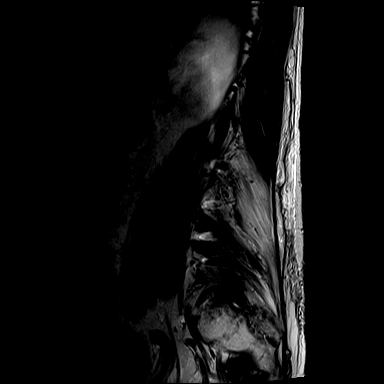

[Series 7: T1 · sagittal · 4.0mm · 0.88mm/px · 5 of 18 slices shown (1 of 2)]
[im 1/18]
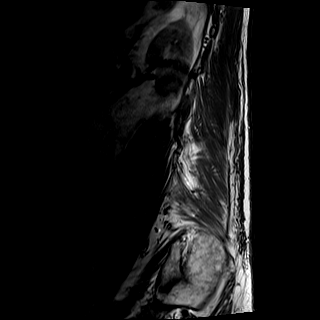
[im 5/18]
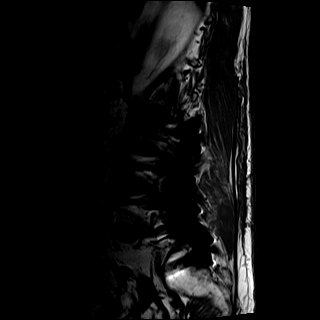
[im 9/18]
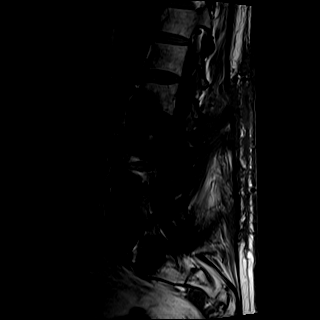
[im 13/18]
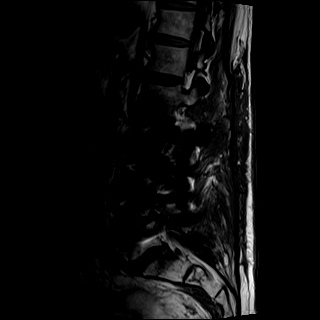
[im 18/18]
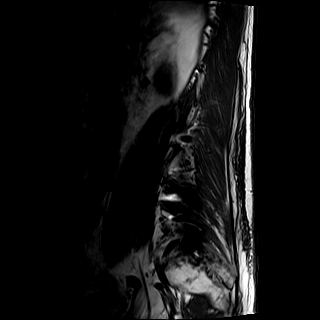

[Series 8: T2 · axial · 4.0mm · 0.57mm/px · z∈[-76,+140]mm · 9 of 34 slices shown (2 of 2)]
[im 1/34]
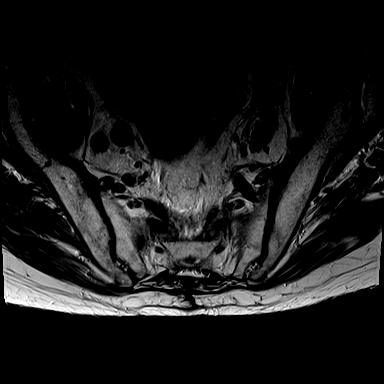
[im 5/34]
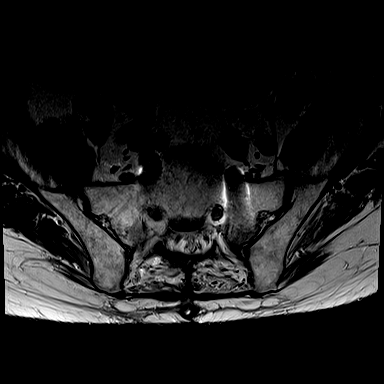
[im 9/34]
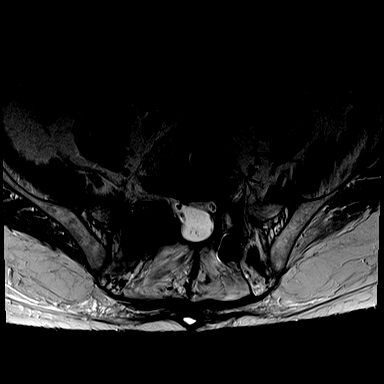
[im 13/34]
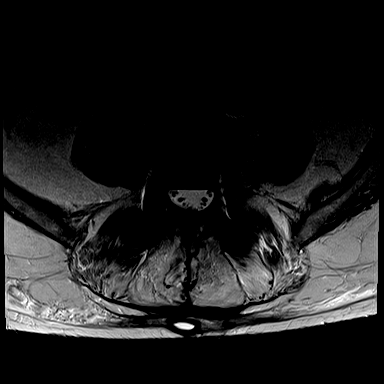
[im 17/34]
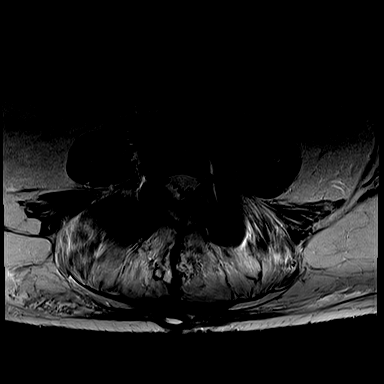
[im 21/34]
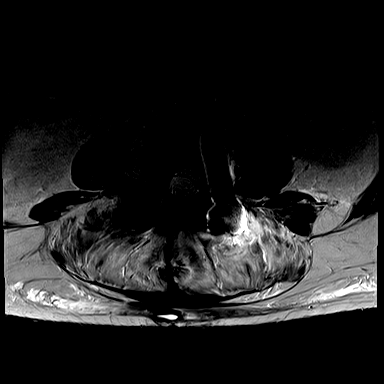
[im 25/34]
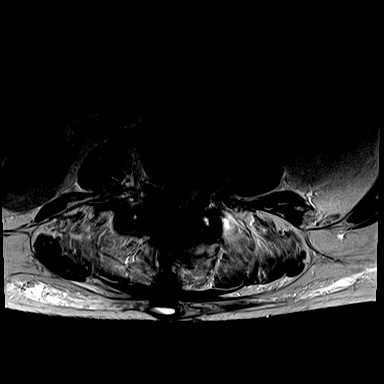
[im 29/34]
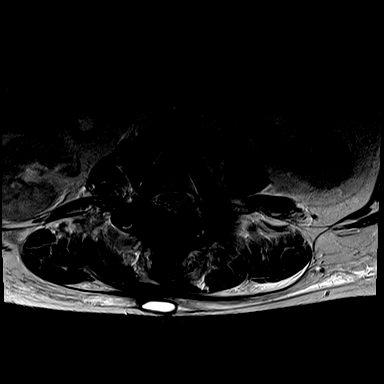
[im 34/34]
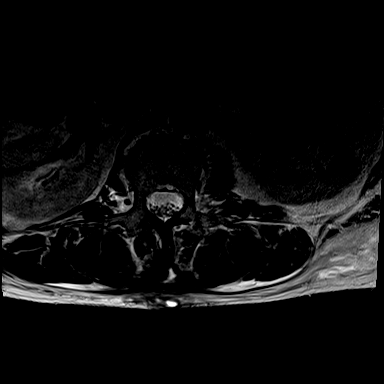

[Series 9: T1 · axial · 4.0mm · 0.34mm/px · z∈[-76,+115]mm · 4 of 34 slices shown (2 of 2)]
[im 1/34]
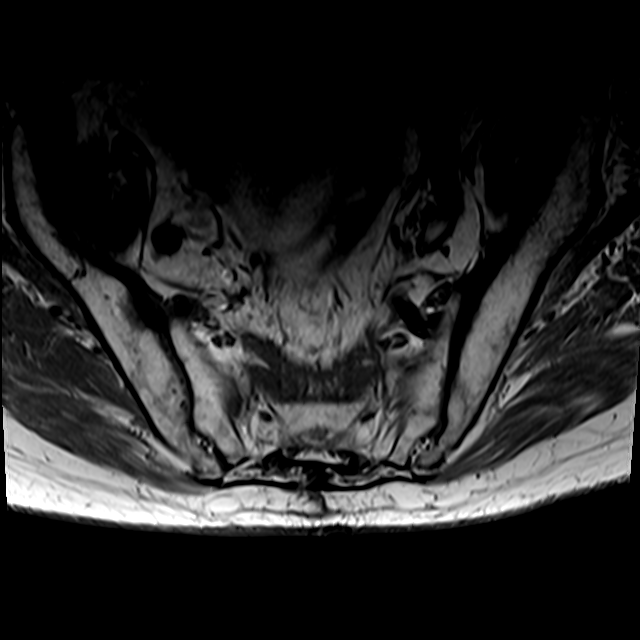
[im 5/34]
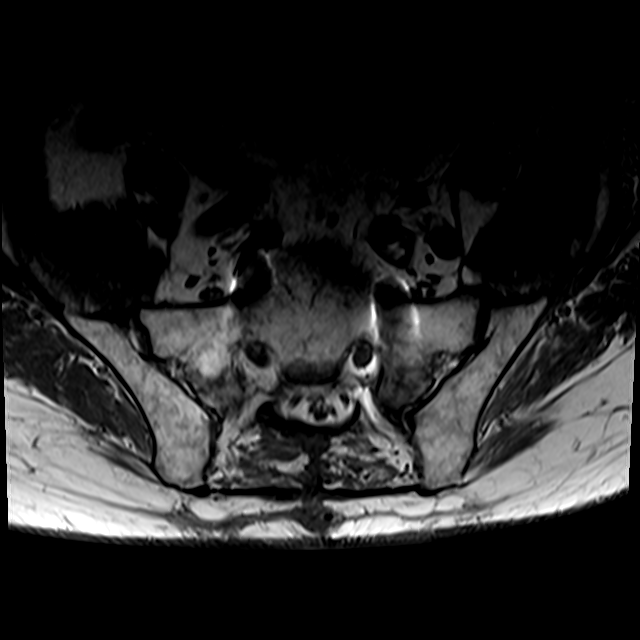
[im 17/34]
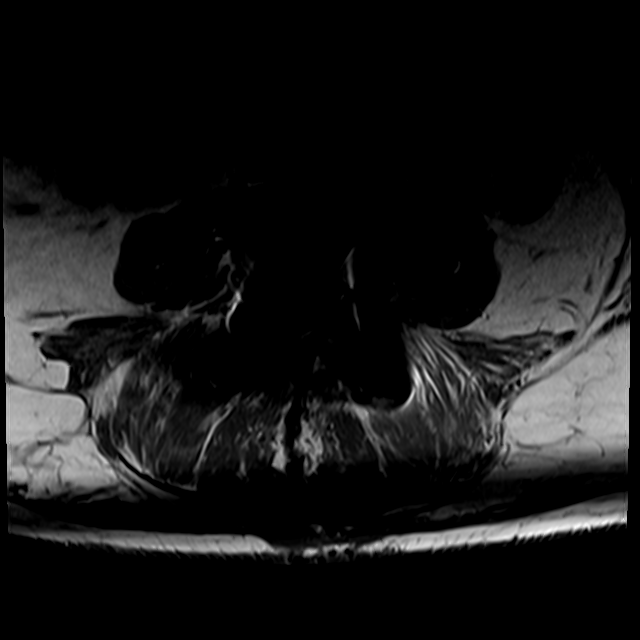
[im 29/34]
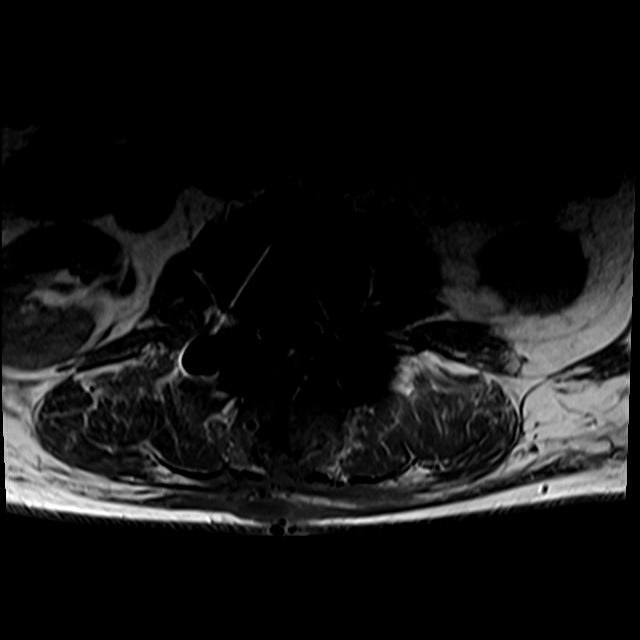

[24 of 48 positions shown; findings below may reference images not displayed]

FINDINGS: Segmentation: Standard. Lowest well-formed disc space labeled the
L5-S1 level.

Alignment: Stable alignment with underlying mild levoscoliosis.
Trace retrolisthesis of L2 on L3. 4 mm anterolisthesis of L4 on L5,
with 3 mm anterolisthesis of L5 on S1.

Vertebrae: Susceptibility artifact from prior posterior fusion at L2
through the sacrum again seen. Hardware better evaluated on prior
CT. Patient also status post interbody fusion at L3-4 through L5-S1.
previously identified compression fracture involving the superior
endplate of L2 noted, better appreciated on prior CT. There is an
additional subtle fracture extending through the adjacent inferior
endplate of L1 with associated marrow edema, but no significant
height loss, also stable from prior CT in retrospect, although he is
here to see on this exam. Otherwise, vertebral body height
maintained. Underlying bone marrow signal intensity within normal
limits. No worrisome osseous lesions. No MRI findings of
osteomyelitis discitis or septic arthritis.

Conus medullaris and cauda equina: Conus extends to the L1 level.
Conus medullaris within normal limits. Mild clumping of the nerve
roots of the cauda equina at the level of L5, likely reflecting a
degree of arachnoiditis (series 8, image 24). Previously seen
epidural/intradural collection has largely resolved status post
surgical evacuation. Possible small residual component within the
dorsal epidural space at the level of L1-2 measures 6 x 10 mm
(series 8, image 4).

Paraspinal and other soft tissues: Extensive postoperative changes
seen throughout the posterior paraspinous soft tissues. Small
residual collection at the laminectomy site of L2-3 measures 2.6 x
0.9 x 1.0 cm. Additional small collection within the subcutaneous
fat along the lower midline incision measures approximately 1.9 x
0.6 cm (series 8, image 7). Findings felt to be most consistent with
benign postoperative seromas. These are favored to reflect
benign/sterile postoperative collections. Previously seen small
collection within the right psoas muscle no longer visualized.
Visualized visceral structures within normal limits.

Disc levels:

L1-2: Mild disc bulge with disc desiccation. Moderate bilateral
facet hypertrophy with associated small joint effusions. Probable
small residual epidural collection measuring up to 10 mm (series 8,
image 4). Mild spinal stenosis. Foramina remain patent.

L2-3: Diffuse disc bulge with disc desiccation. Prior posterior
decompression with fusion with residual postoperative collection as
above. Residual bilateral facet hypertrophy. No more than residual
mild spinal stenosis. Foramina remain patent.

L3-4: Prior posterior and interbody fusion with posterior
decompression. Left-sided facet hypertrophy. Residual mild narrowing
of the left lateral recess. Central canal remains widely patent.
Severe right L3 foraminal stenosis, similar. Right neural foramen is
grossly patent.

L4-5: Anterolisthesis. Prior posterior and interbody fusion with
posterior decompression. No residual spinal stenosis residual mild
left greater than right L4 foraminal narrowing.

L5-S1: Prior posterior and interbody fusion with posterior
decompression. Residual broad based posterior disc bulging. No
significant canal or lateral recess stenosis. Foramina appear
grossly patent.
IMPRESSION: 1. Interval near complete resolution of previously seen
epidural/intradural collection status post surgical evacuation.
Probable small residual component within the dorsal epidural space
at the level of L1-2. No more than mild residual canal and lateral
recess stenosis at the levels of L1-2 through L3-4 as above.
2. Extensive postoperative changes throughout the posterior
paraspinous soft tissues with small residual collections at the
laminectomy site of L2-3 and along the lower midline incision. These
are favored to reflect benign/sterile postoperative collections,
although correlation with physical exam is recommended. No other
evidence for new or progressive infection elsewhere within the
lumbar spine.
3. Compression fractures involving the inferior endplate of L1 and
superior endplate of L2, better seen on prior CT performed earlier
the same day.
4. Mild clumping of the nerve roots of the cauda equina at the level
of L5, suggesting arachnoiditis.

## 2022-02-10 IMAGING — CT CT L SPINE W/O CM
4 of 6 series · 12 of 34 positions shown, 13 images · non-contrast
Comparison: Multiple recent CT and radiography studies.

CLINICAL DATA: Low back pain.  Recent surgery.



[Series 5: l spine soft (person_name) · axial · 0.32mm/px · z∈[+1096,+1166]mm · 3 of 158 slices shown]
[im 18/158  soft-tissue]
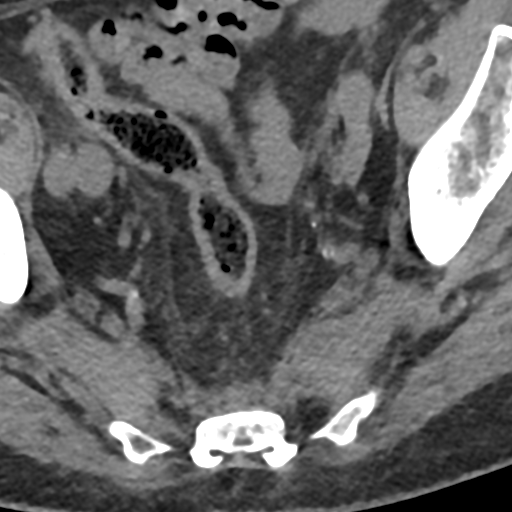
[im 35/158  soft-tissue]
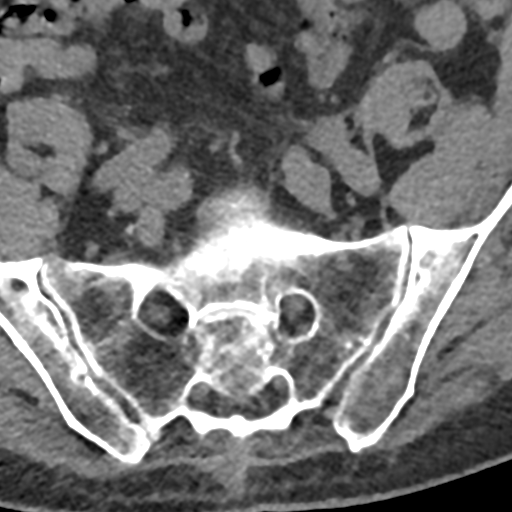
[im 53/158  soft-tissue]
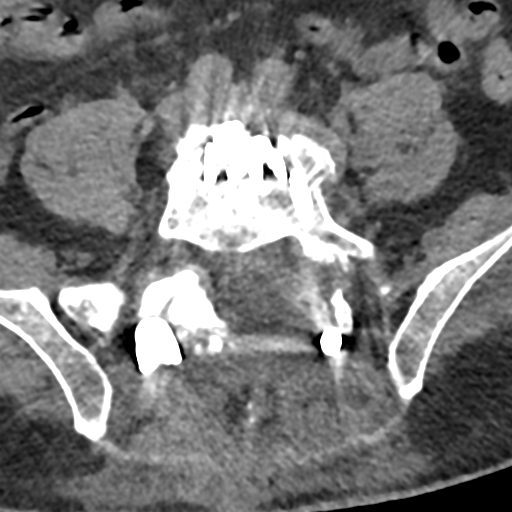

[Series 6: sag bone (person_name) · sagittal · 0.31mm/px · 6 of 88 slices shown]
[im 3/88  soft-tissue]
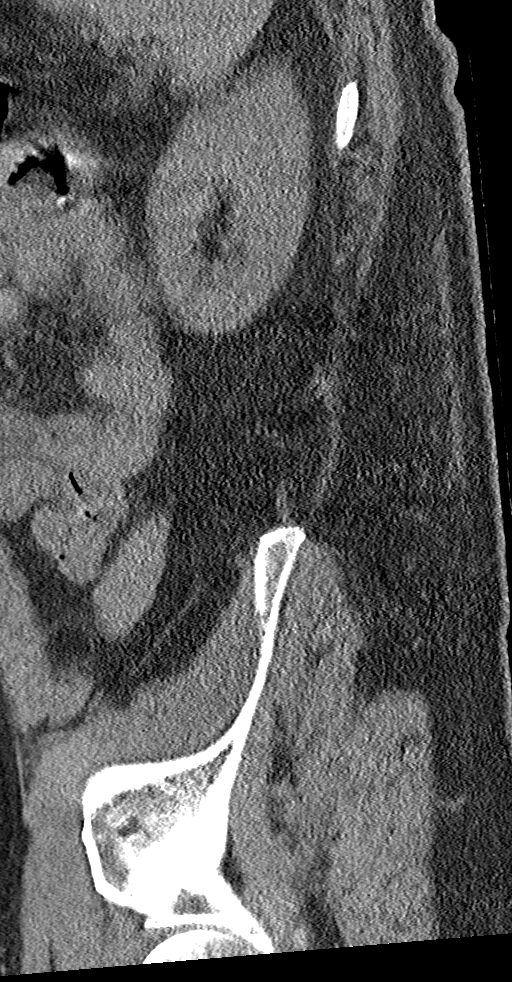
[im 15/88  bone]
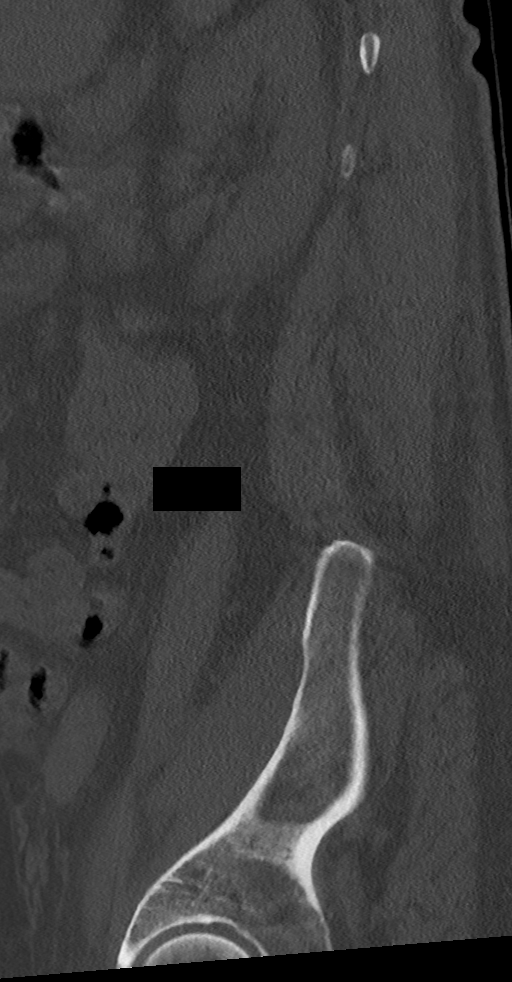
[im 30/88  bone]
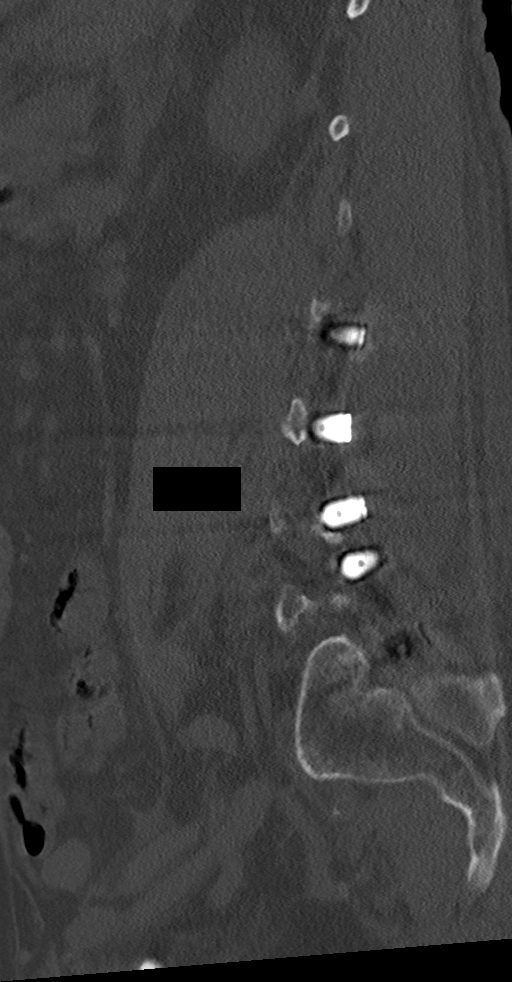
[im 44/88  bone]
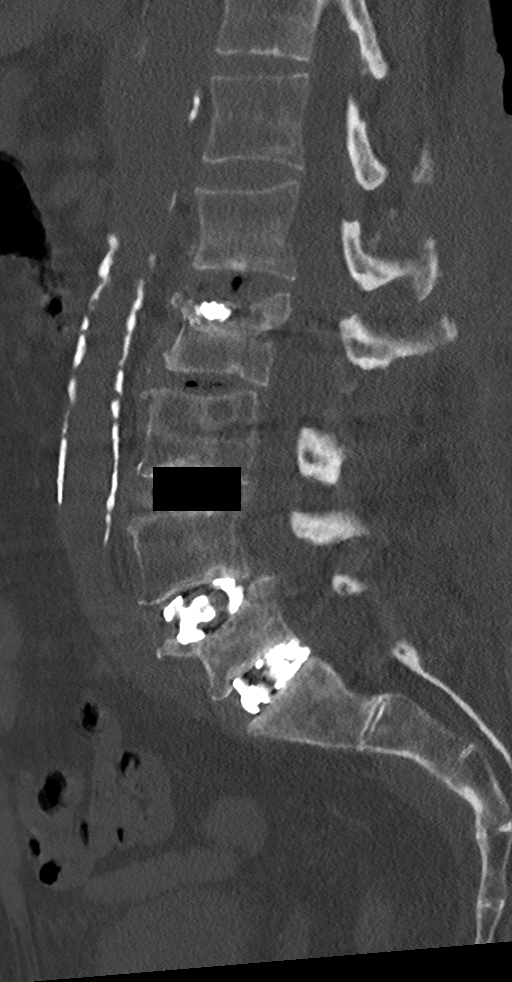
[im 59/88  bone]
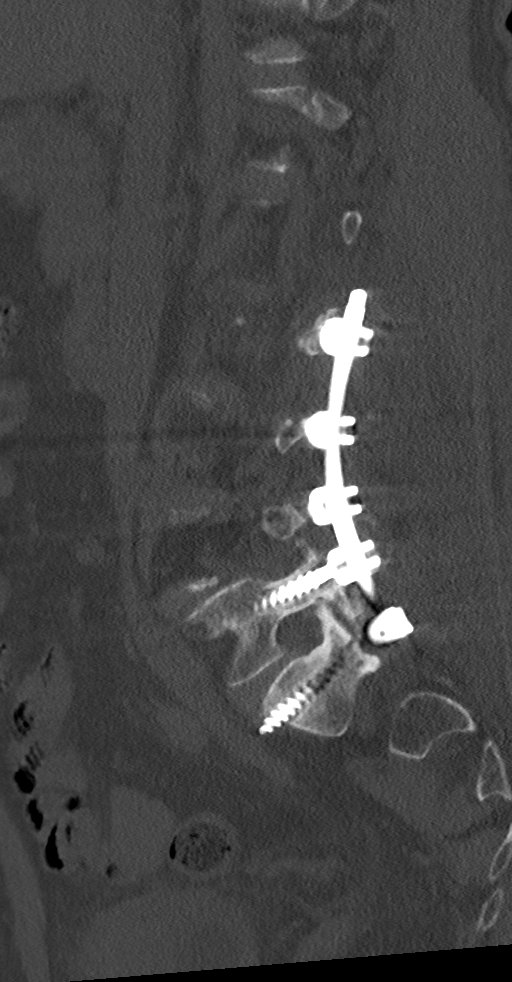
[im 73/88  bone]
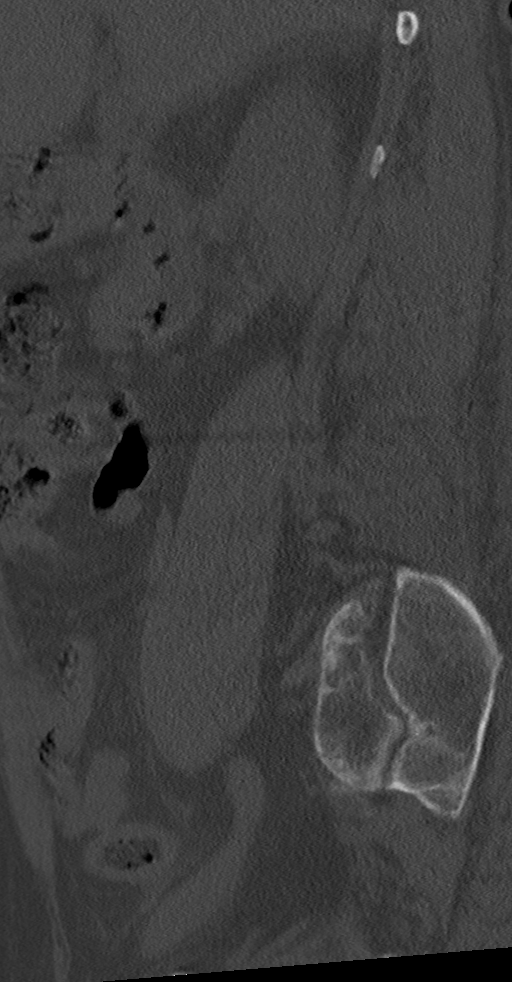

[Series 7: cor bone · coronal · 0.37mm/px · 1 of 81 slices shown]
[im 41/81  bone]
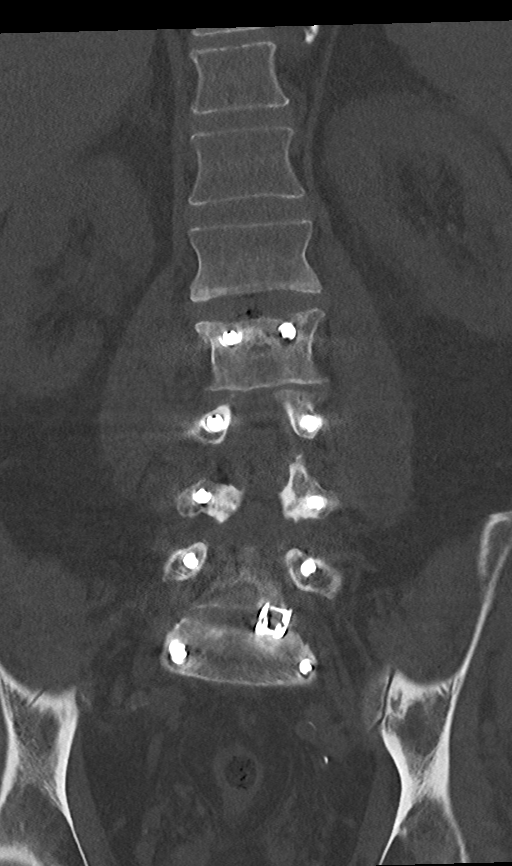

[Series 101: lumbar spine rtd · axial · 0.98mm/px · z∈[+1164,+1270]mm · 2 of 63 slices shown, 3 images]
[im 21/63  soft-tissue]
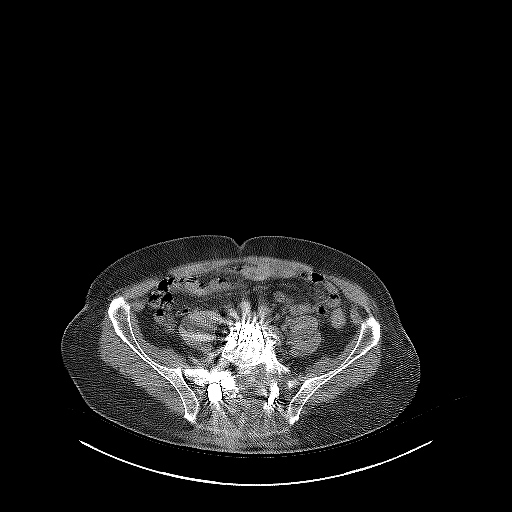
[im 21/63  bone]
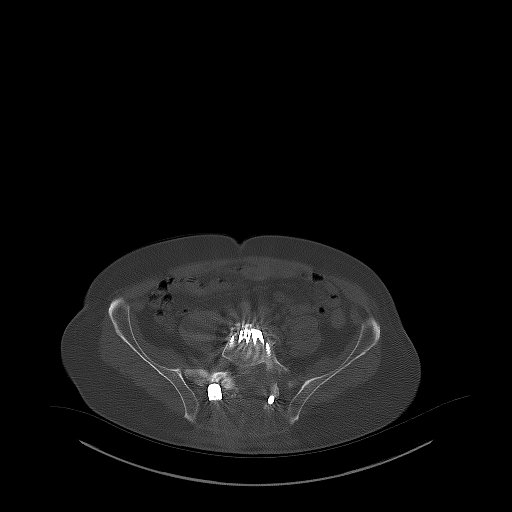
[im 42/63  bone]
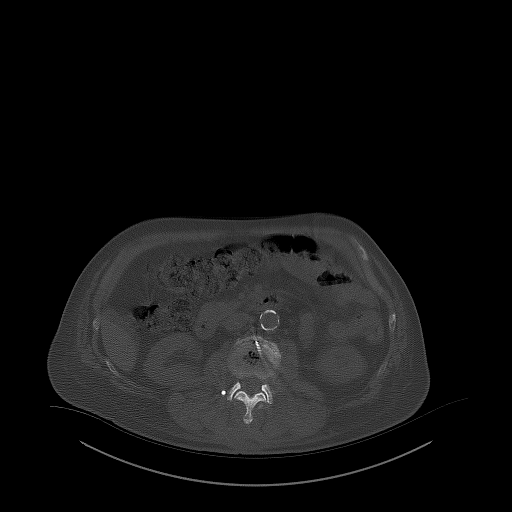

[12 of 34 positions shown; findings below may reference images not displayed]

FINDINGS: Segmentation: 5 lumbar type vertebral bodies as numbered previously.

Alignment: Mild scoliotic curvature convex to the left. Fixed
anterolisthesis at L4-5 of 3 mm.

Vertebrae: Minor inferior endplate fracture at L1.

Superior endplate fracture at L2. Fracture extends into the left
pedicle. Left pedicle screw breaches the superior endplate of the
vertebral body.

Subsidence at L4-5 with the interbody spacer subsided into the
superior aspect of L5.

Lucency around the S1 screws consistent with motion.

Paraspinal and other soft tissues: Negative

Disc levels: L1-2: Inferior endplate fracture L1 and superior
endplate fracture L2 as noted above. No compressive canal or
foraminal stenosis.

L2-3: Fusion and posterior decompression. Sufficient patency of the
canal and foramina.

L3-4: Fusion and posterior decompression. Sufficient patency of the
canal and foramina.

L4-5: Endplate subsidence, particularly at the superior endplate of
L5 as noted above. Sufficient patency of the canal and foramina.

L5-S1: Fusion and posterior decompression. Sufficient patency of the
canal and foramina. Screw loosening at S1 indicating some degree of
motion.
IMPRESSION: Inferior endplate compression fracture L1 with loss of height of
only about 10%.

Superior endplate fracture at L2 with loss of height of 10-20%.
Fracture line extends into the left pedicle. Left pedicle screw
breaches the superior endplate of the vertebral body.

Superior endplate subsidence at L5 with loss of height.

Lucency around the screws at S1 consistent with motion.

## 2022-02-10 MED ORDER — GADOBUTROL 1 MMOL/ML IV SOLN
7.0000 mL | Freq: Once | INTRAVENOUS | Status: AC | PRN
  Administered 2022-02-10: 7 mL via INTRAVENOUS

## 2022-02-10 MED ORDER — OXYCODONE HCL 5 MG PO TABS
5.0000 mg | ORAL_TABLET | Freq: Once | ORAL | Status: AC
  Administered 2022-02-10: 5 mg via ORAL
  Filled 2022-02-10: qty 1

## 2022-02-10 MED ORDER — OXYCODONE HCL 5 MG PO TABS
5.0000 mg | ORAL_TABLET | Freq: Once | ORAL | Status: AC
  Administered 2022-02-11: 5 mg via ORAL
  Filled 2022-02-10: qty 1

## 2022-02-10 NOTE — ED Notes (Signed)
Pt transported to MRI 

## 2022-02-10 NOTE — Discharge Instructions (Signed)
You are seen in the ER for back pain and the MRI here shows that you have a new compression fracture in the upper lumbar spine region and also some inflammation of membrane surrounding your spinal cord ? ?We were able to get in touch with neurosurgery team at [facility]. ? ?They will notify Cody Gonzalez tomorrow and will get in touch with you.  For now they recommend no additional treatment for the MRI findings and just focus on your pain control.  They will try to see if your appointment can be moved up. ?

## 2022-02-10 NOTE — ED Provider Notes (Signed)
?[facility] ?Provider Note ? ? ?CSN: [id number] ?Arrival date & time: 02/10/22  1235 ? ?  ? ?History ? ?Chief Complaint  ?Patient presents with  ? Back Pain  ? ? ?Cody Gonzalez is a 76 y.o. male. ? ?HPI ? ?  ? ?Cody Gonzalez - 76 year old male presenting today for lower back pain. [name] PMH include a history of prior L4-5 fusion (05/2021),  L2-S1 PSF with total lumbar fixation at L3 / L4 and L5/S1 (12/02/21), epidural abscess (12/15/21) from T11 - L4 with spinal compression that was treated with IV ceftriaxone and vancomycin that ended 01/27/22 and is now taking p.o. Doxycycline for 6 weeks, BPH, prostate cancer that is in remission, and has had a foley catheter in since 02/03/22 for urinary retention for "a couple of weeks". Pt states that he had "increasing lower midline back pain / aching since Friday." He states that his back pain is between a 5 - 7 with "slight relief with Tylenol". Friday, pt went to [facility] for post - op neurosurgery and met with the PA who told him that if back pain increased over the weekend to come in the ER and since the pain has increased, he decided to come in. Pt states that he has felt like "he has not been able to relieve himself using the restroom", so he called his urologist and was seen last week. Per patient and wife, the urologist did a bladder scan which showed a full bladder so a foley catheter was inserted. Pt denies bladder incontinence, bowel incontinence, saddle anesthesia, fever, night sweats, weight loss, numbness in upper and lower extremities, motor loss in upper and lower extremities, chest pain, or SOB.  ? ? ?Home Medications ?Prior to Admission medications   ?Medication Sig Start Date End Date Taking? Authorizing Provider  ?acetaminophen (TYLENOL) 325 MG tablet Take 2 tablets (650 mg total) by mouth every 6 (six) hours as needed for mild pain (or Fever >/= 101). 01/05/22   Nolberto Hanlon, MD  ?atorvastatin (LIPITOR) 40 MG tablet  Take 40 mg by mouth at bedtime. 09/01/19   [provider]  ?b complex vitamins capsule Take 1 capsule by mouth daily.    [provider]  ?CALCIUM PO Take 1,200 tablets by mouth daily.    [provider]  ?doxycycline (VIBRAMYCIN) 100 MG capsule Take 100 mg by mouth 2 (two) times daily.    [provider]  ?ferrous sulfate 325 (65 FE) MG tablet Take 325 mg by mouth at bedtime.    [provider]  ?Lidocaine 4 % PTCH Apply 2 patches topically daily.    [provider]  ?Multiple Vitamins-Iron (MULTIVITAMIN/IRON PO) Take 1 tablet by mouth daily with breakfast.    [provider]  ?NIFEdipine (ADALAT CC) 60 MG 24 hr tablet Take 30 mg by mouth daily.    [provider]  ?telmisartan (MICARDIS) 80 MG tablet Take 1 tablet (80 mg total) by mouth daily. 01/27/22   Tolia, Sunit, DO  ?vitamin C (ASCORBIC ACID) 500 MG tablet Take 500 mg by mouth daily.    [provider]  ?zinc gluconate 50 MG tablet Take 50 mg by mouth daily. X 14 days, will end on 01-06-22    [provider]  ?   ? ?Allergies    ?Amlodipine besylate   ? ?Review of Systems   ?Review of Systems  ?All other systems reviewed and are negative. ? ?Physical Exam ?Updated Vital Signs ?  BP (!) 163/79 (BP Location: Right Arm)   Pulse 72   Temp 97.9 ?F (36.6 ?C) (Oral)   Resp 17   SpO2 100%  ?Physical Exam ?Vitals and nursing note reviewed.  ?Constitutional:   ?   Appearance: He is well-developed.  ?HENT:  ?   Head: Atraumatic.  ?Eyes:  ?   Extraocular Movements: Extraocular movements intact.  ?   Pupils: Pupils are equal, round, and reactive to light.  ?Cardiovascular:  ?   Rate and Rhythm: Normal rate.  ?Pulmonary:  ?   Effort: Pulmonary effort is normal.  ?Musculoskeletal:  ?   Cervical back: Neck supple.  ?Skin: ?   General: Skin is warm.  ?Neurological:  ?   Mental Status: He is alert and oriented to person, place, and time.  ?   Comments: Patient has 2+ patellar reflex  bilaterally, gross sensory exam for the lower extremity is normal, motor strength is 4+ out of 5 bilaterally  ? ? ?ED Results / Procedures / Treatments   ?Labs ?(all labs ordered are listed, but only abnormal results are displayed) ?Labs Reviewed  ?CBC WITH DIFFERENTIAL/PLATELET - Abnormal; Notable for the following components:  ?    Result Value  ? RBC 3.48 (*)   ? Hemoglobin 10.9 (*)   ? HCT 33.4 (*)   ? Lymphs Abs 0.5 (*)   ? All other components within normal limits  ?BASIC METABOLIC PANEL - Abnormal; Notable for the following components:  ? Sodium 127 (*)   ? Chloride 95 (*)   ? CO2 21 (*)   ? Glucose, Bld 110 (*)   ? Calcium 8.8 (*)   ? All other components within normal limits  ? ? ?EKG ?None ? ?Radiology ?CT Lumbar Spine Wo Contrast ? ?Result Date: 02/10/2022 ?CLINICAL DATA:  Low back pain.  Recent surgery. EXAM: CT LUMBAR SPINE WITHOUT CONTRAST TECHNIQUE: Multidetector CT imaging of the lumbar spine was performed without intravenous contrast administration. Multiplanar CT image reconstructions were also generated. RADIATION DOSE REDUCTION: This exam was performed according to the departmental dose-optimization program which includes automated exposure control, adjustment of the mA and/or kV according to patient size and/or use of iterative reconstruction technique. COMPARISON:  Multiple recent CT and radiography studies. FINDINGS: Segmentation: 5 lumbar type vertebral bodies as numbered previously. Alignment: Mild scoliotic curvature convex to the left. Fixed anterolisthesis at L4-5 of 3 mm. Vertebrae: Minor inferior endplate fracture at L1. Superior endplate fracture at L2. Fracture extends into the left pedicle. Left pedicle screw breaches the superior endplate of the vertebral body. Subsidence at L4-5 with the interbody spacer subsided into the superior aspect of L5. Lucency around the S1 screws consistent with motion. Paraspinal and other soft tissues: Negative Disc levels: L1-2: Inferior endplate  fracture L1 and superior endplate fracture L2 as noted above. No compressive canal or foraminal stenosis. L2-3: Fusion and posterior decompression. Sufficient patency of the canal and foramina. L3-4: Fusion and posterior decompression. Sufficient patency of the canal and foramina. L4-5: Endplate subsidence, particularly at the superior endplate of L5 as noted above. Sufficient patency of the canal and foramina. L5-S1: Fusion and posterior decompression. Sufficient patency of the canal and foramina. Screw loosening at S1 indicating some degree of motion. IMPRESSION: Inferior endplate compression fracture L1 with loss of height of only about 10%. Superior endplate fracture at L2 with loss of height of 10-20%. Fracture line extends into the left pedicle. Left pedicle screw breaches the superior endplate of the vertebral body. Superior endplate  subsidence at L5 with loss of height. Lucency around the screws at S1 consistent with motion. Electronically Signed   By: Nelson Chimes M.D.   On: 02/10/2022 14:52  ? ?MR Lumbar Spine W Wo Contrast ? ?Result Date: 02/10/2022 ?CLINICAL DATA:  Initial evaluation for acute low back pain, history of prior surgery and epidural abscess, with worsened pain. EXAM: MRI LUMBAR SPINE WITHOUT AND WITH CONTRAST TECHNIQUE: Multiplanar and multiecho pulse sequences of the lumbar spine were obtained without and with intravenous contrast. CONTRAST:  42m GADAVIST GADOBUTROL 1 MMOL/ML IV SOLN COMPARISON:  Comparison made with previous MRI from 12/14/2021 as well as prior CT from earlier the same day. FINDINGS: Segmentation: Standard. Lowest well-formed disc space labeled the L5-S1 level. Alignment: Stable alignment with underlying mild levoscoliosis. Trace retrolisthesis of L2 on L3. 4 mm anterolisthesis of L4 on L5, with 3 mm anterolisthesis of L5 on S1. Vertebrae: Susceptibility artifact from prior posterior fusion at L2 through the sacrum again seen. Hardware better evaluated on prior CT. Patient  also status post interbody fusion at L3-4 through L5-S1. previously identified compression fracture involving the superior endplate of L2 noted, better appreciated on prior CT. There is an additional subtle fractur

## 2022-02-10 NOTE — ED Triage Notes (Signed)
Patient coming from home, complaint of back pain that started Friday, states had back surgery at [name]. ?

## 2022-02-10 NOTE — ED Provider Triage Note (Signed)
Emergency Medicine Provider Triage Evaluation Note ? ?Elmarie Mainland , a 76 y.o. male  was evaluated in triage.  Pt complains of gradual onset, constant, sharp, lower back pain that began on Friday. Pt with hx of L3-4, L5-S1 TLIF, removal prior instrumentation L4-5, L2-S1 PSF; L2-4 laminectomy for epidural abscess (Dr. Tamala Julian) 1/30 and 2/3 at [name]. He had a 6 month follow up last week however in the morning prior to his visit he began having worsening pain.  He had ordered a CT L-spine without an MRI L-spine without however states he has not heard back from [facility] imaging and was told that his pain got any worse that he should come to the ED for further eval.  He states that pain is worsened over the weekend prompting ED visit today. ? ?Patient also mentions he has a Foley catheter in place at this time.  He states that he was having urinary retention last week and was seen at urology office and had retention, Foley cath in place.  He was supposed to have removed today however they were unable to schedule an appointment in the office.  ? ?He is currently on antibiotics due to epidural abscess.  Wife reports that he was on vancomycin and Rocephin for 6 weeks and currently on doxycycline x6 weeks.  He is currently on the second week of doxycycline. ? ?Review of Systems  ?Positive: + back pain ?Negative: - weakness/numbness to legs, fevers, chills ? ?Physical Exam  ?BP (!) 151/62   Pulse 87   Temp 97.9 ?F (36.6 ?C) (Oral)   Resp 18   SpO2 100%  ?Gen:   Awake, no distress   ?Resp:  Normal effort  ?MSK:   Moves extremities without difficulty  ?Other:   ? ?Medical Decision Making  ?Medically screening exam initiated at 12:54 PM.  Appropriate orders placed.  Jossue A Pyatt was informed that the remainder of the evaluation will be completed by another provider, this initial triage assessment does not replace that evaluation, and the importance of remaining in the ED until their evaluation is  complete. ? ? ?  ?Eustaquio Maize, PA-C ?02/10/22 1258 ? ?

## 2022-02-11 DIAGNOSIS — S32018A Other fracture of first lumbar vertebra, initial encounter for closed fracture: Secondary | ICD-10-CM | POA: Diagnosis not present

## 2022-02-11 DIAGNOSIS — G3184 Mild cognitive impairment, so stated: Secondary | ICD-10-CM | POA: Insufficient documentation

## 2022-02-11 NOTE — Addendum Note (Signed)
Addended by: Larey Seat on: 02/11/2022 05:27 PM ? ? Modules accepted: Orders ? ?

## 2022-02-11 NOTE — Procedures (Signed)
PATIENT'S NAME:  Cody Gonzalez, Cody A. ?DOB:      13-Sep-1946      ?MR#:    [id number]     ?DATE OF RECORDING: 02/05/2022 Carmelina Dane ?REFERRING M.D.:  Jillyn Ledger, NP  ?Study Performed:   CPAP  Titration ?HISTORY:  Mr. Lukasik has been originally a patient referred with a TBI and sleep apnea- followed in [place]  sleep clinic he did well on CPAP, with a residual AHI of 9/h.  He had surgical complications after spine surgery, infection, on vancomycin.  He was not able to use his CPAP for a while with multiple hospitalizations, surgical protocols etc.  He just resumed auto CPAP in early March 2023 and I have 8 days of data available here- ? the average CPAP pressure was 11.3 cmH2O the 90th percentile pressure was 15 cmH2O and he did have several episodes of periodic breathing at night- also known as Cheyne-Stokes respiration. He is no longer on opiate pain medication. No high air leak was noted. Good mask fit. It seems he has central apneas and should be investigated in an attended sleep study.  ? ?The patient endorsed the Epworth Sleepiness Scale at -0 points and the Fatigue Score at -11 points.   ?The patient's weight 168 pounds with a height of 67 (inches), resulting in a BMI of 26.3 kg/m2. ?The patient's neck circumference measured 16 inches. ? ?CURRENT MEDICATIONS: Tylenol, Lipitor, Vitamin B, Calcium, Neurontin, ferrous sulfate, lidocaine patch, multivitamins, Oxycodone, Micardis, Vitamin C, Zinc gluconate, Proamatine ? ?  ?PROCEDURE:  This is a multichannel digital polysomnogram utilizing the SomnoStar 11.2 system.  Electrodes and sensors were applied and monitored per AASM Specifications.   EEG, EOG, Chin and Limb EMG, were sampled at 200 Hz.  ECG, Snore and Nasal Pressure, Thermal Airflow, Respiratory Effort, CPAP Flow and Pressure, Oximetry was sampled at 50 Hz. Digital video and audio were recorded.     ? ? ?CPAP was initiated under an Eson nasal mask-at 5 cmH20 with heated humidity per AASM standards and  pressure was advanced to finally 8 cmH20 because of hypopneas, apneas and desaturations.  At a PAP pressure of 6 cmH20, there was a reduction of the AHI to 1.6/h with improvement of sleep apnea. ? ?Lights Out was at 21:28 and Lights On at 04:54. Total recording time (TRT) was 446 minutes, with a total sleep time (TST) of 301.5 minutes. The patient's sleep latency was 39 minutes. REM latency was 57.5 minutes.  The sleep efficiency was 67.6 %.   ? ?SLEEP ARCHITECTURE: WASO (Wake after sleep onset) was 134.5 minutes.  There were 33.5 minutes in Stage N1, 136 minutes Stage N2, 52 minutes Stage N3 and 80 minutes in Stage REM.  The percentage of Stage N1 was 11.1%, Stage N2 was 45.1%, Stage N3 was 17.2% and Stage R (REM sleep) was 26.5%. '[]'$ The sleep architecture was notable for unfragmented sleep , PLMs . ? ? ? ? ?RESPIRATORY ANALYSIS:  There was a total of 13 respiratory events: 1 obstructive apneas, 3 central apneas and 2 mixed apneas with a total of 6 apneas and an apnea index (AI) of 1.2 /hour. There were 7 hypopneas with a hypopnea index of 1.4/hour. The patient also had 0 respiratory event related arousals (RERAs).     ?The total APNEA/HYPOPNEA INDEX  (AHI) was 2.6 /hour and the total RESPIRATORY DISTURBANCE INDEX was 2.6 /hour  5 events occurred in REM sleep and 8 events in NREM. The REM AHI was 3.8 /hour versus a non-REM  AHI of 2.2 /hour.  The patient spent 247 minutes of total sleep time in the supine position and 55 minutes in non-supine. The supine AHI was 3.0, versus a non-supine AHI of 1.1. ? ?OXYGEN SATURATION & C02:  The baseline 02 saturation was 95%, with the lowest being 92%. Time spent below 89% saturation equaled 0 minutes. ? ?The arousals were noted as: 11 were spontaneous, 9 were associated with PLMs, 4 were associated with respiratory events. ?PERIODIC LIMB MOVEMENTS:  The patient had a total of 317 Periodic Limb Movements. The Periodic Limb Movement (PLM) index was 63.1 and the PLM Arousal index  was 1.8 /hour. ?The EKG was irregular. See screen shot .  ?Audio and video analysis did not show any abnormal or unusual movements, behaviors, phonations or vocalizations.   ? ? ?DIAGNOSIS ?Obstructive Sleep Apnea did respond well to low pressure CPAP, 5,6 and 7 cm water. an increase to 8 cm did show central apnea emerging.  ?No hypoxia was seen. The patient was fitted with a Medium Eson 2 nasal mask  ?Non-specific abnormal EKG, variable R to R interval.  ?Moderate- severe PLMD. Suspected to relate to recent spinal surgery and infection.  ? ?PLANS/RECOMMENDATIONS: ?The current auto CPAP device will be set to a pressure of 6 cm water. The patient did well with an ESON medium mask, he can choose to use his current interface or switch to this one.  ? ? ? DISCUSSION:  ?Low pressure on CPAP controlled apnea and hypopnea and prevented central apnea from arising. ?No need for BiPAP or ASV. ?PLMs were very frequent and dropped in frequency during REM sleep (see hypnogram). CPAP therapy did not influence these PLMs.  ? ?A follow up appointment will be scheduled in the Sleep Clinic at [facility].   Please call [phone] with any questions.    ? ? ?I certify that I have reviewed the entire raw data recording prior to the issuance of this report in accordance with the Standards of Accreditation of the [place] [employer] (AASM) ? ? ? ?Larey Seat, M.D. ?Diplomat, Tax adviser of Neurology  ?Diplomat, Tax adviser of Sleep Medicine ?Medical Director, [employer] [facility] at [facility] ?

## 2022-02-12 ENCOUNTER — Telehealth: Payer: Self-pay

## 2022-02-12 NOTE — Telephone Encounter (Signed)
Pt returned my call and we discussed results of sleep study. Pt verbalized understanding on CPAP pressure to 6 and will keep f/u in July as schedule. ? ?Order change has been sent to [facility] for processing. I could not change order online.  ?

## 2022-02-12 NOTE — Telephone Encounter (Signed)
I called pt. No answer, left a message asking pt to call me back.   

## 2022-02-12 NOTE — Telephone Encounter (Signed)
-----   Message from Larey Seat, MD sent at 02/11/2022  5:27 PM EDT ----- ?DIAGNOSIS ?Obstructive Sleep Apnea did respond well to low pressure CPAP at 5,6 and 7 cm water. An increase to 8 cm pressure did show central apnea emerging.  ?No hypoxia was seen.  ?Non-specific abnormal EKG, variable R to R interval.  ?Moderate- severe PLMD. Suspected to relate to recent spinal surgery and infection.  ?? ?PLANS/RECOMMENDATIONS: ?The current auto -CPAP device will be set to a pressure of 6 cm water. The patient did well with an ESON medium mask, he can choose to use his current interface or switch to this one.  ?? ?? ? DISCUSSION:  ?1. Low pressure on CPAP controlled apnea and hypopnea and prevented central apnea from arising. ?2. No need for BiPAP or ASV. ?3. PLMs were very frequent and dropped in frequency during REM sleep (see hypnogram). CPAP therapy did not influence these PLMs ?

## 2022-02-18 ENCOUNTER — Encounter: Payer: Self-pay | Admitting: Cardiology

## 2022-02-18 ENCOUNTER — Ambulatory Visit: Payer: Medicare Other | Admitting: Cardiology

## 2022-02-18 VITALS — BP 148/67 | HR 93 | Temp 98.0°F | Resp 16 | Ht 67.0 in | Wt 162.0 lb

## 2022-02-18 DIAGNOSIS — M7989 Other specified soft tissue disorders: Secondary | ICD-10-CM

## 2022-02-18 DIAGNOSIS — E871 Hypo-osmolality and hyponatremia: Secondary | ICD-10-CM

## 2022-02-18 DIAGNOSIS — E782 Mixed hyperlipidemia: Secondary | ICD-10-CM

## 2022-02-18 DIAGNOSIS — R55 Syncope and collapse: Secondary | ICD-10-CM

## 2022-02-18 DIAGNOSIS — I10 Essential (primary) hypertension: Secondary | ICD-10-CM

## 2022-02-18 DIAGNOSIS — I951 Orthostatic hypotension: Secondary | ICD-10-CM

## 2022-02-18 NOTE — Progress Notes (Signed)
? ?Date:  02/18/2022  ? ?ID:  Cody Gonzalez, DOB [birth year], MRN [id number] ? ?PCP:  [name], FNP  ?Cardiologist:  Rex Kras, DO, [facility] (established care 01/20/2022) ? ?Date: 02/18/22 ?Last Office Visit: 01/20/2022 ? ?Chief Complaint  ?Patient presents with  ? Hypotension  ? Follow-up  ?  4 weeks  ? Results  ?  echo  ? ? ?HPI  ?Cody Gonzalez is a 76 y.o. Caucasian male whose past medical history and cardiovascular risk factors include: Benign essential hypertension, mixed hyperlipidemia, gastritis/iron deficiency anemia, prostate cancer, chronic hyponatremia, advanced age. ? ?He is referred to the office at the request of [name], FNP for evaluation of orthostatic hypotension. ? ?Patient is accompanied by his wife [name] at today's office visit who also provides collateral history as part of today's encounter. ? ?In January 2023 he underwent lumbar spinal fusion surgery at [facility] and 10 days later was noted to have epidural abscess requiring urgent surgery on 12/15/2021.  During the same hospitalization he had a vasovagal syncope while working with PT and OT.  His antihypertensive medications were held and he was prescribed midodrine to help support his pressures. ? ?He was referred to cardiology for evaluation of orthostatic hypotension/syncope.  At last office visit I explained to both the patient and his wife that the vasovagal episode after her back surgery is a common occurrence and this event was also perpetuated by underlying anemia, pain control management, postoperative fluid shifts, and antihypertensive medications. ? ?At last office visit he was enrolled into principal care management and his blood pressures have been monitored peripherally in the meantime.  His telmisartan dose has been increased to the maximum tolerated dose and he has been restarted on nifedipine 30 mg p.o. daily.  His blood pressures are within acceptable range and he is no longer having episodes of  near syncope/syncope/lightheaded/dizziness. ? ?He also had a echocardiogram since last office visit which was reviewed with the patient and his wife in detail and noted below for further reference. ? ?FUNCTIONAL STATUS: ?Overall functional status is limited due to him ambulating with a walker and recent surgeries as described above. ? ?ALLERGIES: ?Allergies  ?Allergen Reactions  ? Amlodipine Besylate Swelling and Other (See Comments)  ?  Ankles swell  ? ? ?MEDICATION LIST PRIOR TO VISIT: ?Current Meds  ?Medication Sig  ? acetaminophen (TYLENOL) 325 MG tablet Take 2 tablets (650 mg total) by mouth every 6 (six) hours as needed for mild pain (or Fever >/= 101).  ? alfuzosin (UROXATRAL) 10 MG 24 hr tablet Take 1 tablet by mouth daily at 6 (six) AM.  ? atorvastatin (LIPITOR) 40 MG tablet Take 40 mg by mouth at bedtime.  ? b complex vitamins capsule Take 1 capsule by mouth daily.  ? CALCIUM PO Take 1,200 tablets by mouth daily.  ? doxycycline (VIBRAMYCIN) 100 MG capsule Take 100 mg by mouth 2 (two) times daily.  ? ferrous sulfate 325 (65 FE) MG tablet Take 325 mg by mouth at bedtime.  ? HYDROcodone-acetaminophen (NORCO/VICODIN) 5-325 MG tablet Take by mouth.  ? Lidocaine 4 % PTCH Apply 2 patches topically daily.  ? Multiple Vitamins-Iron (MULTIVITAMIN/IRON PO) Take 1 tablet by mouth daily with breakfast.  ? NIFEdipine (ADALAT CC) 60 MG 24 hr tablet Take 30 mg by mouth daily.  ? telmisartan (MICARDIS) 80 MG tablet Take 80 mg by mouth daily.  ? vitamin C (ASCORBIC ACID) 500 MG tablet Take 500 mg by mouth daily.  ?  zinc gluconate 50 MG tablet Take 50 mg by mouth daily. X 14 days, will end on 01-06-22  ?  ? ?PAST MEDICAL HISTORY: ?Past Medical History:  ?Diagnosis Date  ? Anemia   ? Hypertension   ? Prostate cancer ([place])   ? Sleep apnea   ? uses cpap   ? TBI (traumatic brain injury) ([place]) 2015  ? ? ?PAST SURGICAL HISTORY: ?Past Surgical History:  ?Procedure Laterality Date  ? BACK SURGERY    ? BILATERAL CARPAL TUNNEL RELEASE   2014  ? CYSTOSCOPY N/A 06/29/2019  ? Procedure: CYSTOSCOPY;  Surgeon: Alexis Frock, MD;  Location: [facility];  Service: Urology;  Laterality: N/A;  No seeds in bladder per Dr. Tresa Moore  ? LUMBAR LAMINECTOMY/DECOMPRESSION MICRODISCECTOMY Left 05/28/2021  ? Procedure: Left  - Lumbar five-Sacral one Laminectomy resection of synovial cyst;  Surgeon: Earnie Larsson, MD;  Location: [place] [facility];  Service: Neurosurgery;  Laterality: Left;  ? PROSTATE BIOPSY    ? RADIOACTIVE SEED IMPLANT N/A 06/29/2019  ? Procedure: RADIOACTIVE SEED IMPLANT/BRACHYTHERAPY IMPLANT;  Surgeon: Alexis Frock, MD;  Location: [facility];  Service: Urology;  Laterality: N/A;  90 MINS  ? SPACE OAR INSTILLATION N/A 06/29/2019  ? Procedure: SPACE OAR INSTILLATION;  Surgeon: Alexis Frock, MD;  Location: [facility];  Service: Urology;  Laterality: N/A;  ? ? ?FAMILY HISTORY: ?The patient family history includes Brain cancer in his father. ? ?SOCIAL HISTORY:  ?The patient  reports that he has never smoked. He has never used smokeless tobacco. He reports current alcohol use of about 2.0 standard drinks per week. He reports that he does not use drugs. ? ?REVIEW OF SYSTEMS: ?Review of Systems  ?Cardiovascular:  Negative for chest pain, dyspnea on exertion, leg swelling, near-syncope, orthopnea, palpitations, paroxysmal nocturnal dyspnea and syncope.  ?Respiratory:  Negative for shortness of breath.   ? ?PHYSICAL EXAM: ? ?  02/18/2022  ?  1:06 PM 02/10/2022  ? 11:58 PM 02/10/2022  ?  8:46 PM  ?Vitals with BMI  ?Height '5\' 7"'$     ?Weight 162 lbs    ?BMI 25.37    ?Systolic 163 846 659  ?Diastolic 67 75 79  ?Pulse 93 77 72  ? ?Orthostatic VS for the past 72 hrs (Last 3 readings): ? Orthostatic BP Patient Position BP Location Cuff Size Orthostatic Pulse  ?02/18/22 1310 137/61 Standing Left Arm Normal 88  ?02/18/22 1309 140/66 Sitting Left Arm Normal 86  ?02/18/22 1308 143/66 Supine Left Arm Normal 84  ? ?CONSTITUTIONAL: Age  appropriate, ambulate w/ 4 wheel walker, hemodynamically stable, well-nourished. No acute distress.  ?SKIN: Skin is warm and dry. No rash noted. No cyanosis. No pallor. No jaundice ?HEAD: Normocephalic and atraumatic.  ?EYES: No scleral icterus ?MOUTH/THROAT: Moist oral membranes.  ?NECK: No JVD present. No thyromegaly noted. No carotid bruits  ?LYMPHATIC: No visible cervical adenopathy.  ?CHEST Normal respiratory effort. No intercostal retractions. Back brace.  ?LUNGS: Clear to auscultation bilaterally.  No stridor. No wheezes. No rales.  ?CARDIOVASCULAR: Regular rate and rhythm, positive S1-S2, no murmurs rubs or gallops appreciated. ?ABDOMINAL: Soft, nontender, nondistended, positive bowel sounds in all 4 quadrants, no apparent ascites.  ?EXTREMITIES: Warm to touch, 2+ pitting edema in the left lower extremity. ?HEMATOLOGIC: No significant bruising ?NEUROLOGIC: Oriented to person, place, and time. Nonfocal. Normal muscle tone.  ?PSYCHIATRIC: Normal mood and affect. Normal behavior. Cooperative ? ?CARDIAC DATABASE: ?EKG: ?01/20/2022: NSR, 69 bpm, normal axis, without underlying ischemia injury pattern.   ? ?  Echocardiogram: ?01/27/2022:  ? 1. Left ventricular ejection fraction, by estimation, is 60 to 65%. The left ventricle has normal function. The left ventricle has no regional wall motion abnormalities. Left ventricular diastolic parameters were normal. The average left ventricular  ?global longitudinal strain is -22.9 %. The global longitudinal strain is normal.  ? 2. Right ventricular systolic function is normal. The right ventricular size is normal.  ? 3. Left atrial size was moderately dilated.  ? 4. The mitral valve is grossly normal. Mild mitral valve regurgitation.  ?No evidence of mitral stenosis.  ? 5. The aortic valve is grossly normal. Aortic valve regurgitation is trivial. Aortic valve sclerosis is present, with no evidence of aortic valve stenosis.  ? 6. The inferior vena cava is normal in size with  greater than 50% respiratory variability, suggesting right atrial pressure of 3 mmHg.  ?  ?Stress Testing: ?No results found for this or any previous visit from the past 1095 days. ? ?Heart Cathete

## 2022-02-19 ENCOUNTER — Ambulatory Visit (HOSPITAL_COMMUNITY)
Admission: RE | Admit: 2022-02-19 | Discharge: 2022-02-19 | Disposition: A | Discharge status: Home / Self Care | Payer: Medicare Other | Source: Ambulatory Visit | Attending: Cardiology | Admitting: Cardiology

## 2022-02-19 DIAGNOSIS — M7989 Other specified soft tissue disorders: Secondary | ICD-10-CM | POA: Insufficient documentation

## 2022-02-20 ENCOUNTER — Emergency Department (HOSPITAL_COMMUNITY)
Admission: EM | Admit: 2022-02-20 | Discharge: 2022-02-21 | Disposition: A | Discharge status: Home / Self Care | Payer: Medicare Other | Attending: Emergency Medicine | Admitting: Emergency Medicine

## 2022-02-20 ENCOUNTER — Other Ambulatory Visit: Payer: Self-pay

## 2022-02-20 ENCOUNTER — Encounter (HOSPITAL_COMMUNITY): Payer: Self-pay | Admitting: Emergency Medicine

## 2022-02-20 DIAGNOSIS — Z79899 Other long term (current) drug therapy: Secondary | ICD-10-CM | POA: Diagnosis not present

## 2022-02-20 DIAGNOSIS — E871 Hypo-osmolality and hyponatremia: Secondary | ICD-10-CM | POA: Insufficient documentation

## 2022-02-20 DIAGNOSIS — W19XXXA Unspecified fall, initial encounter: Secondary | ICD-10-CM | POA: Diagnosis not present

## 2022-02-20 DIAGNOSIS — S22080A Wedge compression fracture of T11-T12 vertebra, initial encounter for closed fracture: Secondary | ICD-10-CM | POA: Diagnosis not present

## 2022-02-20 DIAGNOSIS — I10 Essential (primary) hypertension: Secondary | ICD-10-CM | POA: Diagnosis not present

## 2022-02-20 DIAGNOSIS — S3992XA Unspecified injury of lower back, initial encounter: Secondary | ICD-10-CM | POA: Diagnosis present

## 2022-02-20 LAB — BASIC METABOLIC PANEL
Anion gap: 7 (ref 5–15)
BUN: 17 mg/dL (ref 8–23)
CO2: 24 mmol/L (ref 22–32)
Calcium: 8.6 mg/dL — ABNORMAL LOW (ref 8.9–10.3)
Chloride: 94 mmol/L — ABNORMAL LOW (ref 98–111)
Creatinine, Ser: 0.71 mg/dL (ref 0.61–1.24)
GFR, Estimated: >60 mL/min (ref >60)
Glucose, Bld: 103 mg/dL — ABNORMAL HIGH (ref 70–99)
Potassium: 4 mmol/L (ref 3.5–5.1)
Sodium: 125 mmol/L — ABNORMAL LOW (ref 135–145)

## 2022-02-20 LAB — CBC WITH DIFFERENTIAL/PLATELET
Abs Immature Granulocytes: 0.02 10*3/uL (ref 0.00–0.07)
Basophils Absolute: 0 10*3/uL (ref 0.0–0.1)
Basophils Relative: 0 %
Eosinophils Absolute: 0.1 10*3/uL (ref 0.0–0.5)
Eosinophils Relative: 2 %
HCT: 28.9 % — ABNORMAL LOW (ref 39.0–52.0)
Hemoglobin: 10 g/dL — ABNORMAL LOW (ref 13.0–17.0)
Immature Granulocytes: 1 %
Lymphocytes Relative: 12 %
Lymphs Abs: 0.5 10*3/uL — ABNORMAL LOW (ref 0.7–4.0)
MCH: 31.9 pg (ref 26.0–34.0)
MCHC: 34.6 g/dL (ref 30.0–36.0)
MCV: 92.3 fL (ref 80.0–100.0)
Monocytes Absolute: 0.6 10*3/uL (ref 0.1–1.0)
Monocytes Relative: 13 %
Neutro Abs: 3.1 10*3/uL (ref 1.7–7.7)
Neutrophils Relative %: 72 %
Platelets: 252 10*3/uL (ref 150–400)
RBC: 3.13 MIL/uL — ABNORMAL LOW (ref 4.22–5.81)
RDW: 13.7 % (ref 11.5–15.5)
WBC: 4.3 10*3/uL (ref 4.0–10.5)
nRBC: 0 % (ref 0.0–0.2)

## 2022-02-20 NOTE — ED Provider Triage Note (Signed)
?  Emergency Medicine Provider Triage Evaluation Note ? ?MRN:  [id number]  ?Arrival date & time: 02/20/22    ?Medically screening exam initiated at 10:41 PM.   ?CC:   ?Hypertension ?  ?HPI:  ?Cody Gonzalez is a 76 y.o. year-old male presents to the ED with chief complaint of elevated blood pressure reading.  He is accompanied by family, who reports that his blood pressure was in the 573U systolic.  Patient denies any new symptoms.  He states that he has acute on chronic back pain and has had recent surgery on his back.  However, regarding tonight's visit, he was brought in solely for his elevated blood pressure reading. ? ?History provided by  ?ROS:  ?-As included in HPI ?PE:  ? ?Vitals:  ? 02/20/22 2227  ?BP: (!) 184/88  ?Pulse: 63  ?Resp: 18  ?Temp: 98.2 ?F (36.8 ?C)  ?SpO2: 100%  ?  ?Non-toxic appearing ?No respiratory distress ? ?MDM:  ?Patient here with elevated blood pressure reading, will check basic labs.  Patient has CT lumbar spine and lumbar MRI ordered on outpatient basis, given that he has not really had any change in his symptoms tonight other than elevated blood pressure I do not know that we necessarily have to get these tests tonight.  We will start with basic labs. ? ?Patient was informed that the remainder of the evaluation will be completed by another provider, this initial triage assessment does not replace that evaluation, and the importance of remaining in the ED until their evaluation is complete. ? ?  ?Montine Circle, PA-C ?02/20/22 2243 ? ?

## 2022-02-20 NOTE — ED Triage Notes (Signed)
Pt comes from home with c/o hypertension.  ?

## 2022-02-21 ENCOUNTER — Telehealth: Payer: Self-pay | Admitting: Student

## 2022-02-21 ENCOUNTER — Emergency Department (HOSPITAL_COMMUNITY): Payer: Medicare Other

## 2022-02-21 DIAGNOSIS — I10 Essential (primary) hypertension: Secondary | ICD-10-CM

## 2022-02-21 DIAGNOSIS — S22080A Wedge compression fracture of T11-T12 vertebra, initial encounter for closed fracture: Secondary | ICD-10-CM | POA: Diagnosis not present

## 2022-02-21 IMAGING — CR DG LUMBAR SPINE COMPLETE 4+V
5 series · 5 of 5 positions shown · non-contrast
Comparison: Study of [DATE], and more recent lumbar spine MRI
[DATE]

CLINICAL DATA: Fall injury. Increased back pain. Prior surgical
change.

EXAM:
LUMBAR SPINE - COMPLETE 4+ VIEW

[l-spine ap]
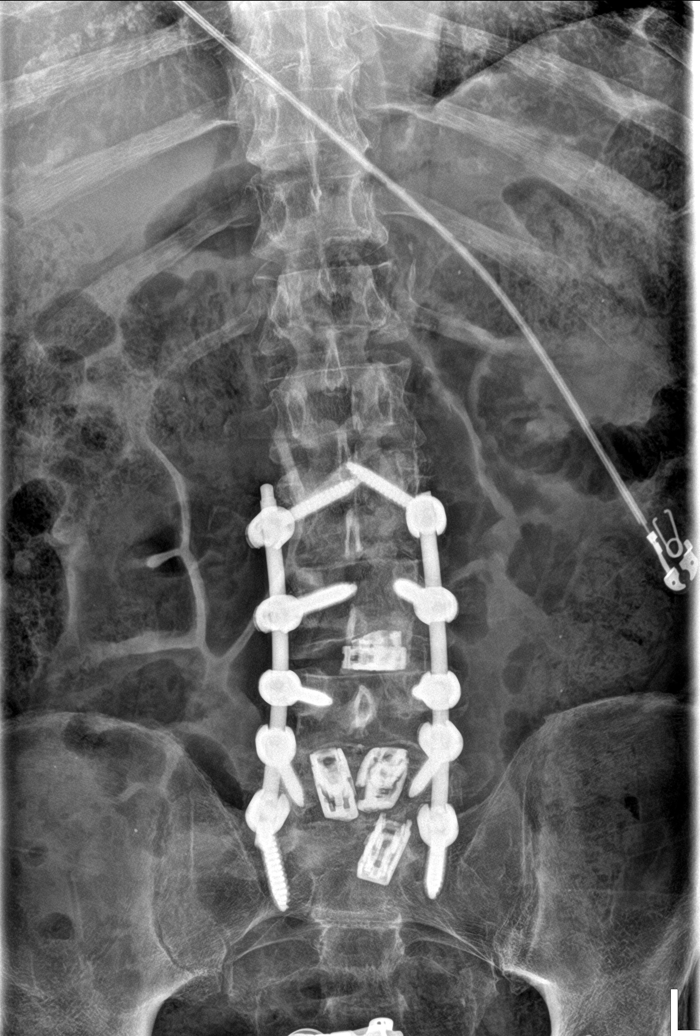

[l-spine obl (1 of 2)]
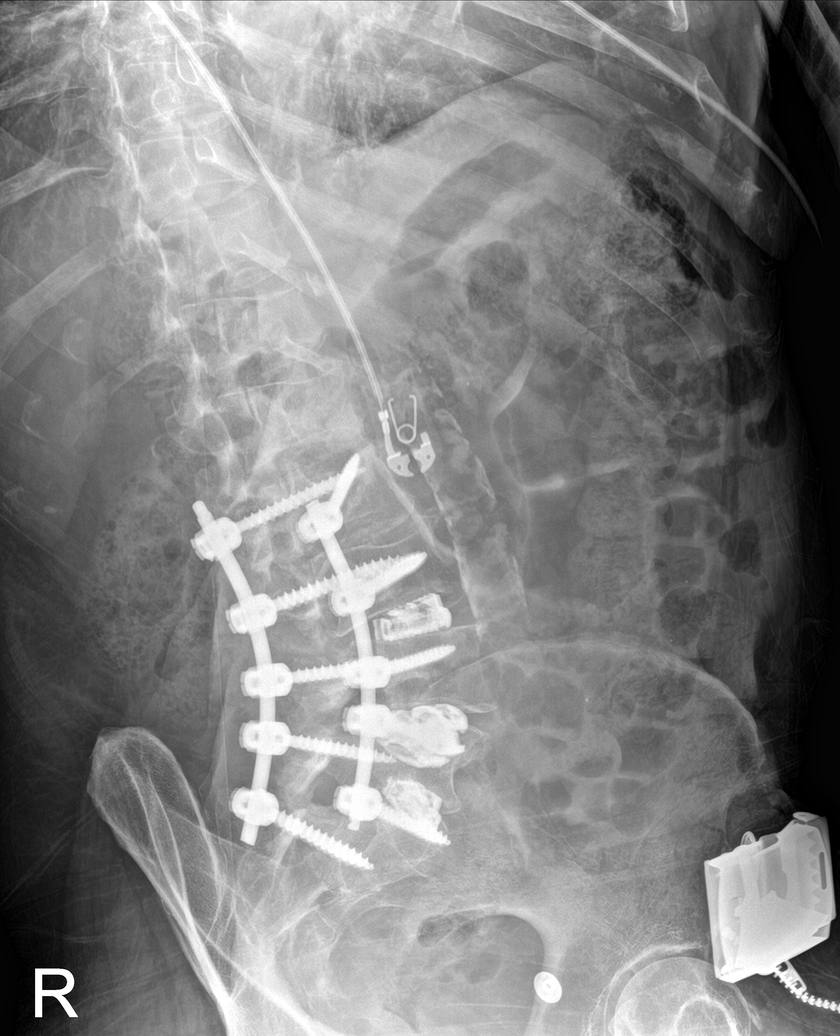

[l-spine obl (2 of 2)]
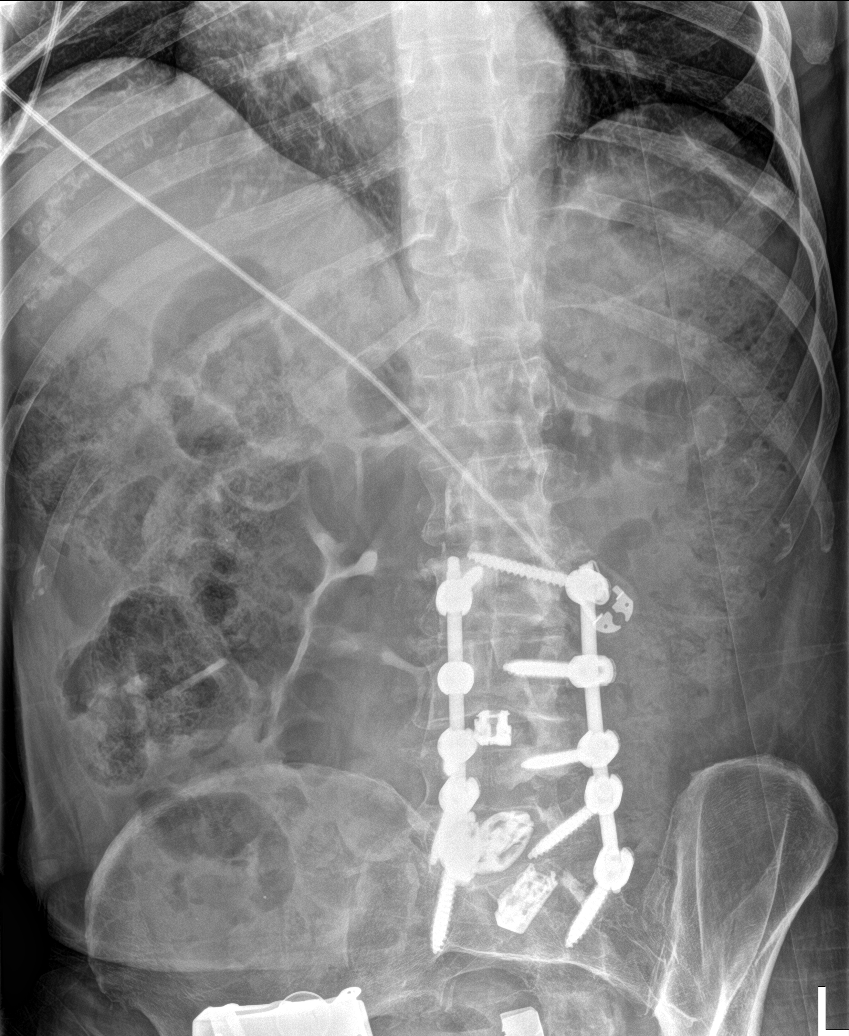

[l-spine lat]
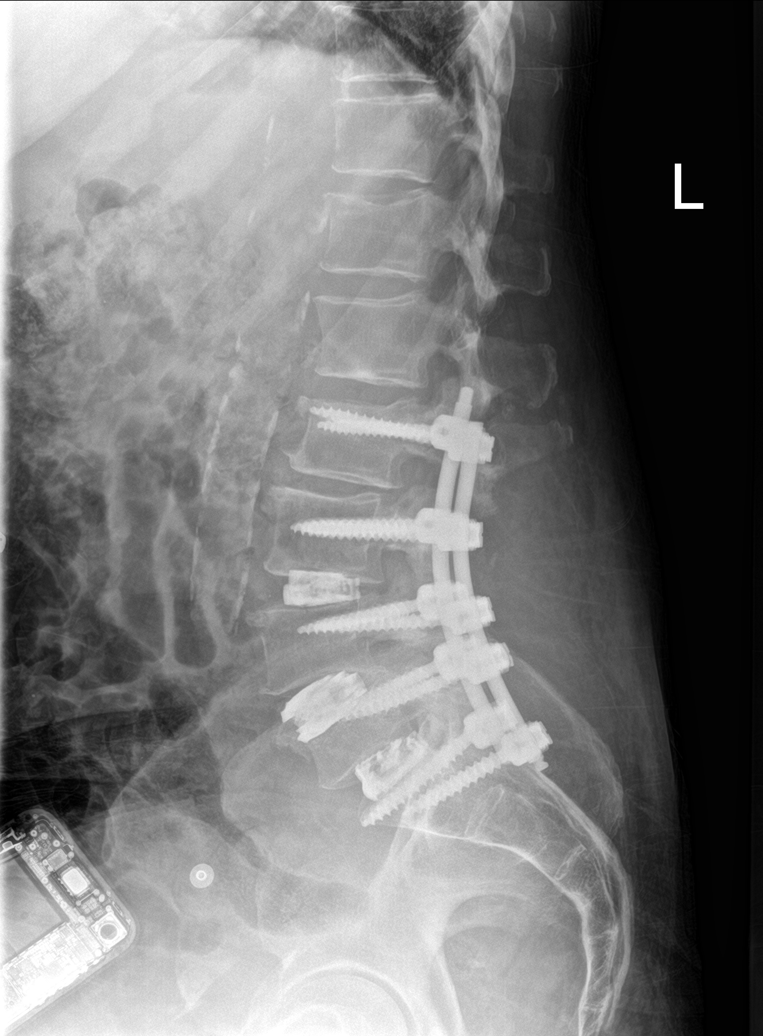

[l-spine spot]
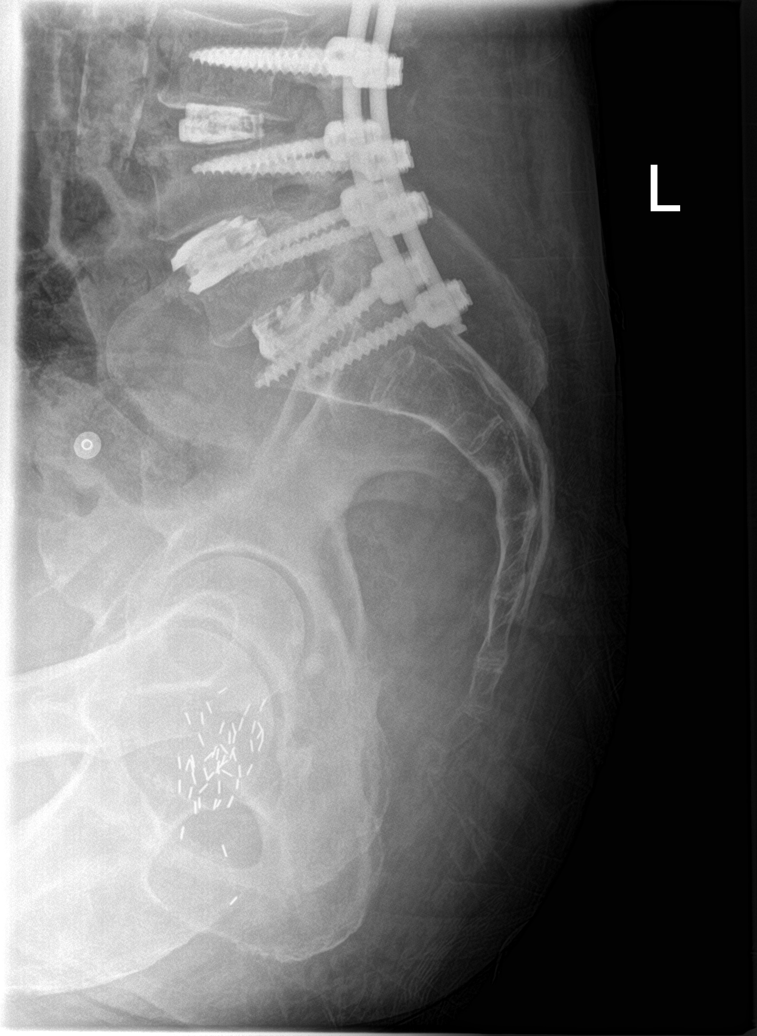

[5 of 5 positions shown; findings below may reference images not displayed]

FINDINGS: There are mild subacute compression fractures of the inferior
endplate of L1 and the superior endplate of L2, better visualized on
MRI.

There is new mild superior endplate anterior wedge deformity of the
T12 vertebral body, which is probably acute. Anterior vertebral
height loss however, is no more than 10% with no posterior height
loss or retropulsion.

Rest of the vertebra are normal in heights with osteopenia. There is
moderate spondylosis.

Posterior fusion construct with bilateral rods and pedicle screws
again spans L2-S1 with interbody metallic spacing hardware at the
lowest 3 levels and laminectomy defects L3-5.

There is a slight levoscoliosis which has been seen previously, as
well as a mild grade 1 retrolisthesis at L2-3 and a mild grade 1
anterolisthesis at L4-5, all unchanged.

There are no further significant findings or changes. The abdominal
aorta is heavily calcified.
IMPRESSION: 1. Most notably there is a new mild upper plate anterior compression
fracture of T12 which is probably acute, but loss of anterior height
is no more than 10% with no posterior height loss, no retropulsion.
2. There are subacute, unchanged mild compression fractures of the
lower plate of L1, and of the upper plate of L2 about the hardware.
3. Osteopenia and spondylosis.  Stable postsurgical change.
4. Aortic atherosclerosis.

## 2022-02-21 MED ORDER — HYDROCODONE-ACETAMINOPHEN 5-325 MG PO TABS
1.5000 | ORAL_TABLET | Freq: Once | ORAL | Status: AC
  Administered 2022-02-21: 1.5 via ORAL
  Filled 2022-02-21: qty 2

## 2022-02-21 NOTE — Progress Notes (Signed)
Called and reviewed lab results with pt's wife. Pt aware of negative DVT results as well. Pt recovering well since being home. BP improved this morning to 134/72. Pt continues to take nifedipine 30 mg and telmisartan 80 mg. Pt with recent fall and noted to have a new L1 compression fracture. Pt planing on following up with his neurologist and orthopedist next month. Pt reports the fall was mechanical in nature while trying to get up from the toilet without his caine. Denies complains of lightheadedness, dizziness, syncope like concerns prior to the fall. Will continue close monitoring and follow up closely. May consider increasing nifedipine dose from 30 to 60 mg if SBP continues to remain elevated. Recent BP spike likely due to pain induced BP elevation.

## 2022-02-21 NOTE — Telephone Encounter (Signed)
ON-CALL CARDIOLOGY ?02/20/2022 ? ?Patient's name: Cody Gonzalez.   ?MRN: [id number].    ?DOB: [birth year] ?Primary care provider: [name], FNP. ?Primary cardiologist: Dr. Terri Skains ? ?Interaction regarding this patient's care today: ?Patient's wife called to report that she had taken patient to the emergency department given elevated blood pressure with home reading as high as 196/98 mmHg this evening.  Patient also had a fall yesterday and his chronic back pain has been exacerbated. ? ?Impression: ?  ICD-10-CM   ?1. Benign hypertension  I10   ?  ? ? ?No orders of the defined types were placed in this encounter. ? ? ?No orders of the defined types were placed in this encounter. ? ? ?Recommendations: ?Reassured his wife that we would see the patient in consultation if necessary. ? ?Telephone encounter total time: 4 minutes  ? ? ? ?Alethia Berthold, PA-C ?02/21/2022, 8:55 AM ?Office: [phone]  ?

## 2022-02-21 NOTE — ED Provider Notes (Signed)
?[facility] ?Provider Note ? ? ?CSN: [id number] ?Arrival date & time: 02/20/22  2136 ? ?  ? ?History ? ?Chief Complaint  ?Patient presents with  ? Hypertension  ? ? ?Cody Gonzalez is a 76 y.o. male. ? ?The history is provided by the patient, the spouse and medical records.  ?Hypertension ?Cody Gonzalez is a 76 y.o. male who presents to the Emergency Department complaining of HTN.  He presents to the ED accompanied by his wife for abnormal blood pressure.  He has been checking his blood pressure 3 times daily since February 21.  This evening his blood pressures were the highest they have ever been at 196/98.  Overall he feels well.  No headache, chest pain, difficulty breathing, numbness, weakness, vision changes.  He did have a fall last night and states that his chronic back pain is slightly worse than baseline. ? ? ? ?  ? ?Home Medications ?Prior to Admission medications   ?Medication Sig Start Date End Date Taking? Authorizing Provider  ?acetaminophen (TYLENOL) 325 MG tablet Take 2 tablets (650 mg total) by mouth every 6 (six) hours as needed for mild pain (or Fever >/= 101). 01/05/22   Nolberto Hanlon, MD  ?alfuzosin (UROXATRAL) 10 MG 24 hr tablet Take 1 tablet by mouth daily at 6 (six) AM.    [provider]  ?atorvastatin (LIPITOR) 40 MG tablet Take 40 mg by mouth at bedtime. 09/01/19   [provider]  ?b complex vitamins capsule Take 1 capsule by mouth daily.    [provider]  ?CALCIUM PO Take 1,200 tablets by mouth daily.    [provider]  ?doxycycline (VIBRAMYCIN) 100 MG capsule Take 100 mg by mouth 2 (two) times daily.    [provider]  ?ferrous sulfate 325 (65 FE) MG tablet Take 325 mg by mouth at bedtime.    [provider]  ?HYDROcodone-acetaminophen (NORCO/VICODIN) 5-325 MG tablet Take by mouth. 02/14/22   [provider]  ?Lidocaine 4 % PTCH Apply 2 patches topically daily.     [provider]  ?Multiple Vitamins-Iron (MULTIVITAMIN/IRON PO) Take 1 tablet by mouth daily with breakfast.    [provider]  ?NIFEdipine (ADALAT CC) 60 MG 24 hr tablet Take 30 mg by mouth daily.    [provider]  ?telmisartan (MICARDIS) 80 MG tablet Take 80 mg by mouth daily.    [provider]  ?vitamin C (ASCORBIC ACID) 500 MG tablet Take 500 mg by mouth daily.    [provider]  ?zinc gluconate 50 MG tablet Take 50 mg by mouth daily. X 14 days, will end on 01-06-22    [provider]  ?   ? ?Allergies    ?Amlodipine besylate   ? ?Review of Systems   ?Review of Systems  ?All other systems reviewed and are negative. ? ?Physical Exam ?Updated Vital Signs ?BP (!) 156/90   Pulse 67   Temp 98.2 ?F (36.8 ?C) (Oral)   Resp 20   Ht '5\' 7"'$  (1.702 m)   Wt 73.5 kg   SpO2 98%   BMI 25.37 kg/m?  ?Physical Exam ?Vitals and nursing note reviewed.  ?Constitutional:   ?   Appearance: He is well-developed.  ?HENT:  ?   Head: Normocephalic and atraumatic.  ?Cardiovascular:  ?   Rate and Rhythm: Normal rate and regular rhythm.  ?   Heart sounds: No murmur heard. ?Pulmonary:  ?   Effort: Pulmonary effort  is normal. No respiratory distress.  ?   Breath sounds: Normal breath sounds.  ?Abdominal:  ?   Palpations: Abdomen is soft.  ?   Tenderness: There is no abdominal tenderness. There is no guarding or rebound.  ?Musculoskeletal:     ?   General: No tenderness.  ?   Comments: 2+ pitting edema to the left lower extremity, trace edema to the right lower extremity.  ?Skin: ?   General: Skin is warm and dry.  ?Neurological:  ?   Mental Status: He is alert and oriented to person, place, and time.  ?   Comments: 5 out of 5 strength in bilateral lower extremities  ?Psychiatric:     ?   Behavior: Behavior normal.  ? ? ?ED Results / Procedures / Treatments   ?Labs ?(all labs ordered are listed, but only abnormal results are displayed) ?Labs Reviewed  ?CBC WITH  DIFFERENTIAL/PLATELET - Abnormal; Notable for the following components:  ?    Result Value  ? RBC 3.13 (*)   ? Hemoglobin 10.0 (*)   ? HCT 28.9 (*)   ? Lymphs Abs 0.5 (*)   ? All other components within normal limits  ?BASIC METABOLIC PANEL - Abnormal; Notable for the following components:  ? Sodium 125 (*)   ? Chloride 94 (*)   ? Glucose, Bld 103 (*)   ? Calcium 8.6 (*)   ? All other components within normal limits  ? ? ?EKG ?EKG Interpretation ? ?Date/Time:  Thursday February 20 2022 22:26:26 EDT ?Ventricular Rate:  66 ?PR Interval:  156 ?QRS Duration: 86 ?QT Interval:  396 ?QTC Calculation: 415 ?R Axis:   42 ?Text Interpretation: Normal sinus rhythm Normal ECG When compared with ECG of 03-Jan-2022 10:12, PREVIOUS ECG IS PRESENT Confirmed by Quintella Reichert [phone]) on 02/21/2022 1:23:19 AM ? ?Radiology ?DG Lumbar Spine Complete ? ?Result Date: 02/21/2022 ?CLINICAL DATA:  Fall injury. Increased back pain. Prior surgical change. EXAM: LUMBAR SPINE - COMPLETE 4+ VIEW COMPARISON:  Study of 12/14/2021, and more recent lumbar spine MRI 02/10/2022 FINDINGS: There are mild subacute compression fractures of the inferior endplate of L1 and the superior endplate of L2, better visualized on MRI. There is new mild superior endplate anterior wedge deformity of the T12 vertebral body, which is probably acute. Anterior vertebral height loss however, is no more than 10% with no posterior height loss or retropulsion. Rest of the vertebra are normal in heights with osteopenia. There is moderate spondylosis. Posterior fusion construct with bilateral rods and pedicle screws again spans L2-S1 with interbody metallic spacing hardware at the lowest 3 levels and laminectomy defects L3-5. There is a slight levoscoliosis which has been seen previously, as well as a mild grade 1 retrolisthesis at L2-3 and a mild grade 1 anterolisthesis at L4-5, all unchanged. There are no further significant findings or changes. The abdominal aorta is heavily  calcified. IMPRESSION: 1. Most notably there is a new mild upper plate anterior compression fracture of T12 which is probably acute, but loss of anterior height is no more than 10% with no posterior height loss, no retropulsion. 2. There are subacute, unchanged mild compression fractures of the lower plate of L1, and of the upper plate of L2 about the hardware. 3. Osteopenia and spondylosis.  Stable postsurgical change. 4. Aortic atherosclerosis. Electronically Signed   By: Telford Nab M.D.   On: 02/21/2022 05:20  ? ?VAS [place] LOWER EXTREMITY VENOUS (DVT) ? ?Result Date: 02/20/2022 ? Lower Venous DVT Study Patient  Name:  Elmarie Mainland  Date of Exam:   02/19/2022 Medical Rec #: [id number]               Accession #:    [id number] Date of Birth: 1946-03-30                Patient Gender: M Patient Age:   53 years Exam Location:  [facility] Procedure:      VAS [place] LOWER EXTREMITY VENOUS (DVT) Referring Phys: Rex Kras --------------------------------------------------------------------------------  Indications: Asymmetrical swelling LT>RT.  Comparison       12-14-2021 Prior left lower extremity venous was negative for Study:           DVT. Performing Technologist: Darlin Coco RDMS, RVT  Examination Guidelines: A complete evaluation includes B-mode imaging, spectral Doppler, color Doppler, and power Doppler as needed of all accessible portions of each vessel. Bilateral testing is considered an integral part of a complete examination. Limited examinations for reoccurring indications may be performed as noted. The reflux portion of the exam is performed with the patient in reverse Trendelenburg.  +---------+---------------+---------+-----------+----------+--------------+ RIGHT    CompressibilityPhasicitySpontaneityPropertiesThrombus Aging +---------+---------------+---------+-----------+----------+--------------+ CFV      Full           Yes      Yes                                  +---------+---------------+---------+-----------+----------+--------------+ SFJ      Full                                                        +---------+---------------+---------+-----------+----------+--------------+ FV Prox  Full

## 2022-02-24 ENCOUNTER — Ambulatory Visit
Admission: RE | Admit: 2022-02-24 | Discharge: 2022-02-24 | Disposition: A | Discharge status: Home / Self Care | Payer: Medicare Other | Source: Ambulatory Visit | Attending: Gastroenterology | Admitting: Gastroenterology

## 2022-02-24 DIAGNOSIS — R933 Abnormal findings on diagnostic imaging of other parts of digestive tract: Secondary | ICD-10-CM

## 2022-02-24 DIAGNOSIS — R198 Other specified symptoms and signs involving the digestive system and abdomen: Secondary | ICD-10-CM

## 2022-02-24 IMAGING — CT CT ENTEROGRAPHY (ABD-PELV W/ CM)
1 of 5 series · 13 of 32 positions shown, 18 images · IV contrast (APPLIED)
Comparison: [DATE].  MR and CT lumbar spine [DATE].

CLINICAL DATA: Prostate cancer. Abnormal capsule endoscopy. *
Tracking Code: BO *

EXAM:
CT ABDOMEN AND PELVIS WITH CONTRAST (ENTEROGRAPHY)
TECHNIQUE: Multidetector CT of the abdomen and pelvis during bolus
administration of intravenous contrast. Negative oral contrast was
given.

[Series 5: thins · axial · 0.74mm/px · z∈[-107,+352]mm · 13 of 521 slices shown, 18 images]
[im 31/521  soft-tissue]
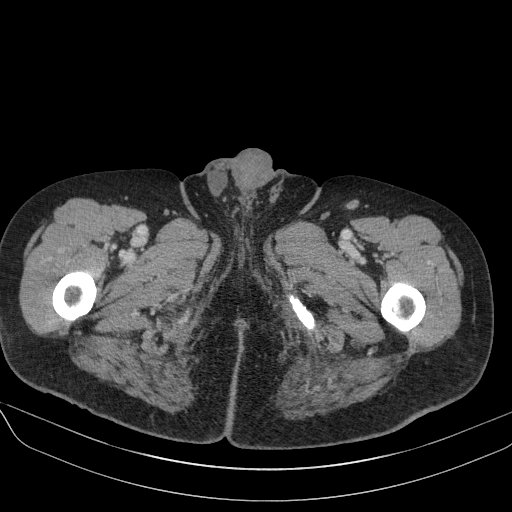
[im 31/521  bone]
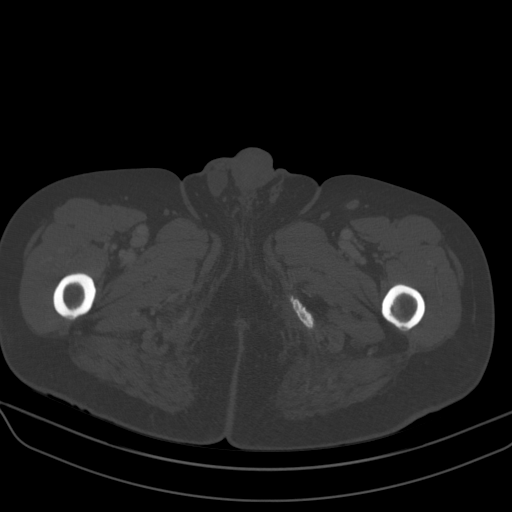
[im 92/521  soft-tissue]
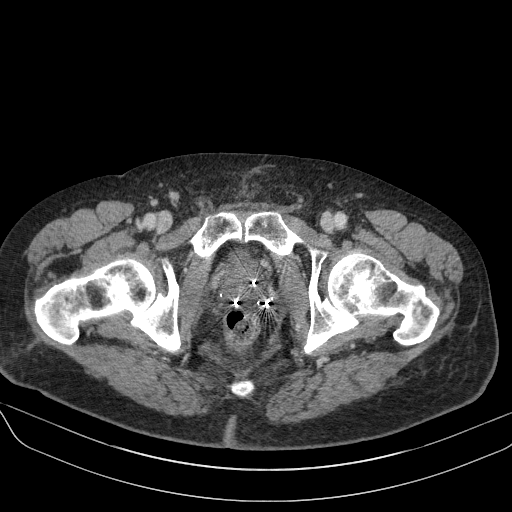
[im 123/521  soft-tissue]
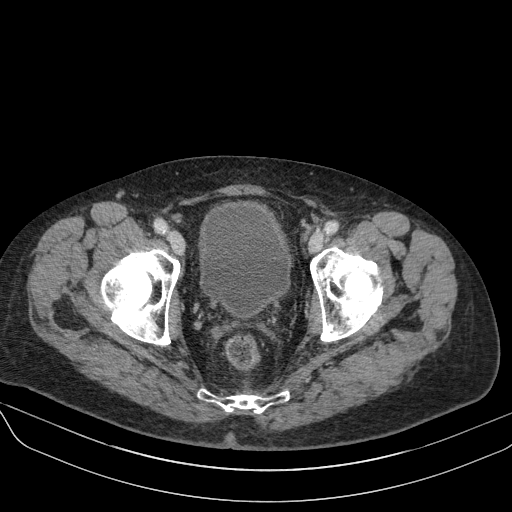
[im 153/521  soft-tissue]
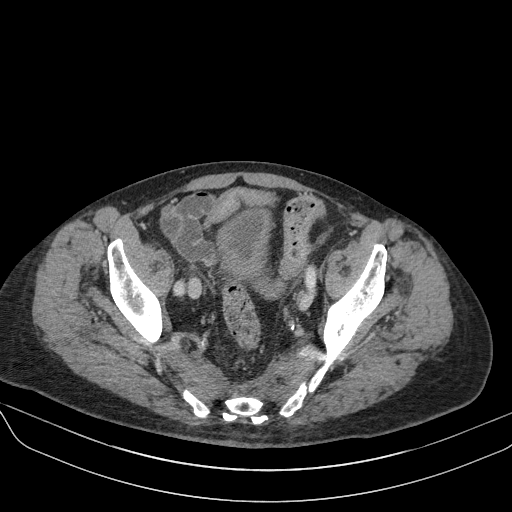
[im 215/521  soft-tissue]
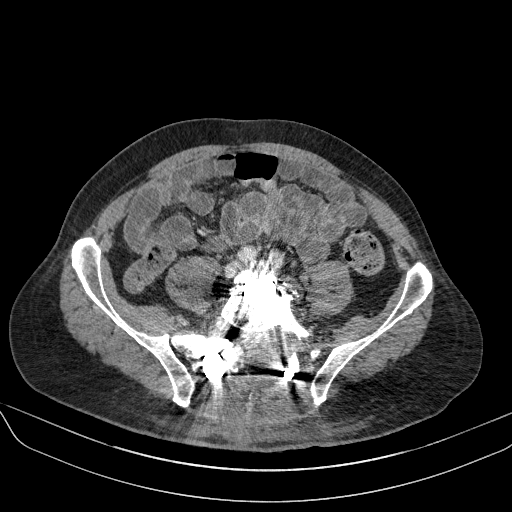
[im 245/521  soft-tissue]
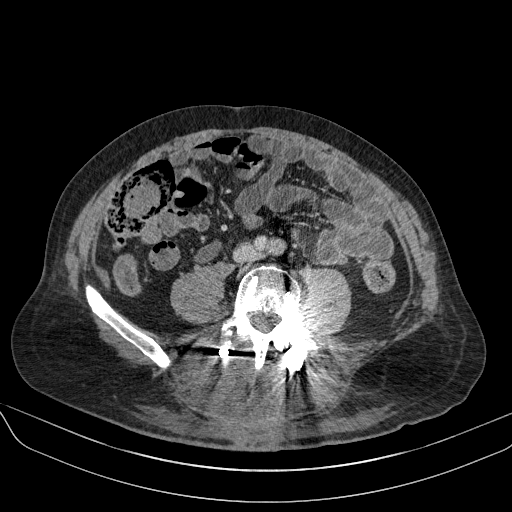
[im 276/521  soft-tissue]
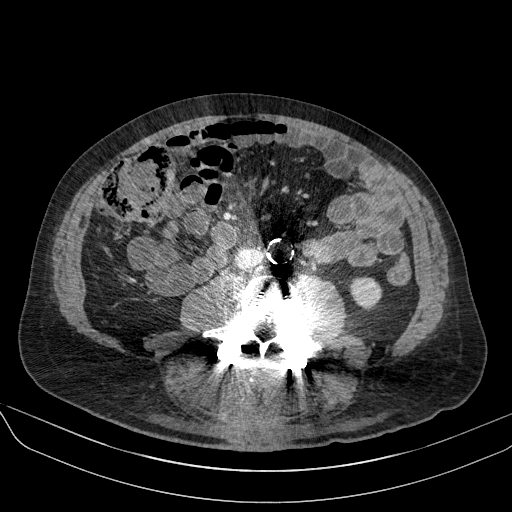
[im 337/521  soft-tissue]
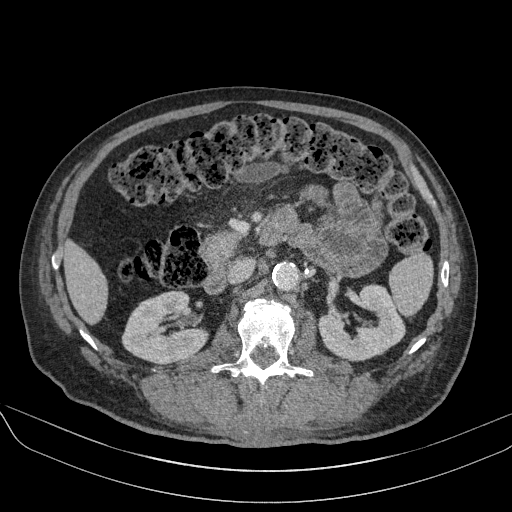
[im 368/521  soft-tissue]
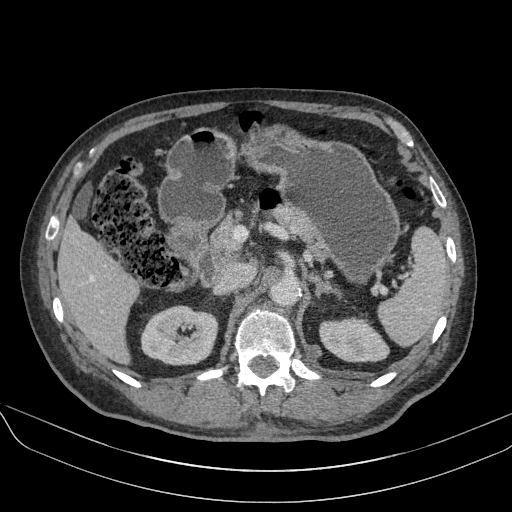
[im 368/521  bone]
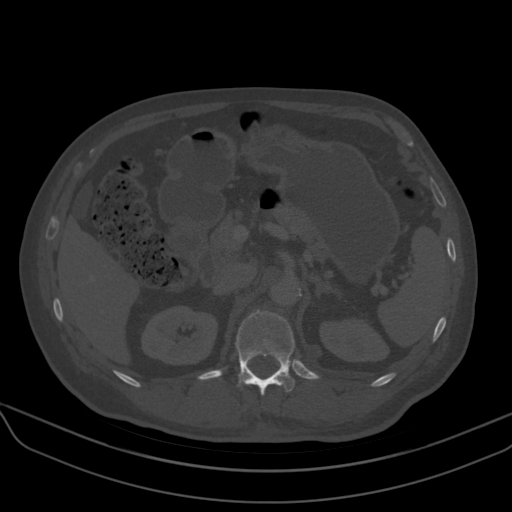
[im 398/521  soft-tissue]
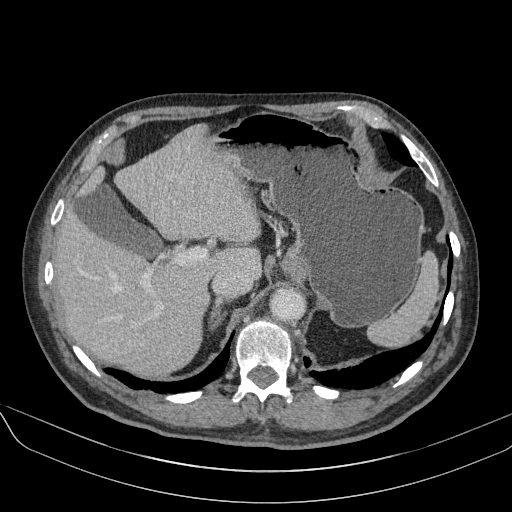
[im 398/521  lung]
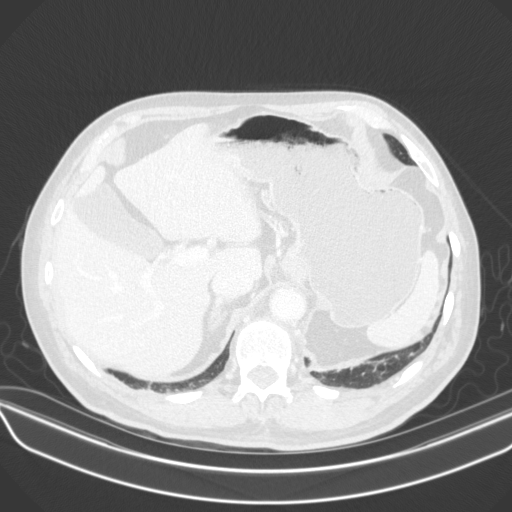
[im 429/521  lung]
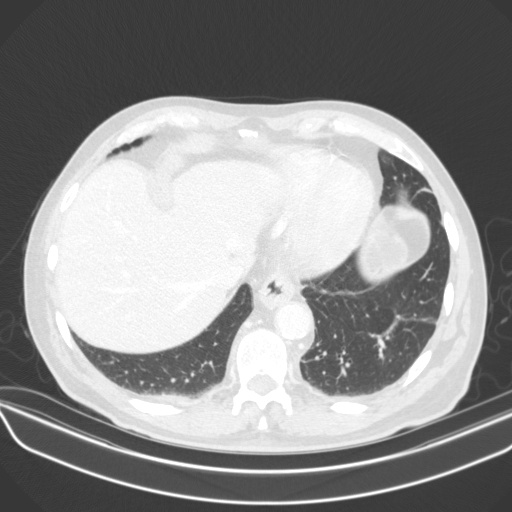
[im 459/521  soft-tissue]
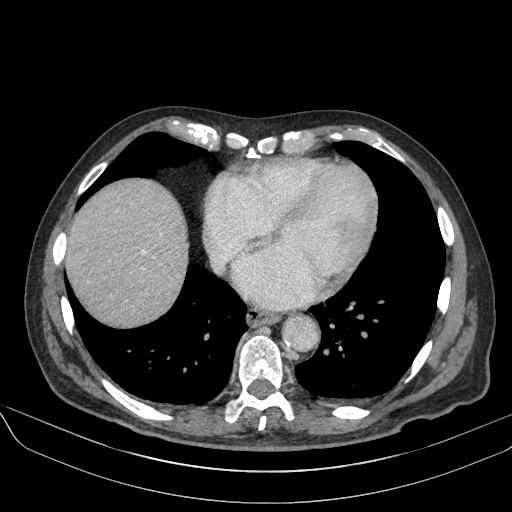
[im 459/521  lung]
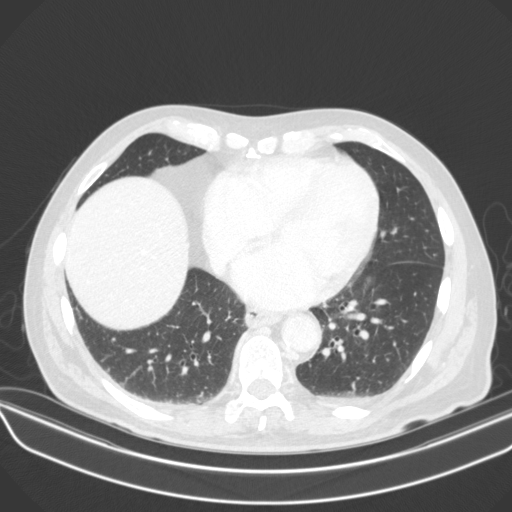
[im 490/521  soft-tissue]
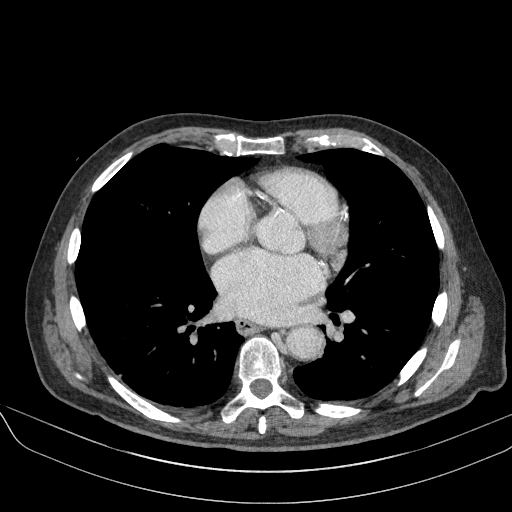
[im 490/521  lung]
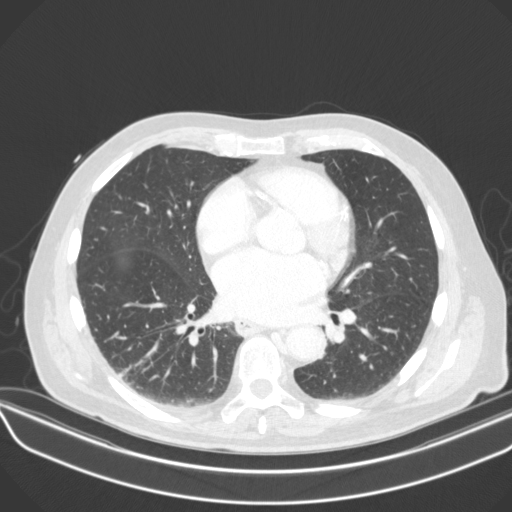

[13 of 32 positions shown; findings below may reference images not displayed]

RADIATION DOSE REDUCTION: This exam was performed according to the
departmental dose-optimization program which includes automated
exposure control, adjustment of the mA and/or kV according to
patient size and/or use of iterative reconstruction technique.

CONTRAST:  100mL [0S] IOPAMIDOL ([0S]) INJECTION 61%
FINDINGS: Lower chest: Scattered subsegmental volume loss. Trace bilateral
pleural effusions. Heart size normal. Atherosclerotic calcification
of the aorta, aortic valve and coronary arteries. No pericardial
effusion. Distal esophagus is grossly unremarkable.

Hepatobiliary: 1.3 cm fluid density cyst in the dome of the liver.
Liver and gallbladder are otherwise unremarkable. No biliary ductal
dilatation.

Pancreas: Negative.

Spleen: Negative.

Adrenals/Urinary Tract: Adrenal glands and right kidney are
unremarkable. Subtle 7 mm isodense lesion in the medial left kidney
(5/195), as on [DATE]. Left kidney is otherwise unremarkable.
Ureters are decompressed. Bladder is grossly unremarkable.

Stomach/Bowel: Stomach, small bowel, appendix and colon are
unremarkable with the exception of an incidentally noted duodenal
diverticulum. Slight rectal wall thickening with perirectal
inflammatory haziness. Fair amount of stool in the colon is
indicative of constipation.

Vascular/Lymphatic: Atherosclerotic calcification of the aorta. No
pathologically enlarged lymph nodes.

Reproductive: Brachytherapy seeds in the prostate.

Other: Small left inguinal hernia contains fat. Mesenteries and
peritoneum are otherwise unremarkable.

Musculoskeletal: Degenerative and postoperative changes in the
spine. Slight compression of the superior endplate of T12, new from
CT lumbar spine [DATE]. Inferior endplate compression fracture
of L1 and superior endplate compression fracture of L2, as on
[DATE].
IMPRESSION: 1. No findings in the small bowel to correspond to reported
endoscopic abnormality.
2. T12 superior endplate compression fracture, new from [DATE].
3. 7 mm hyperdense lesion in the medial interpolar left kidney, as
on [DATE]. Renal cell carcinoma cannot be excluded. Further
evaluation with pre and post contrast MRI should be considered. Pre
and post contrast CT could alternatively be performed, but would
likely be of decreased accuracy given lesion size.
4. Slight rectal wall thickening with perirectal haziness, findings
indicative of proctitis.
5. Trace bilateral pleural effusions.
6. Aortic atherosclerosis ([0S]-[0S]). Coronary artery
calcification.

## 2022-02-24 MED ORDER — IOPAMIDOL (ISOVUE-300) INJECTION 61%
100.0000 mL | Freq: Once | INTRAVENOUS | Status: AC | PRN
  Administered 2022-02-24: 100 mL via INTRAVENOUS

## 2022-03-06 ENCOUNTER — Ambulatory Visit: Payer: Medicare Other | Admitting: Cardiology

## 2022-03-07 ENCOUNTER — Ambulatory Visit: Payer: Medicare Other | Attending: Internal Medicine

## 2022-03-07 ENCOUNTER — Other Ambulatory Visit (HOSPITAL_BASED_OUTPATIENT_CLINIC_OR_DEPARTMENT_OTHER): Payer: Self-pay

## 2022-03-07 DIAGNOSIS — Z23 Encounter for immunization: Secondary | ICD-10-CM

## 2022-03-07 MED ORDER — PFIZER COVID-19 VAC BIVALENT 30 MCG/0.3ML IM SUSP
INTRAMUSCULAR | 0 refills | Status: DC
Start: 2022-03-07 — End: 2022-04-24
  Filled 2022-03-07: qty 0.3, 1d supply, fill #0

## 2022-03-07 NOTE — Progress Notes (Signed)
? ?  [facility] ? ?Name:  Cody Gonzalez    ?MRN: [id number] ?DOB: 24-Aug-1946 ? ?03/07/2022 ? ?Mr. Cody Gonzalez was observed post Covid-19 immunization for 15 minutes without incident. He was provided with Vaccine Information Sheet and instruction to access the V-Safe system.  ? ?Mr. Cody Gonzalez was instructed to call 911 with any severe reactions post vaccine: ?Difficulty breathing  ?Swelling of face and throat  ?A fast heartbeat  ?A bad rash all over body  ?Dizziness and weakness  ? ?Immunizations Administered   ? ? Name Date Dose VIS Date Route  ? Ambulance person Booster 03/07/2022 10:51 AM 0.3 mL 07/03/2021 Intramuscular  ? Manufacturer: [employer]: [id number]  ? [place]: [phone]  ? ?  ? ? ?

## 2022-03-10 ENCOUNTER — Other Ambulatory Visit: Payer: Self-pay | Admitting: Pharmacist

## 2022-03-10 DIAGNOSIS — I10 Essential (primary) hypertension: Secondary | ICD-10-CM

## 2022-03-10 MED ORDER — NIFEDIPINE ER 30 MG PO TB24: 30.0000 mg | ORAL_TABLET | Freq: Every day | ORAL | 2 refills | Status: DC

## 2022-03-10 NOTE — Telephone Encounter (Signed)
BP readings reviewed with pt's wife. BP continues to remain stable and controlled since starting nifedipine 30 mg. Avg BP over the past week of 144/76 HR: 73. Currently still having significant pain back pain. Planning on following up with their pain management provider to adjust his pain therapy/consider further surgical intervention. Will continue current antihypertensive therapy of telmisartan 80 mg, nifedipine 30 mg.  ?

## 2022-03-19 ENCOUNTER — Ambulatory Visit: Payer: Medicare Other | Admitting: Neurology

## 2022-04-02 ENCOUNTER — Encounter (HOSPITAL_BASED_OUTPATIENT_CLINIC_OR_DEPARTMENT_OTHER): Payer: Self-pay

## 2022-04-02 ENCOUNTER — Ambulatory Visit (HOSPITAL_BASED_OUTPATIENT_CLINIC_OR_DEPARTMENT_OTHER)
Admission: RE | Admit: 2022-04-02 | Discharge: 2022-04-02 | Disposition: A | Discharge status: Home / Self Care | Payer: Medicare Other | Source: Ambulatory Visit | Attending: Physician Assistant | Admitting: Physician Assistant

## 2022-04-02 ENCOUNTER — Other Ambulatory Visit (HOSPITAL_BASED_OUTPATIENT_CLINIC_OR_DEPARTMENT_OTHER): Payer: Self-pay | Admitting: Physician Assistant

## 2022-04-02 ENCOUNTER — Ambulatory Visit
Admission: EM | Admit: 2022-04-02 | Discharge: 2022-04-02 | Disposition: A | Discharge status: Home / Self Care | Payer: Medicare Other

## 2022-04-02 ENCOUNTER — Ambulatory Visit (INDEPENDENT_AMBULATORY_CARE_PROVIDER_SITE_OTHER): Payer: Medicare Other

## 2022-04-02 ENCOUNTER — Encounter: Payer: Self-pay | Admitting: Emergency Medicine

## 2022-04-02 DIAGNOSIS — M48061 Spinal stenosis, lumbar region without neurogenic claudication: Secondary | ICD-10-CM | POA: Diagnosis not present

## 2022-04-02 DIAGNOSIS — M545 Low back pain, unspecified: Secondary | ICD-10-CM | POA: Insufficient documentation

## 2022-04-02 DIAGNOSIS — M4856XA Collapsed vertebra, not elsewhere classified, lumbar region, initial encounter for fracture: Secondary | ICD-10-CM | POA: Diagnosis not present

## 2022-04-02 DIAGNOSIS — I2510 Atherosclerotic heart disease of native coronary artery without angina pectoris: Secondary | ICD-10-CM | POA: Diagnosis not present

## 2022-04-02 DIAGNOSIS — G8929 Other chronic pain: Secondary | ICD-10-CM | POA: Diagnosis not present

## 2022-04-02 DIAGNOSIS — M4310 Spondylolisthesis, site unspecified: Secondary | ICD-10-CM

## 2022-04-02 DIAGNOSIS — M4150 Other secondary scoliosis, site unspecified: Secondary | ICD-10-CM

## 2022-04-02 DIAGNOSIS — I700 Atherosclerosis of aorta: Secondary | ICD-10-CM | POA: Insufficient documentation

## 2022-04-02 DIAGNOSIS — J90 Pleural effusion, not elsewhere classified: Secondary | ICD-10-CM | POA: Insufficient documentation

## 2022-04-02 IMAGING — CT CT L SPINE W/O CM
3 series · 10 of 33 positions shown, 12 images · non-contrast
Comparison: Lumbar spine radiographs [DATE] and [DATE]

CLINICAL DATA: Persistent back pain.



[Series 4: l-spine wo soft tissue (person_name) · axial · 0.36mm/px · z∈[-467,-343]mm · 2 of 135 slices shown, 3 images]
[im 42/135  soft-tissue]
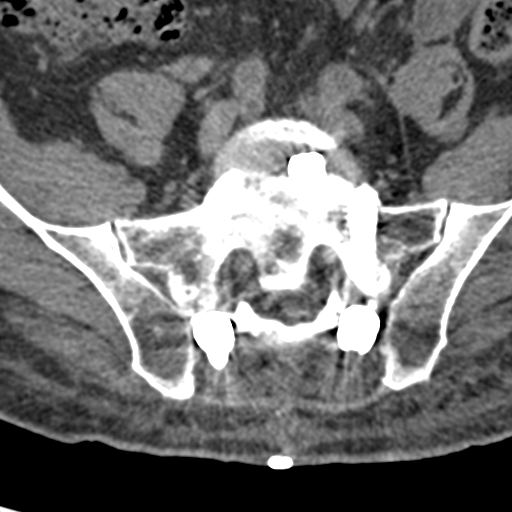
[im 42/135  bone]
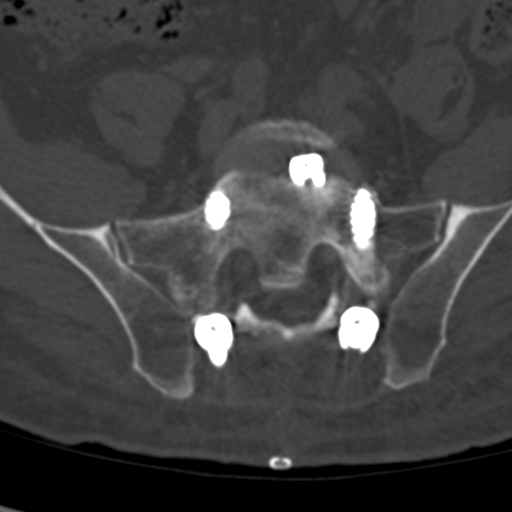
[im 104/135  bone]
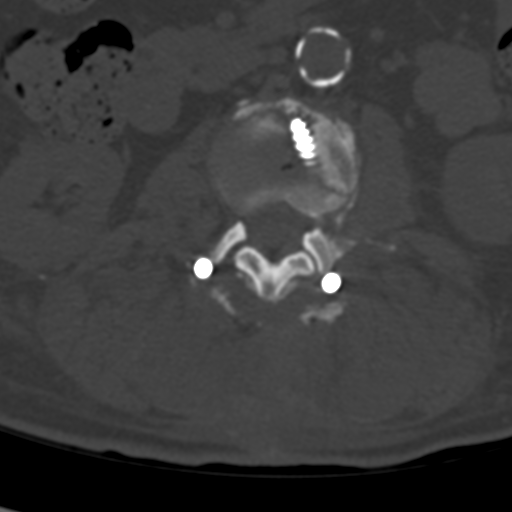

[Series 5: coronal bone · coronal · 0.34mm/px · 3 of 71 slices shown]
[im 15/71  bone]
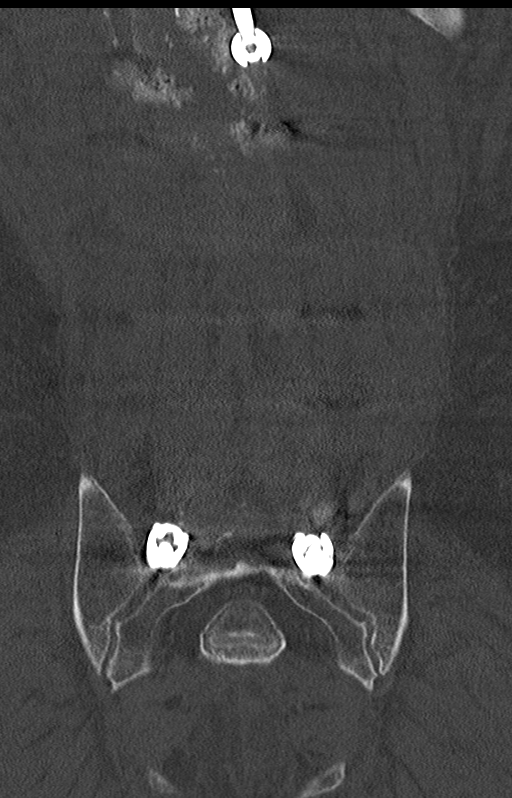
[im 29/71  bone]
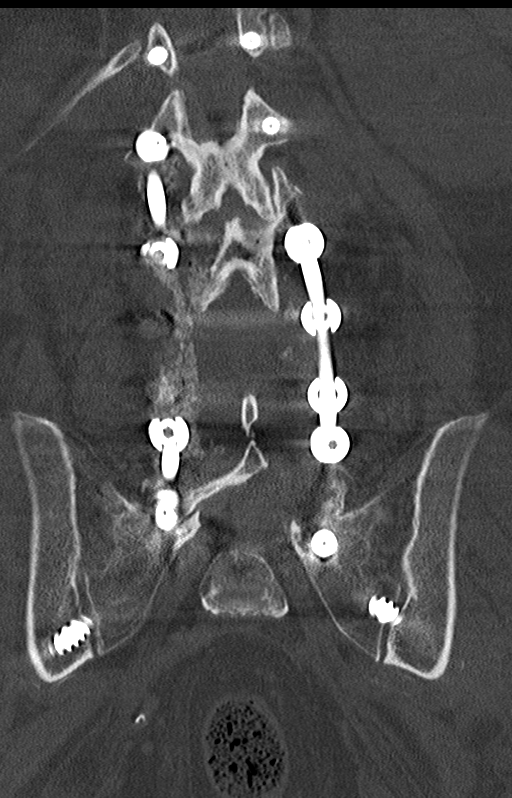
[im 43/71  bone]
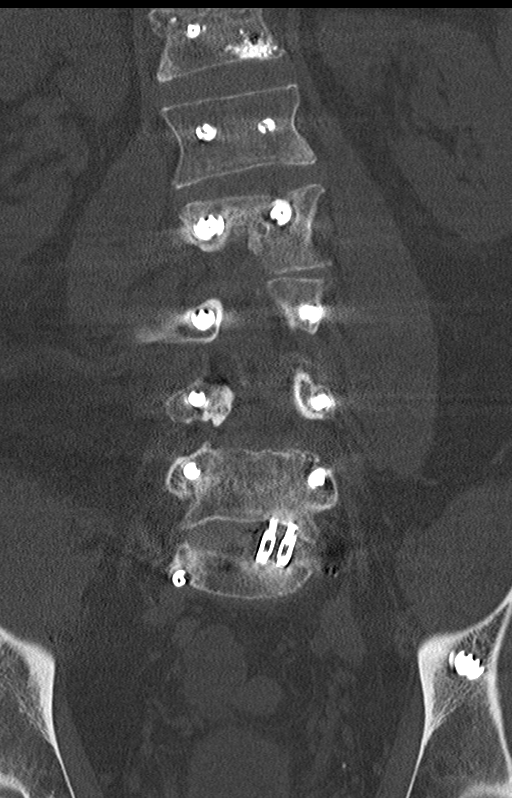

[Series 6: sagittal bone · sagittal · 0.28mm/px · 5 of 87 slices shown, 6 images]
[im 29/87  bone]
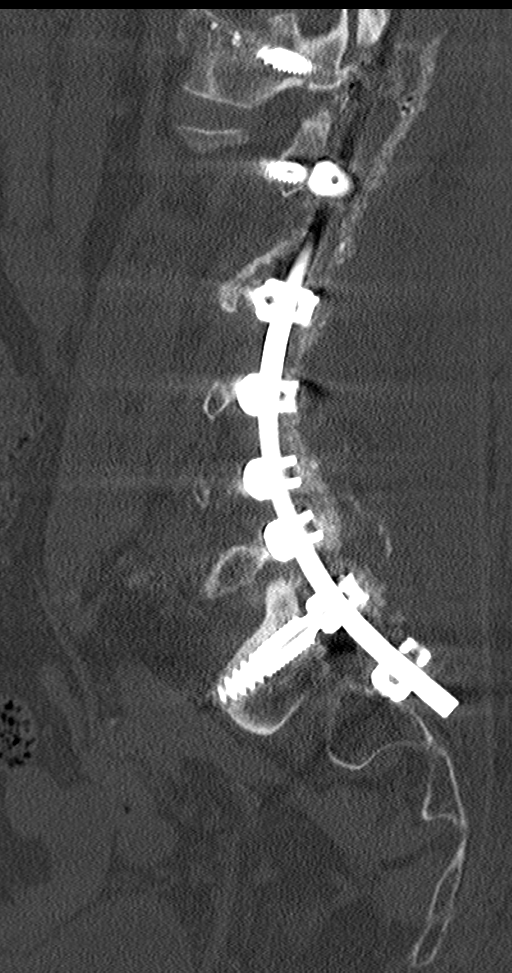
[im 36/87  bone]
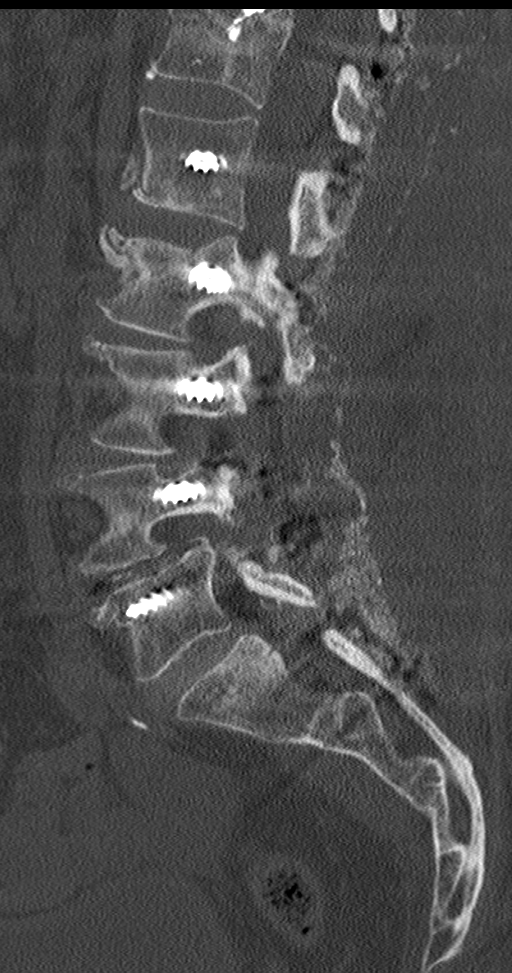
[im 44/87  soft-tissue]
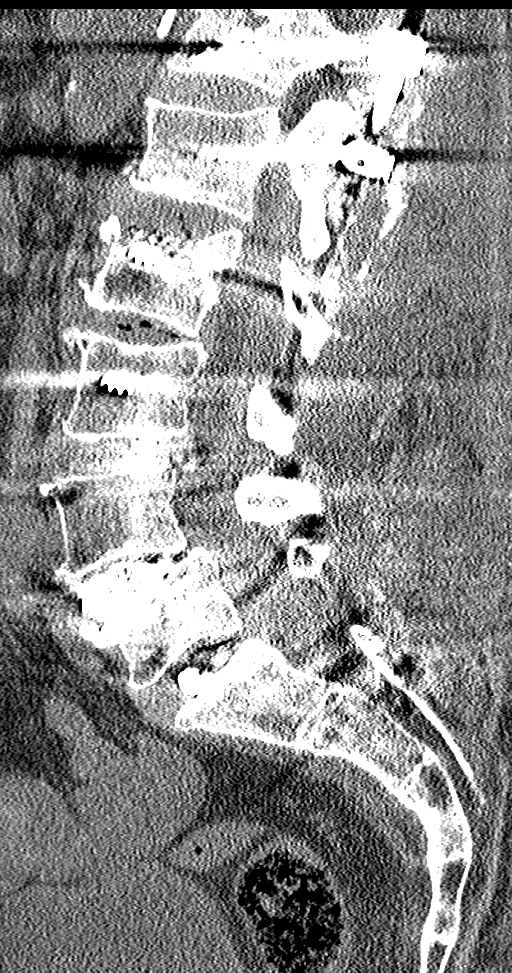
[im 44/87  bone]
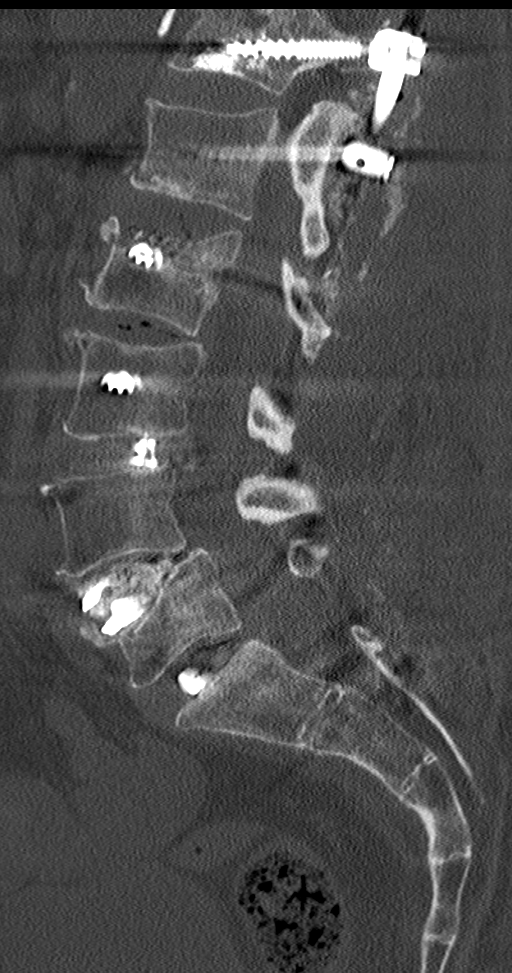
[im 51/87  bone]
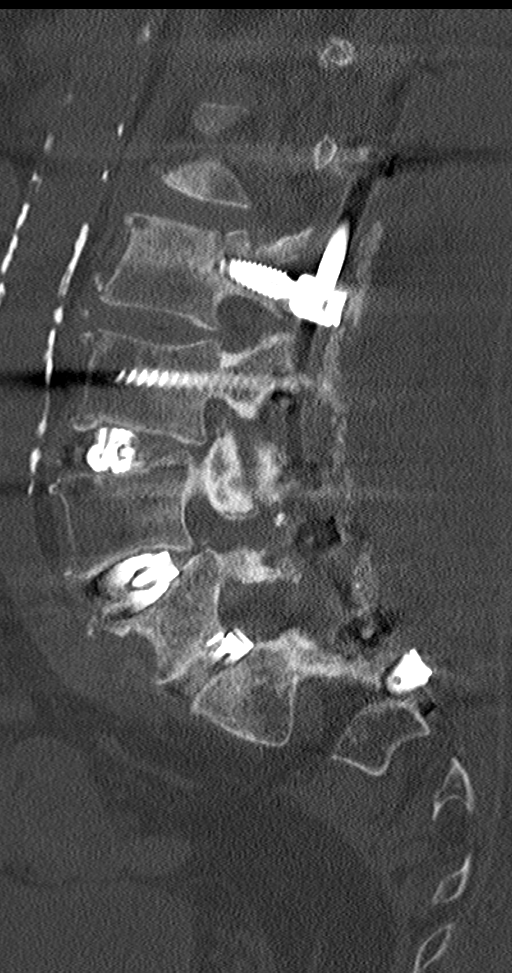
[im 58/87  bone]
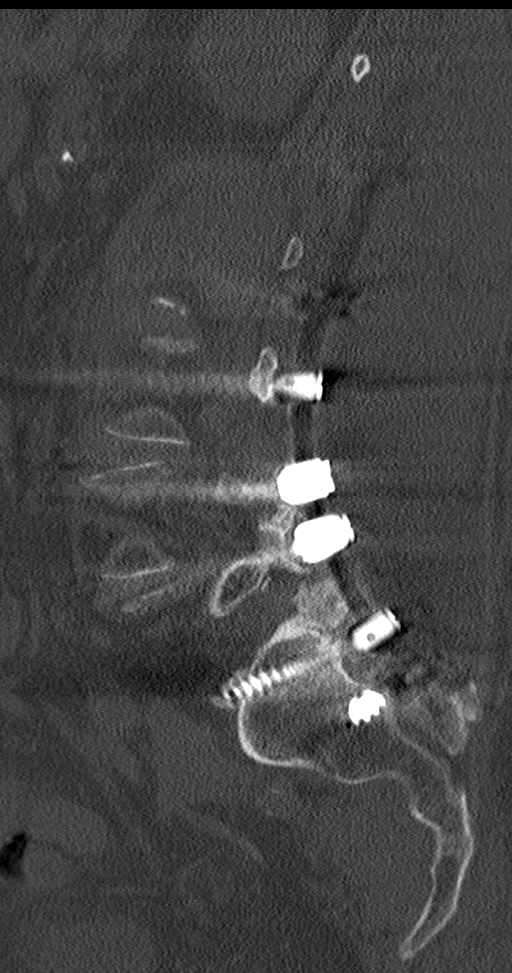

[10 of 33 positions shown; findings below may reference images not displayed]

FINDINGS: Segmentation: 5 non rib-bearing lumbar type vertebral bodies are
present. The lowest fully formed vertebral body is L5.

Alignment: Grade 1 anterolisthesis at L4-5 is stable. Slight
anterolisthesis is now present at L2-3. Leftward curvature is
centered at L3-4.

Vertebrae: Further collapse of the superior endplate noted at L2.
The left pedicle screw now extends into the disc space with lucency
about the left pedicle screw. Subsidence into the superior endplate
of L5 is stable. Superior endplate Schmorl's nodes at L3 are stable.
Inferior endplate sclerosis at L1 is new.

Methylmethacrylate is noted within the T12 vertebral body
inferiorly.

Paraspinal and other soft tissues: Atherosclerotic calcifications
are present in the aorta and branch vessels. Skin staples are in
place. Edema is present in the subcutaneous soft tissues in
paraspinous muscular tissues posteriorly.

Disc levels: T12-L1: Posterior hardware is in place. Laminectomy
noted. No significant stenosis.

L1-2: Mild broad-based disc protrusion is asymmetric to the left.
Central canal and foramina are patent. Partial laminectomy noted.

L2-3: Wide laminectomy and bilateral foraminotomies present. No
significant stenosis.

L3-4: Right laminectomy and foraminotomy stable. No residual or
recurrent stenosis.

L4-5: Bilateral laminectomies and foraminotomies present. No
residual recurrent stenosis.

L5-S1: Wide laminectomy is present. Fusion present across the disc
space. No residual or recurrent stenosis.
IMPRESSION: 1. Further collapse of the superior endplate at L2 with left pedicle
screw extending into the disc space and lucency about the left
pedicle screw.
2. Extension of posterior hardware into the thoracic spine.
3. Stable fusion and hardware at L3-4, L4-5 and L5-S1.
4. Aortic Atherosclerosis ([8V]-[8V]).

## 2022-04-02 IMAGING — DX DG LUMBAR SPINE COMPLETE 4+V
4 series · 4 of 4 positions shown · non-contrast
Comparison: [DATE]

CLINICAL DATA: Recent spinal fusion. Bent over today with popping
sound and severe pain.

EXAM:
LUMBAR SPINE - COMPLETE 4+ VIEW

[lumbar spine ap]
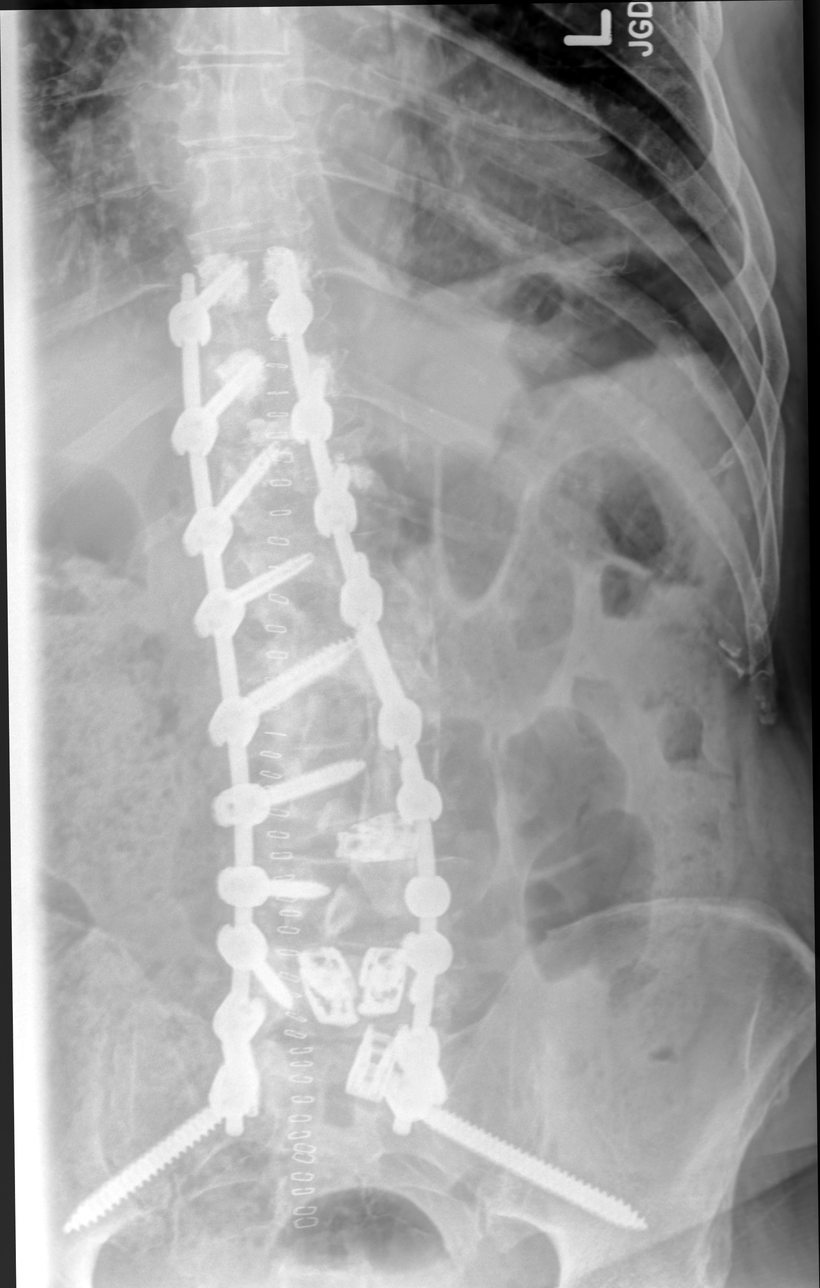

[lumbar oblique (1 of 2)]
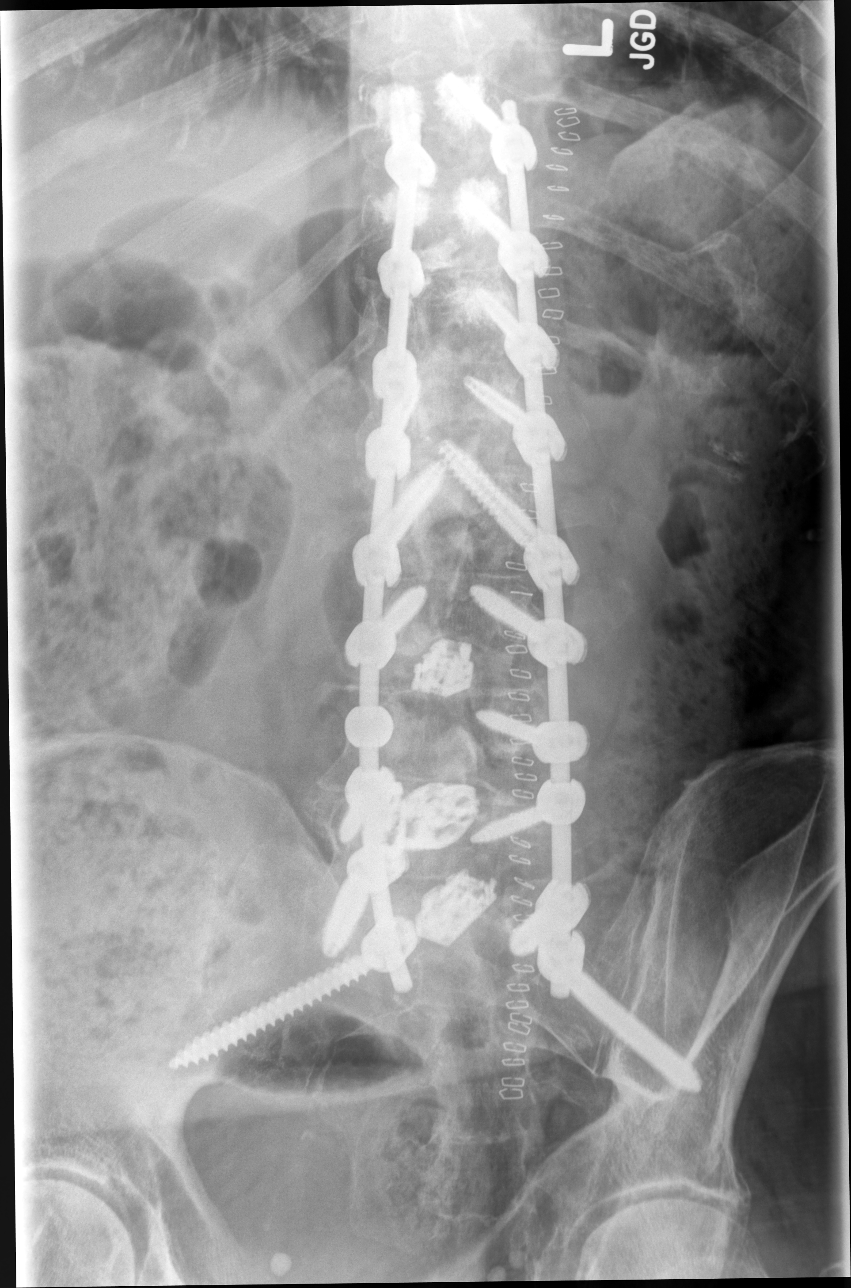

[lumbar oblique (2 of 2)]
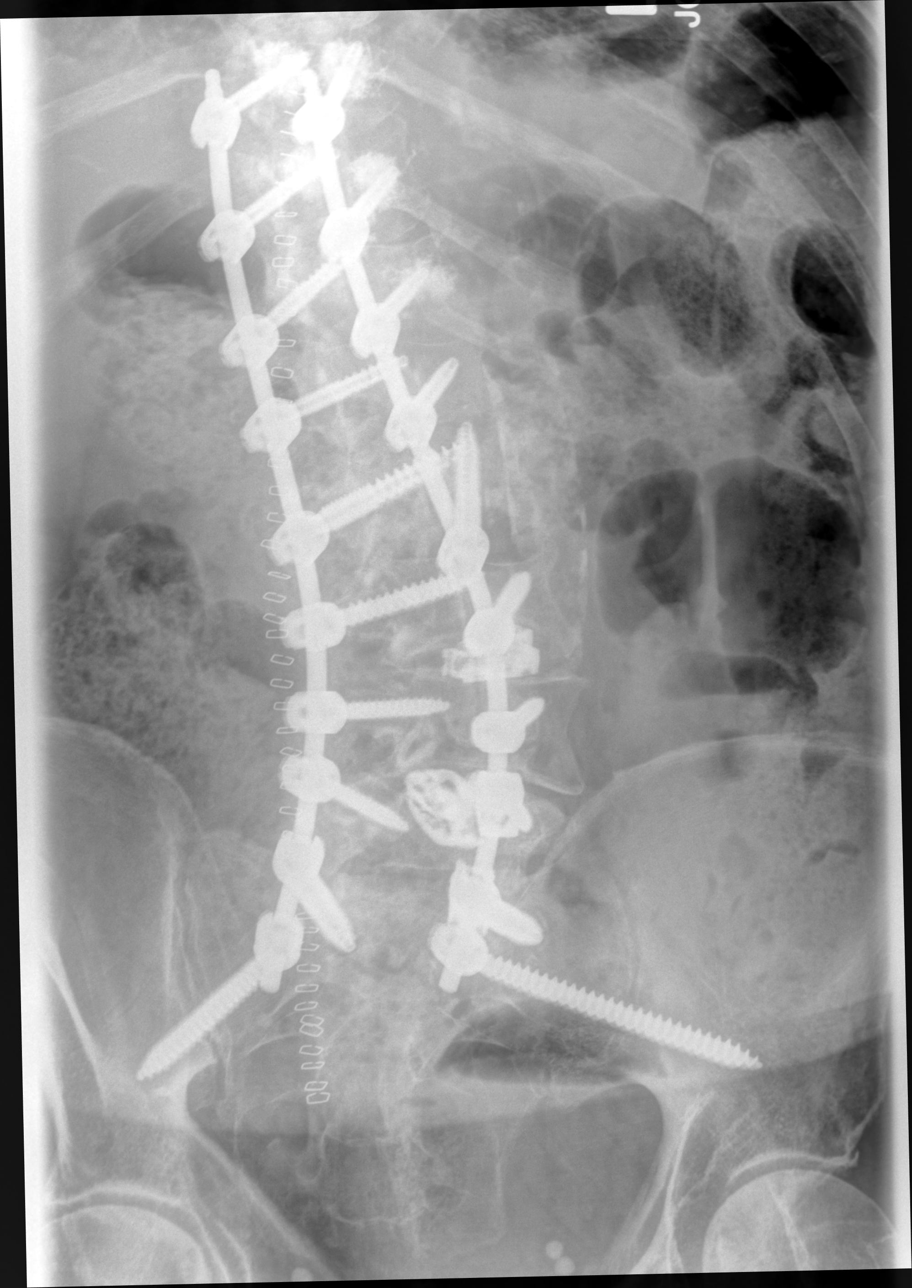

[lumbar spine lat]
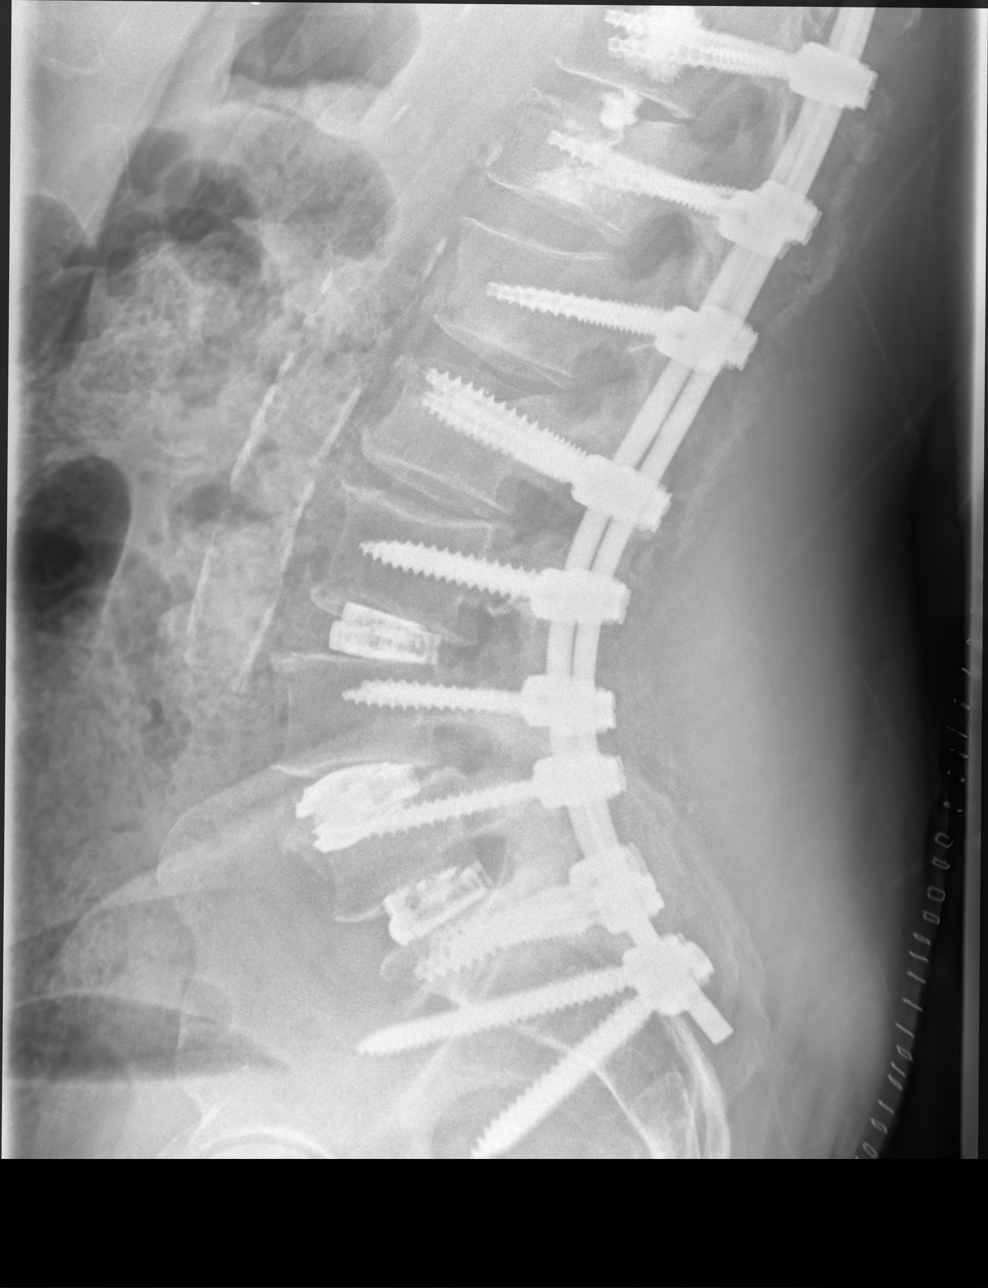

[4 of 4 positions shown; findings below may reference images not displayed]

FINDINGS: Interval extension of spinal fusion. Pedicle screws and posterior
rods extending from T10 to S1, with bilateral sacroiliac extensions.
Augmentation of the T10, T11 and T12 vertebral bodies. There is no
evidence of hardware fracture or dissociation. No evidence of
regional fracture. Lower lumbar interbody spacers appear unchanged.
IMPRESSION: Postoperative findings as above. No acute finding or hardware
complication evident.

## 2022-04-02 IMAGING — CT CT T SPINE W/O CM
2 of 3 series · 11 of 33 positions shown, 13 images · non-contrast
Comparison: CT of the lumbar spine [DATE] two-view chest x-ray
[DATE]

CLINICAL DATA: Continued back pain.  Surgery 6 months ago.

EXAM:
CT THORACIC SPINE WITHOUT CONTRAST
TECHNIQUE: Multidetector CT images of the thoracic were obtained using the
standard protocol without intravenous contrast.
RADIATION DOSE REDUCTION: This exam was performed according to the
departmental dose-optimization program which includes automated
exposure control, adjustment of the mA and/or kV according to
patient size and/or use of iterative reconstruction technique.

[Series 4: t spine soft (person_name) · axial · 0.40mm/px · z∈[-301,-15]mm · 8 of 169 slices shown, 10 images]
[im 13/169  soft-tissue]
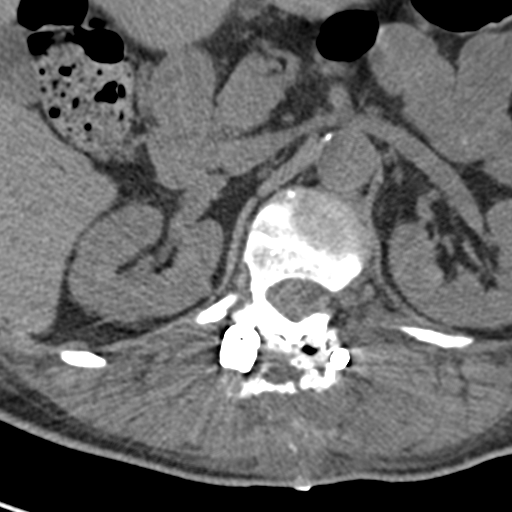
[im 13/169  bone]
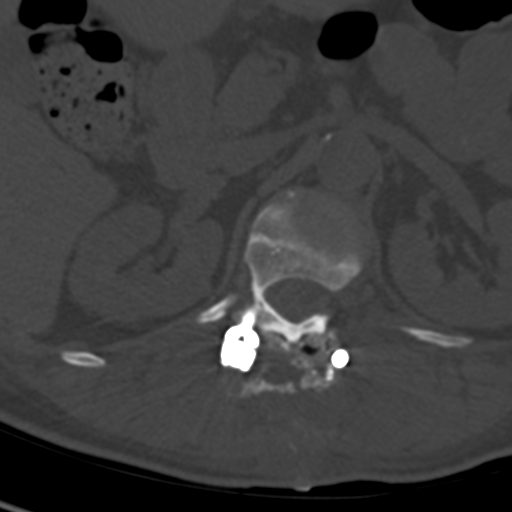
[im 39/169  bone]
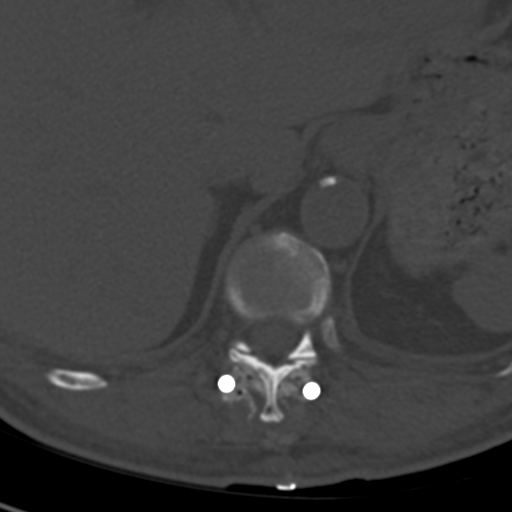
[im 52/169  bone]
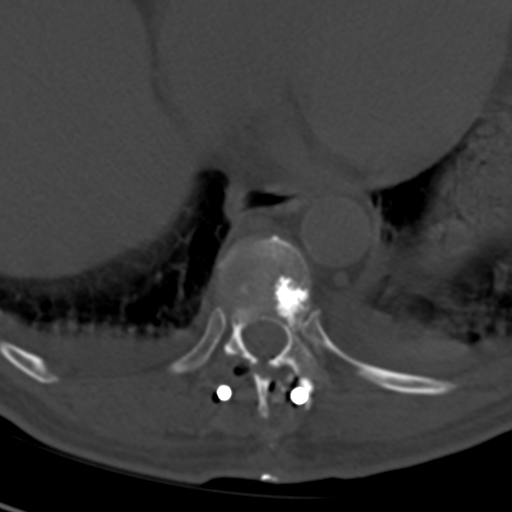
[im 78/169  bone]
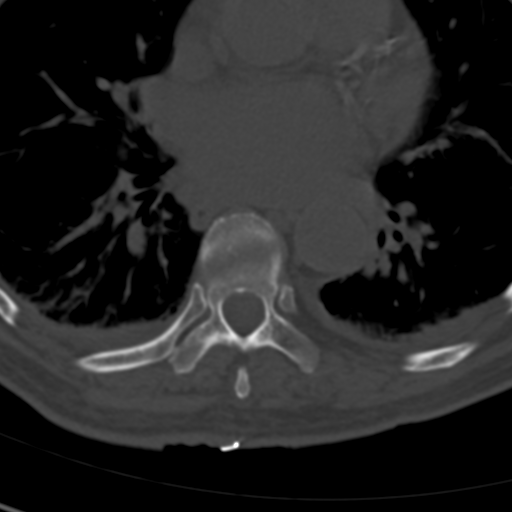
[im 91/169  soft-tissue]
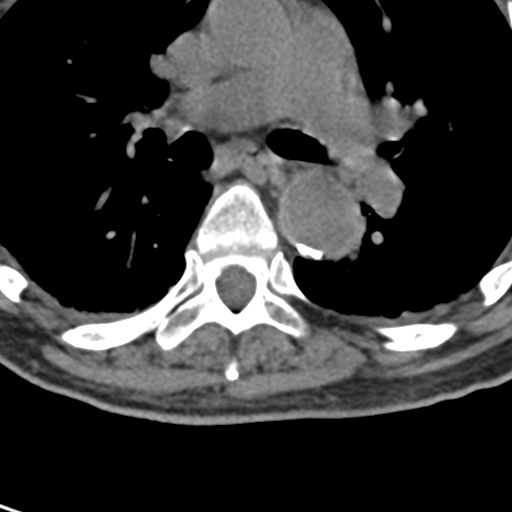
[im 91/169  bone]
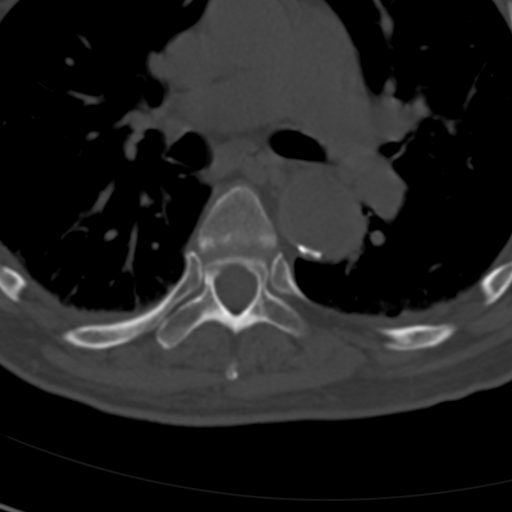
[im 117/169  bone]
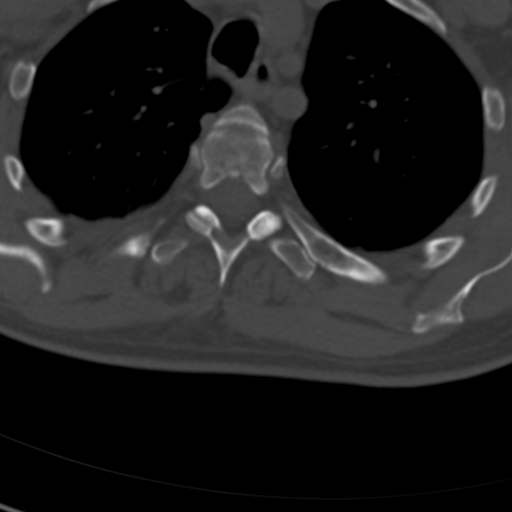
[im 130/169  bone]
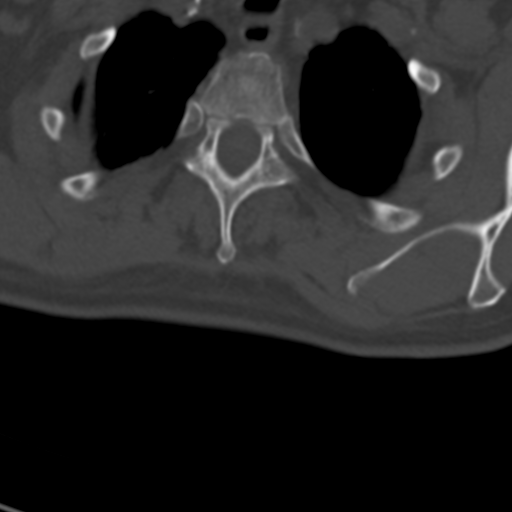
[im 156/169  bone]
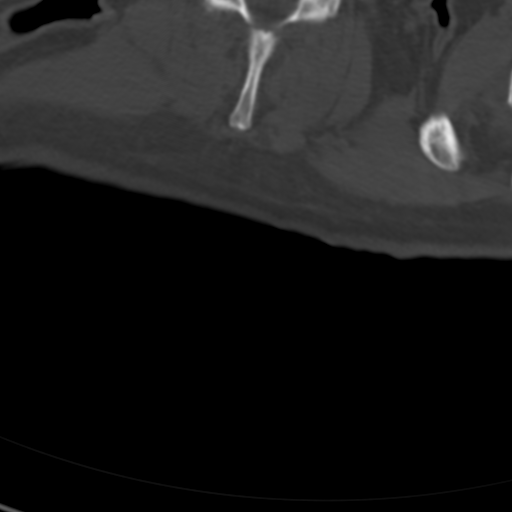

[Series 5: cor bone · coronal · 0.27mm/px · 3 of 84 slices shown]
[im 17/84  bone]
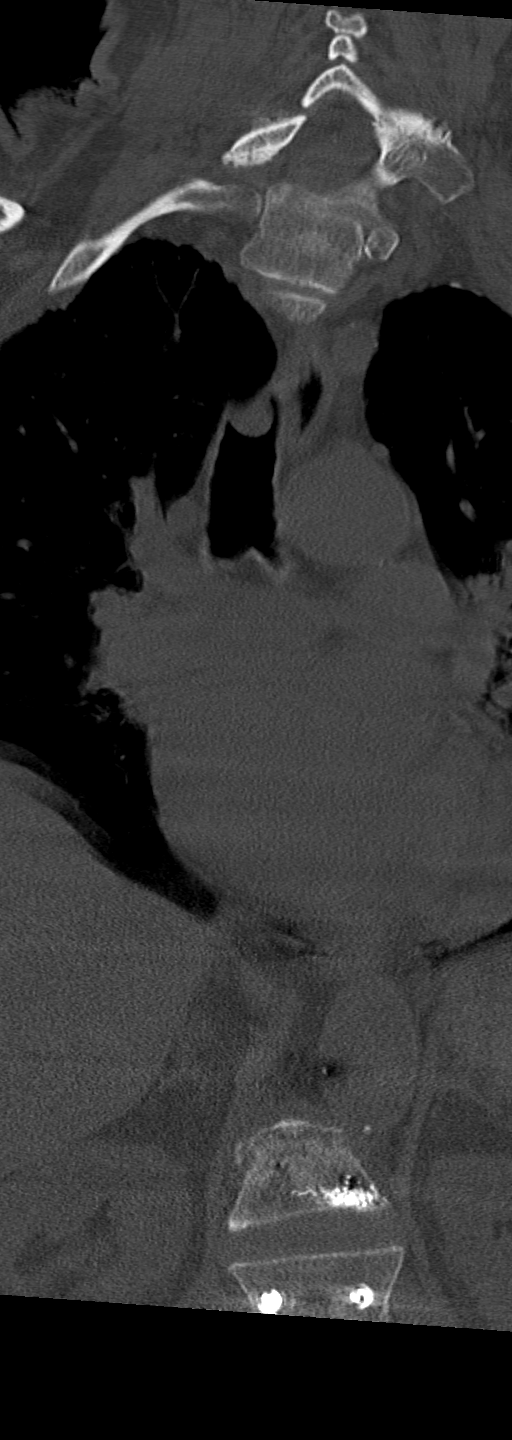
[im 34/84  bone]
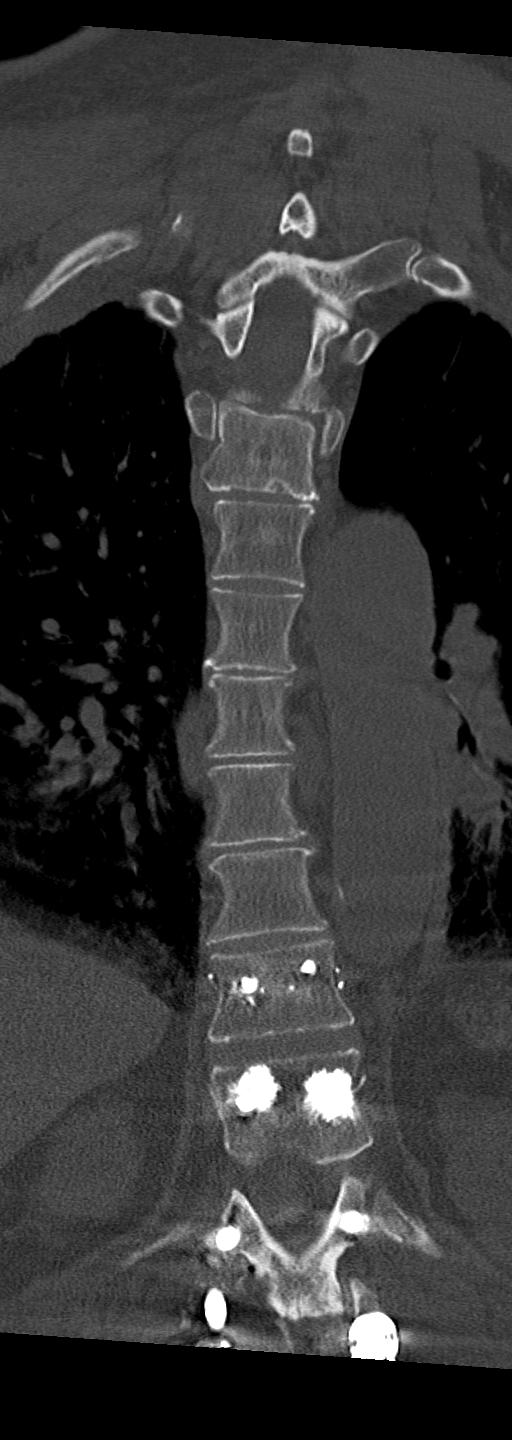
[im 50/84  bone]
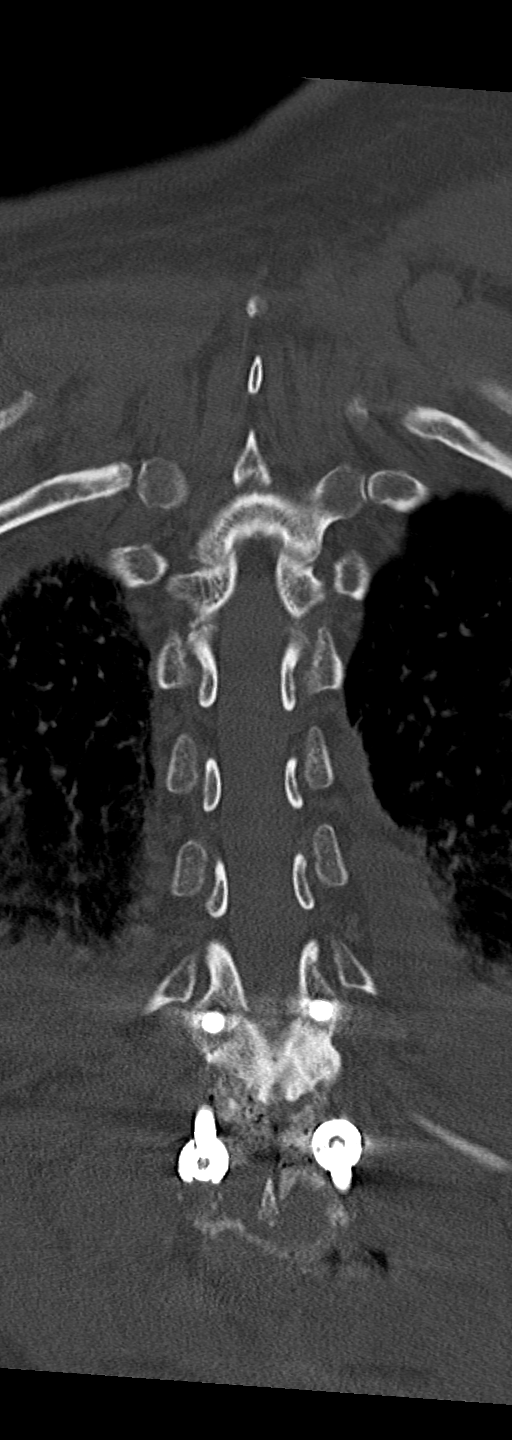

[11 of 33 positions shown; findings below may reference images not displayed]

FINDINGS: Alignment: Slight retrolisthesis is present at T11-12. No other
significant listhesis is present. Mild rightward curvature is
centered in the midthoracic spine.

Vertebrae: Superior endplate fracture at T12 has progressed. Patient
is status post spinal augmentation at T12. Methylmethacrylate
extends into the disc space into the inferior endplate of T11. No
inferior T11 fracture is present. Spinal augmentation noted
bilaterally at T10 and T11. No other acute fractures are present.

Paraspinal and other soft tissues: Heart is mildly enlarged.
Coronary artery calcifications are present. Left greater than right
pleural effusion is noted. Dependent atelectasis is present
bilaterally. No nodule or other mass lesion is present.

Disc levels: No significant focal disc protrusion or stenosis is
present. The foramina are patent bilaterally.
IMPRESSION: 1. Progressive superior endplate fracture at T12. Spinal
augmentation at T12 extends into the T11-12 disc space and extends
to the inferior endplate of T11. No inferior T11 fracture.
2. Spinal augmentation at T10 and T11.
3. No significant stenosis.
4. Left greater than right pleural effusions and associated
dependent atelectasis.
5. Coronary artery disease.

## 2022-04-02 NOTE — ED Provider Notes (Signed)
[place]   MRN: [id number] DOB: 03-Apr-1946  Subjective:   Cody Gonzalez is a 76 y.o. male presenting for an x-ray of the low back.  Patient had fusion of the lumbar spine 03/28/2022.  Yesterday he started to have acute onset recurrent, heard a pop.  They contacted his orthopedic spine surgeon and they advised that he come in to an urgent care or ED to get an x-ray before they could consult on what else needed to be done.  Has not lost control of bowel function, no incontinence.  He is still able to urinate and defecate.  He is able to stand for limited and mostly uses a walker to ambulate which is unchanged.  No saddle paresthesia.  No current facility-administered medications for this encounter.  Current Outpatient Medications:    acetaminophen (TYLENOL) 325 MG tablet, Take 2 tablets (650 mg total) by mouth every 6 (six) hours as needed for mild pain (or Fever >/= 101)., Disp: , Rfl:    alfuzosin (UROXATRAL) 10 MG 24 hr tablet, Take 1 tablet by mouth daily at 6 (six) AM., Disp: , Rfl:    atorvastatin (LIPITOR) 40 MG tablet, Take 40 mg by mouth at bedtime., Disp: , Rfl:    b complex vitamins capsule, Take 1 capsule by mouth daily., Disp: , Rfl:    CALCIUM PO, Take 1,200 tablets by mouth daily., Disp: , Rfl:    COVID-19 mRNA bivalent vaccine, Pfizer, (PFIZER COVID-19 VAC BIVALENT) injection, Inject into the muscle., Disp: 0.3 mL, Rfl: 0   doxycycline (VIBRAMYCIN) 100 MG capsule, Take 100 mg by mouth 2 (two) times daily., Disp: , Rfl:    ferrous sulfate 325 (65 FE) MG tablet, Take 325 mg by mouth at bedtime., Disp: , Rfl:    HYDROcodone-acetaminophen (NORCO/VICODIN) 5-325 MG tablet, Take by mouth., Disp: , Rfl:    Lidocaine 4 % PTCH, Apply 2 patches topically daily., Disp: , Rfl:    Multiple Vitamins-Iron (MULTIVITAMIN/IRON PO), Take 1 tablet by mouth daily with breakfast., Disp: , Rfl:    NIFEdipine (ADALAT CC) 30 MG 24 hr tablet, Take 1 tablet (30 mg total)  by mouth daily., Disp: 90 tablet, Rfl: 2   telmisartan (MICARDIS) 80 MG tablet, Take 80 mg by mouth daily., Disp: , Rfl:    vitamin C (ASCORBIC ACID) 500 MG tablet, Take 500 mg by mouth daily., Disp: , Rfl:    zinc gluconate 50 MG tablet, Take 50 mg by mouth daily. X 14 days, will end on 01-06-22, Disp: , Rfl:    Allergies  Allergen Reactions   Amlodipine Besylate Swelling and Other (See Comments)    Ankles swell    Past Medical History:  Diagnosis Date   Anemia    Hypertension    Prostate cancer ([place])    Sleep apnea    uses cpap    TBI (traumatic brain injury) ([place]) 2015     Past Surgical History:  Procedure Laterality Date   [place]  2014   CYSTOSCOPY N/A 06/29/2019   Procedure: CYSTOSCOPY;  Surgeon: Alexis Frock, MD;  Location: [facility];  Service: Urology;  Laterality: N/A;  No seeds in bladder per Dr. Lilli Light LAMINECTOMY/DECOMPRESSION MICRODISCECTOMY Left 05/28/2021   Procedure: Left  - Lumbar five-Sacral one Laminectomy resection of synovial cyst;  Surgeon: Earnie Larsson, MD;  Location: [facility];  Service: Neurosurgery;  Laterality: Left;   PROSTATE BIOPSY  RADIOACTIVE SEED IMPLANT N/A 06/29/2019   Procedure: RADIOACTIVE SEED IMPLANT/BRACHYTHERAPY IMPLANT;  Surgeon: Alexis Frock, MD;  Location: [facility];  Service: Urology;  Laterality: N/A;  [place] N/A 06/29/2019   Procedure: SPACE OAR INSTILLATION;  Surgeon: Alexis Frock, MD;  Location: [facility];  Service: Urology;  Laterality: N/A;    Family History  Problem Relation Age of Onset   Brain cancer Father        [facility]    Social History   Tobacco Use   Smoking status: Never   Smokeless tobacco: Never  Vaping Use   Vaping Use: Never used  Substance Use Topics   Alcohol use: Yes    Alcohol/week: 2.0 standard drinks    Types: 2 Cans of beer per week    Comment: 2 beers daily   Drug  use: No    ROS   Objective:   Vitals: BP 138/68   Pulse 63   Temp 98 F (36.7 C)   Resp 20   SpO2 98%   Physical Exam Constitutional:      General: He is not in acute distress.    Appearance: Normal appearance. He is well-developed and normal weight. He is not ill-appearing, toxic-appearing or diaphoretic.  HENT:     Head: Normocephalic and atraumatic.     Right Ear: External ear normal.     Left Ear: External ear normal.     Nose: Nose normal.  Eyes:     General: No scleral icterus.       Right eye: No discharge.        Left eye: No discharge.     Extraocular Movements: Extraocular movements intact.  Cardiovascular:     Rate and Rhythm: Normal rate.  Pulmonary:     Effort: Pulmonary effort is normal.  Musculoskeletal:     Cervical back: Normal range of motion.  Neurological:     Mental Status: He is alert and oriented to person, place, and time.     Motor: Weakness (not new) present.     Coordination: Coordination abnormal (not new).     Gait: Gait abnormal (not new).     Comments: Ambulates using a walker.  Patient is wearing a back brace.    MR Lumbar spine w wo contrast IMPRESSION: 1. Interval near complete resolution of previously seen epidural/intradural collection status post surgical evacuation. Probable small residual component within the dorsal epidural space at the level of L1-2. No more than mild residual canal and lateral recess stenosis at the levels of L1-2 through L3-4 as above. 2. Extensive postoperative changes throughout the posterior paraspinous soft tissues with small residual collections at the laminectomy site of L2-3 and along the lower midline incision. These are favored to reflect benign/sterile postoperative collections, although correlation with physical exam is recommended. No other evidence for new or progressive infection elsewhere within the lumbar spine. 3. Compression fractures involving the inferior endplate of L1  and superior endplate of L2, better seen on prior CT performed earlier the same day. 4. Mild clumping of the nerve roots of the cauda equina at the level of L5, suggesting arachnoiditis.     Electronically Signed   By: Jeannine Boga M.D.   On: 02/10/2022 19:42  Assessment and Plan :   PDMP not reviewed this encounter.  1. Acute midline low back pain, unspecified whether sciatica present   2. Degenerative spondylolisthesis   3. Spinal stenosis of lumbar region, unspecified whether  neurogenic claudication present   4. Chronic midline low back pain, unspecified whether sciatica present    Patient is primarily here for imaging only.  He already has his chronic pain medicine which they can use for his back pain.  They plan on following up closely with his spinal surgeon. X-ray over-read was pending at time of discharge, recommended follow up with only abnormal results through mychart. Otherwise will not call for negative over-read. Patient and his wife were in agreement.    Jaynee Eagles, PA-C 04/02/22 [id number]

## 2022-04-02 NOTE — ED Triage Notes (Signed)
Pt here post spinal fusion yesterday with a pop in his back this morning and immediate and constant 10/10 pain with '10mg'$  oxycodone. Pt was d/c'd from [facility] and needs X-Ray to be able to follow-up with them to see if travel there is necessary.

## 2022-04-05 ENCOUNTER — Encounter (HOSPITAL_COMMUNITY): Payer: Self-pay | Admitting: Emergency Medicine

## 2022-04-05 ENCOUNTER — Other Ambulatory Visit: Payer: Self-pay

## 2022-04-05 ENCOUNTER — Emergency Department (HOSPITAL_COMMUNITY): Payer: Medicare Other

## 2022-04-05 ENCOUNTER — Emergency Department (HOSPITAL_COMMUNITY)
Admission: EM | Admit: 2022-04-05 | Discharge: 2022-04-06 | Disposition: A | Discharge status: Home / Self Care | Payer: Medicare Other | Attending: Emergency Medicine | Admitting: Emergency Medicine

## 2022-04-05 DIAGNOSIS — I10 Essential (primary) hypertension: Secondary | ICD-10-CM | POA: Insufficient documentation

## 2022-04-05 DIAGNOSIS — D649 Anemia, unspecified: Secondary | ICD-10-CM | POA: Diagnosis not present

## 2022-04-05 DIAGNOSIS — G8918 Other acute postprocedural pain: Secondary | ICD-10-CM

## 2022-04-05 DIAGNOSIS — Z79899 Other long term (current) drug therapy: Secondary | ICD-10-CM | POA: Insufficient documentation

## 2022-04-05 DIAGNOSIS — Z7982 Long term (current) use of aspirin: Secondary | ICD-10-CM | POA: Diagnosis not present

## 2022-04-05 DIAGNOSIS — M545 Low back pain, unspecified: Secondary | ICD-10-CM | POA: Diagnosis not present

## 2022-04-05 LAB — CBC
HCT: 29.4 % — ABNORMAL LOW (ref 39.0–52.0)
Hemoglobin: 10.1 g/dL — ABNORMAL LOW (ref 13.0–17.0)
MCH: 31.7 pg (ref 26.0–34.0)
MCHC: 34.4 g/dL (ref 30.0–36.0)
MCV: 92.2 fL (ref 80.0–100.0)
Platelets: 305 10*3/uL (ref 150–400)
RBC: 3.19 MIL/uL — ABNORMAL LOW (ref 4.22–5.81)
RDW: 14.1 % (ref 11.5–15.5)
WBC: 6.8 10*3/uL (ref 4.0–10.5)
nRBC: 0 % (ref 0.0–0.2)

## 2022-04-05 LAB — BASIC METABOLIC PANEL
Anion gap: 9 (ref 5–15)
BUN: 12 mg/dL (ref 8–23)
CO2: 26 mmol/L (ref 22–32)
Calcium: 8.6 mg/dL — ABNORMAL LOW (ref 8.9–10.3)
Chloride: 87 mmol/L — ABNORMAL LOW (ref 98–111)
Creatinine, Ser: 0.63 mg/dL (ref 0.61–1.24)
GFR, Estimated: >60 mL/min (ref >60)
Glucose, Bld: 119 mg/dL — ABNORMAL HIGH (ref 70–99)
Potassium: 4 mmol/L (ref 3.5–5.1)
Sodium: 122 mmol/L — ABNORMAL LOW (ref 135–145)

## 2022-04-05 LAB — SEDIMENTATION RATE: Sed Rate: 65 mm/hr — ABNORMAL HIGH (ref 0–16)

## 2022-04-05 LAB — C-REACTIVE PROTEIN: CRP: 2.7 mg/dL — ABNORMAL HIGH (ref <1.0)

## 2022-04-05 LAB — LACTIC ACID, PLASMA: Lactic Acid, Venous: 1.1 mmol/L (ref 0.5–1.9)

## 2022-04-05 IMAGING — MR MR THORACIC SPINE WO/W CM
5 of 9 series · 19 of 48 positions shown · IV contrast (gadavist)
Comparison: Prior CT from [DATE].

CLINICAL DATA: Initial evaluation for suspected epidural abscess.

EXAM:
MRI THORACIC AND LUMBAR SPINE WITHOUT AND WITH CONTRAST
TECHNIQUE: Multiplanar and multiecho pulse sequences of the thoracic and lumbar
spine were obtained without and with intravenous contrast.
CONTRAST:  7.3mL GADAVIST GADOBUTROL 1 MMOL/ML IV SOLN

[Series 18: T1 · sagittal · 3.3mm · 0.62mm/px · 1 of 9 slices shown (1 of 3)]
[im 1/9]
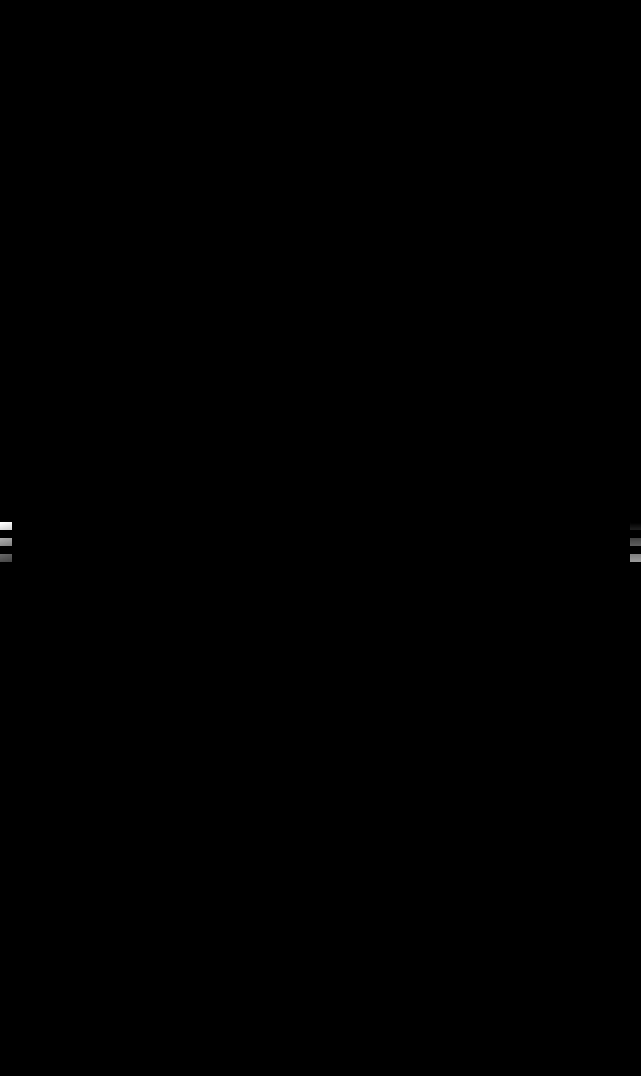

[Series 19: T2 · sagittal · 3.0mm · 0.76mm/px · 3 of 17 slices shown (1 of 2)]
[im 1/17]
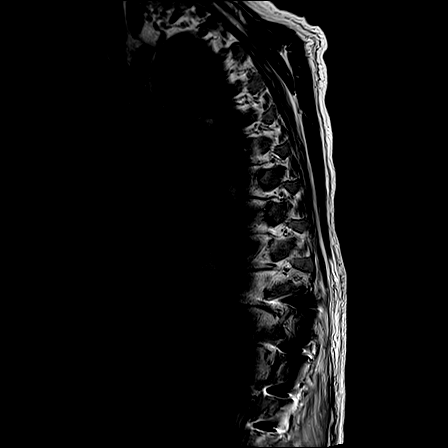
[im 9/17]
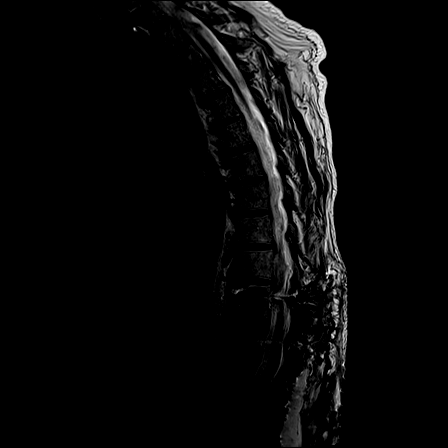
[im 17/17]
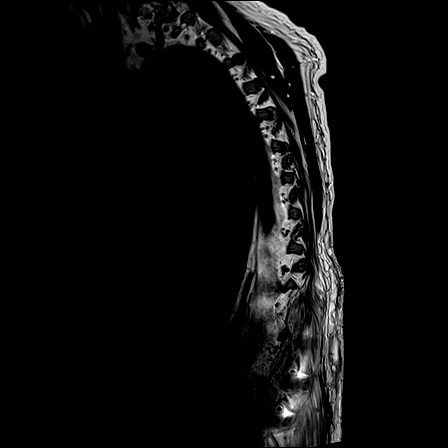

[Series 20: T1 · sagittal · 3.0mm · 0.76mm/px · 4 of 17 slices shown (2 of 3)]
[im 1/17]
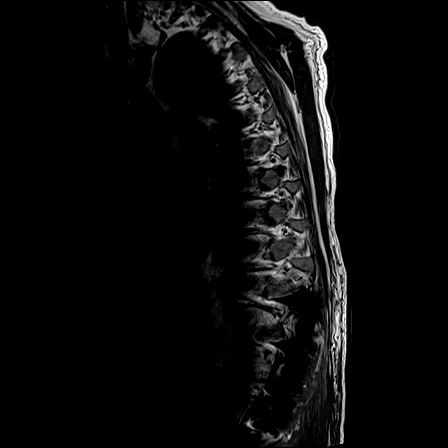
[im 6/17]
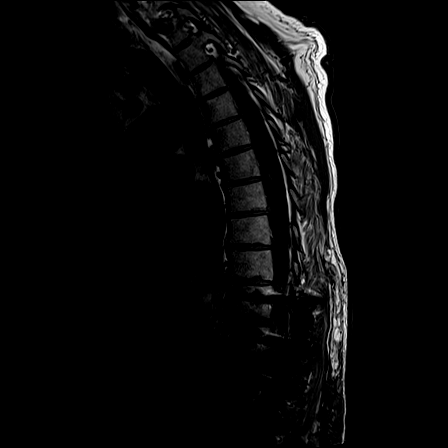
[im 11/17]
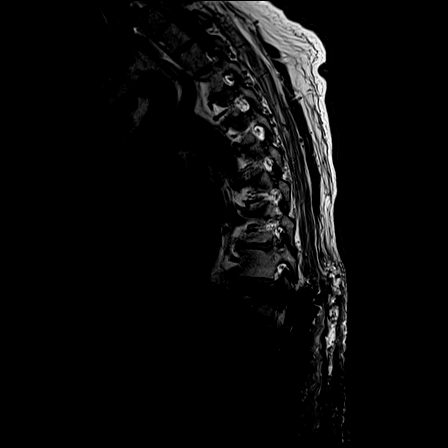
[im 17/17]
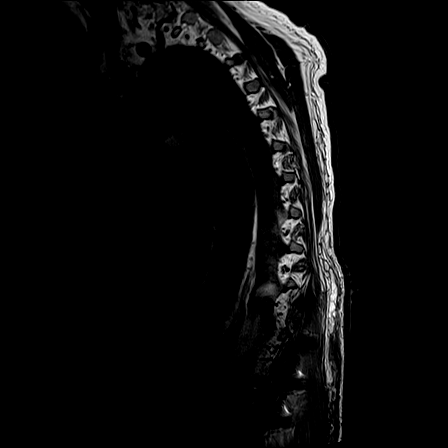

[Series 22: T2 · axial · 5.0mm · 0.59mm/px · z∈[-213,+153]mm · 8 of 39 slices shown (2 of 2)]
[im 1/39]
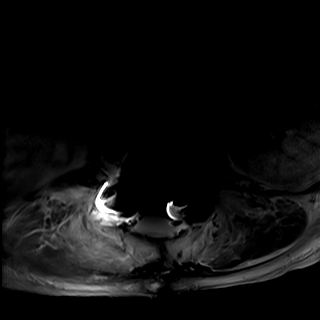
[im 6/39]
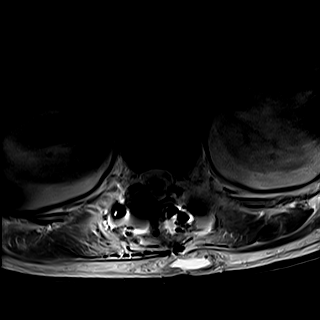
[im 11/39]
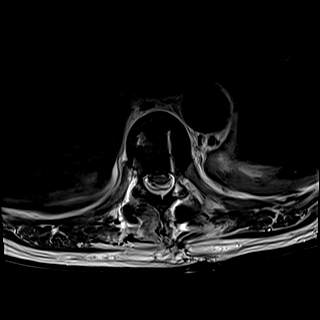
[im 17/39]
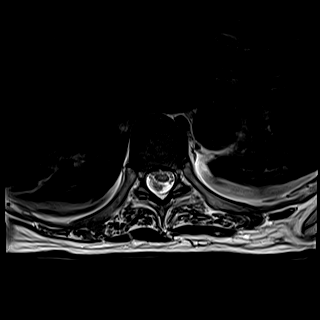
[im 22/39]
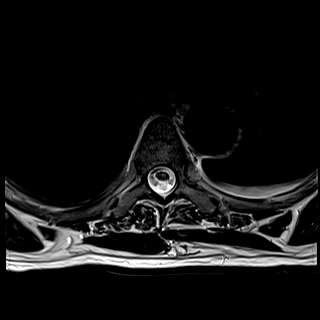
[im 28/39]
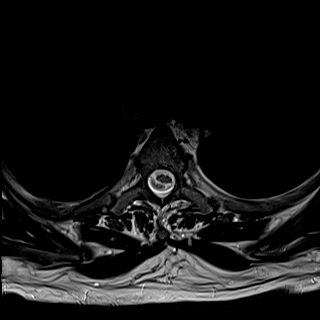
[im 33/39]
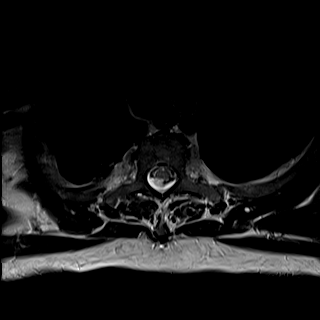
[im 39/39]
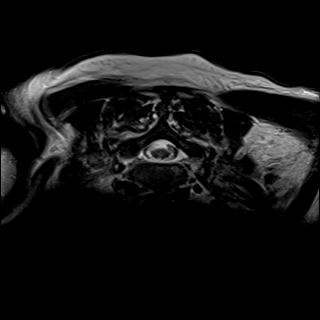

[Series 24: T1 · axial · non-contrast · 5.0mm · 0.31mm/px · z∈[-213,-133]mm · 3 of 39 slices shown (3 of 3)]
[im 1/39]
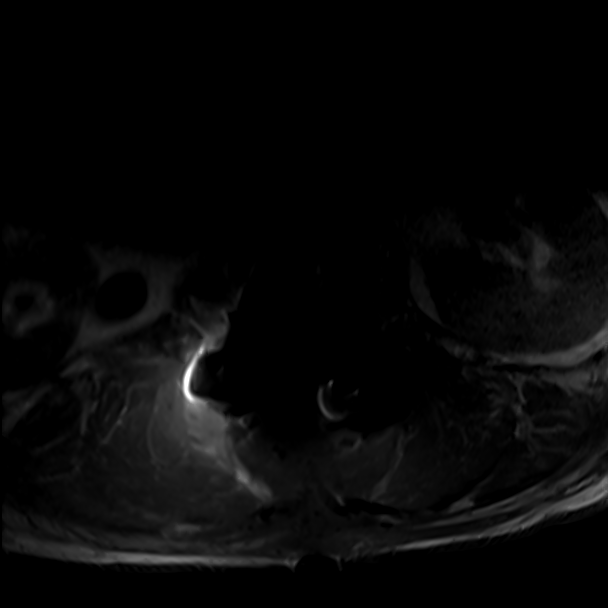
[im 6/39]
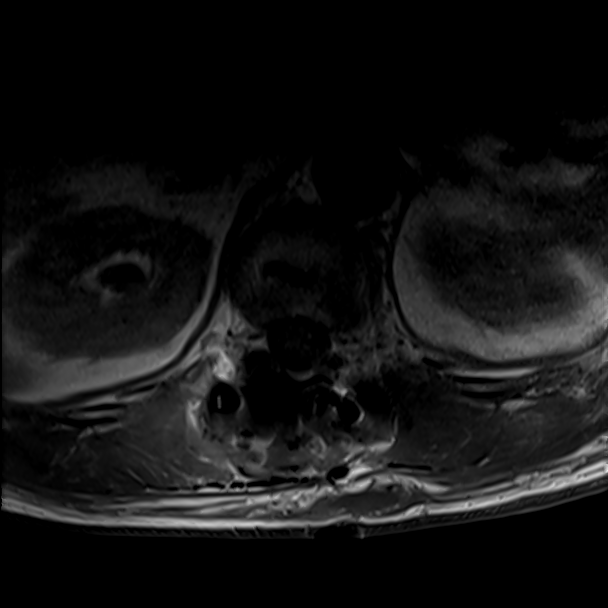
[im 11/39]
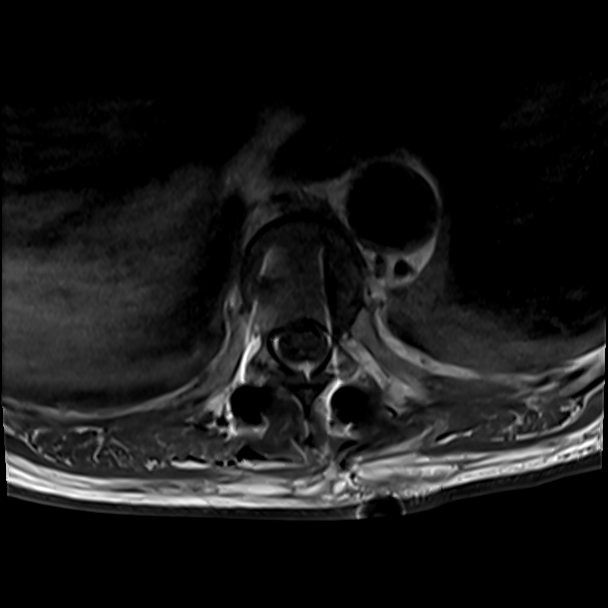

[19 of 48 positions shown; findings below may reference images not displayed]

FINDINGS: MRI THORACIC SPINE FINDINGS

Alignment: Stable alignment with exaggeration of the normal thoracic
kyphosis. No interval listhesis.

Vertebrae: Postsurgical changes from prior posterior fusion at T10
through the sacrum. Sequelae of prior vertebral augmentation at T10,
T11, and T12. Stable height loss about the T12 compression fracture,
involving both the superior and inferior endplates. Additional
fracture at T10 without significant height loss. Vertebral body
height otherwise maintained with no other acute fracture. Underlying
bone marrow signal intensity within normal limits. No worrisome
osseous lesions. No evidence for osteomyelitis discitis or septic
arthritis within the lumbar spine.

Cord: Normal signal and morphology. No epidural abscess or other
collection.

Paraspinal and other soft tissues: Postsurgical changes present
throughout the posterior thoracolumbar soft tissues. Postoperative
collection along the laminectomy site extending from T11 into the
upper lumbar spine, partially visualized. Additional smaller
collection within the subcutaneous fat along the surgical incision
measures 2.3 x 0.6 x 4.9 cm (series 26, image 35). This is favored
to reflect a benign postoperative collection. Small layering
bilateral pleural effusions.

Disc levels:

T7-8 small central to right paracentral disc protrusion with annular
fissure. Mild facet hypertrophy. No significant canal or foraminal
stenosis.

T8-9: Small right paracentral disc protrusion with annular fissure.
Posterior element hypertrophy. No significant spinal stenosis.
Foramina remain patent.

T9-10: Negative interspace. Bilateral facet hypertrophy. No
significant stenosis.

Otherwise, no other significant disc pathology seen within the
thoracic spine. No significant stenosis or neural impingement.

MRI LUMBAR SPINE FINDINGS

Segmentation: Standard. Lowest well-formed disc space labeled the
L5-S1 level.

Alignment: 5 mm anterolisthesis of L4 on L5, stable. Trace
anterolisthesis of L2 on L3, also unchanged. Underlying left convex
scoliosis again noted.

Vertebrae: Extensive susceptibility artifact related to posterior
fusion extending from T10 through the sacrum. Additional interbody
fusion at L3-4 through L5-S1. Compression deformity involving the L2
vertebral body is grossly stable from recent CT. Additional subtle
inferior endplate fracture at L1 is also stable in retrospect.
Chronic compression deformity with subsidence at the superior
endplate of L5, stable. No other new or interval acute compression
fracture. Underlying bone marrow signal intensity within normal
limits. No discrete or worrisome osseous lesions. No evidence for
osteomyelitis discitis or septic arthritis, although evaluation
somewhat limited by extensive susceptibility artifact.

Conus medullaris: Extends to the L1 level and appears normal.
Probable clumping of the nerve roots of the distal cauda equina at
the level of L5, again suggesting arachnoiditis, similar to
previous. No visible epidural abscess or other collection.

Paraspinal and other soft tissues: Extensive postoperative changes
present throughout the posterior paraspinous soft tissues.
Collection along the postoperative site measures approximately 5.7 x
2.4 x 10.2 cm (series 4, image 17). Finding favored to reflect a
benign postoperative collection. Additional smaller collection along
the surgical incision at the overlying subcutaneous fat noted,
described on corresponding thoracic spine portion of this exam. No
other loculated collections. Visualized visceral structures within
normal limits.

Disc levels:

L1-2: Mild disc bulge. Moderate bilateral facet hypertrophy. No more
than mild spinal stenosis at this level. Foramina appear grossly
patent.

L2-3: Trace anterolisthesis. Minimal disc bulge. Prior posterior
decompression with fusion. No significant residual spinal stenosis.
Foramina appear patent.

L3-4: Prior posterior and interbody fusion with posterior
decompression. Left-sided facet hypertrophy. Residual mild narrowing
of the left lateral recess, stable. Central canal remains patent.
Severe right L3 foraminal stenosis, stable. Right neural foramen
remains patent.

L4-5: Anterolisthesis. Prior posterior and interbody fusion with
posterior decompression. No residual spinal stenosis. Residual mild
left greater than right L4 foraminal narrowing.

L5-S1: Prior posterior and interbody fusion with posterior
decompression. Mild residual posterior disc bulge, similar to prior.
No canal or lateral recess stenosis. Foramina remain patent.
IMPRESSION: 1. Postsurgical changes from prior posterior fusion at T10 through
the sacrum. Postoperative collection along the laminectomy site
extending from T11 through L2 measures approximately 5.7 x 2.4 x
10.2 cm, with additional smaller superficial collection along the
surgical incision measuring 2.3 x 0.6 x 4.9 cm. These are favored to
reflect a benign postoperative collections, although correlation
with any potential symptomatology and laboratory values recommended
if there is clinical concern for acute infection. No visible
epidural abscess or other evidence for infection.
2. Compression fractures involving the T10, T12, L1, and L2
vertebral bodies, relatively stable from recent CT. No new or
interval compression fracture.
3. Probable arachnoiditis involving the distal nerve roots of the
cauda equina, similar to prior.
4. No significant residual spinal stenosis within the lumbar spine
status post decompression and fusion. Residual severe left L3
foraminal narrowing, stable.
5. Small layering bilateral pleural effusions.

## 2022-04-05 IMAGING — MR MR LUMBAR SPINE WO/W CM
5 of 7 series · 30 of 48 positions shown · IV contrast (gadavist)
Comparison: Prior CT from [DATE].

CLINICAL DATA: Initial evaluation for suspected epidural abscess.

EXAM:
MRI THORACIC AND LUMBAR SPINE WITHOUT AND WITH CONTRAST
TECHNIQUE: Multiplanar and multiecho pulse sequences of the thoracic and lumbar
spine were obtained without and with intravenous contrast.
CONTRAST:  7.3mL GADAVIST GADOBUTROL 1 MMOL/ML IV SOLN

[Series 1: t2_tse_warp_sag · sagittal · 4.0mm · 0.81mm/px · 3 of 15 slices shown]
[im 1/15]
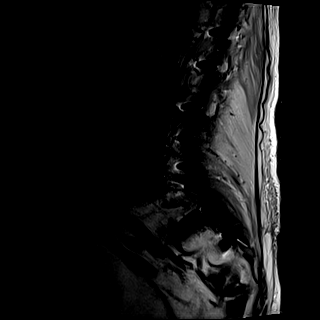
[im 8/15]
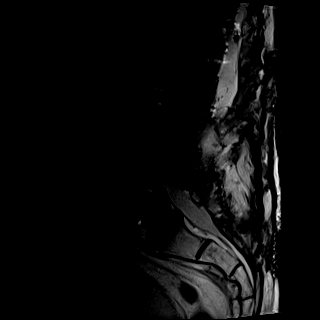
[im 15/15]
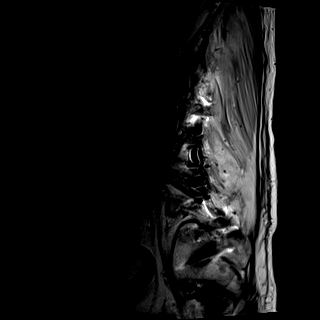

[Series 2: t2_tse_stir_warp_sag · sagittal · 4.0mm · 1.02mm/px · 4 of 15 slices shown]
[im 1/15]
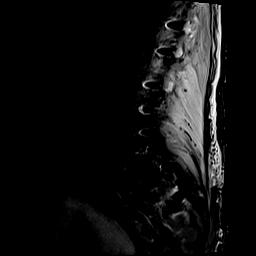
[im 5/15]
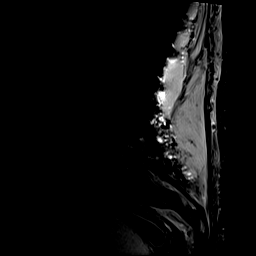
[im 10/15]
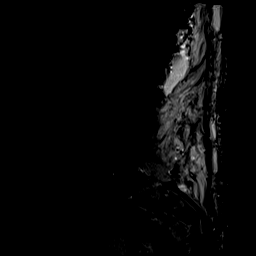
[im 15/15]
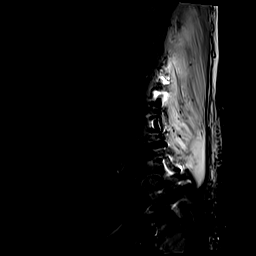

[Series 3: t1_tse_warp_sag · sagittal · 4.0mm · 0.81mm/px · 4 of 15 slices shown]
[im 1/15]
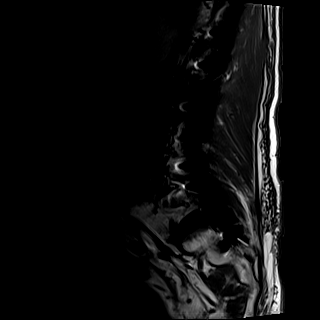
[im 5/15]
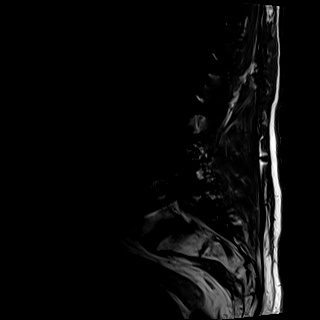
[im 10/15]
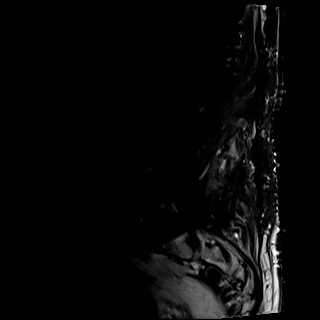
[im 15/15]
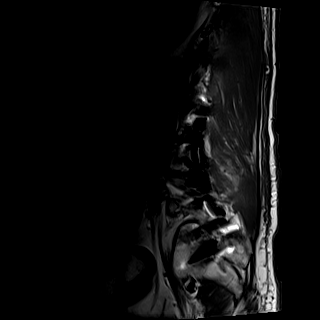

[Series 4: t2_tse_warp_tra · axial · 4.0mm · 0.78mm/px · z∈[-387,-181]mm · 11 of 48 slices shown]
[im 1/48]
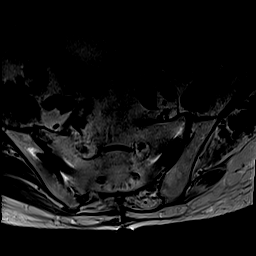
[im 5/48]
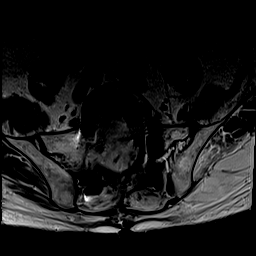
[im 10/48]
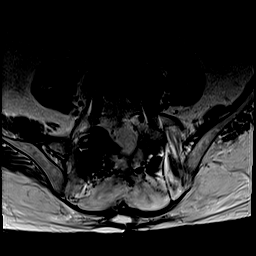
[im 15/48]
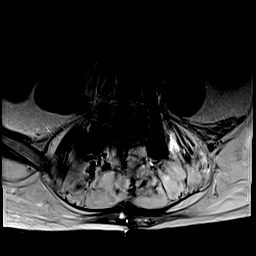
[im 19/48]
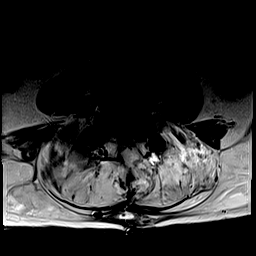
[im 24/48]
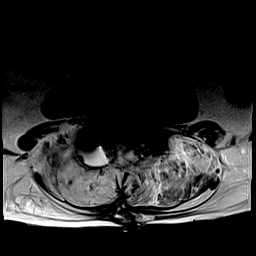
[im 29/48]
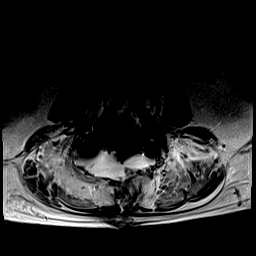
[im 33/48]
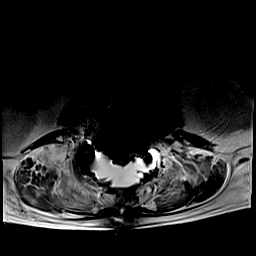
[im 38/48]
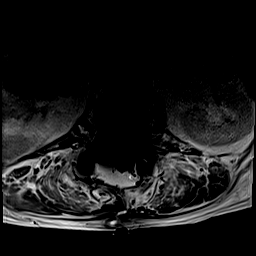
[im 43/48]
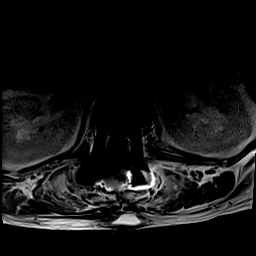
[im 48/48]
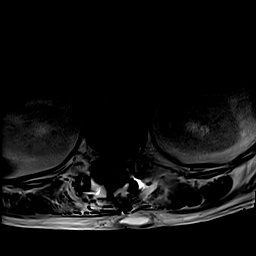

[Series 5: t1_tse_warp_tra · axial · 4.0mm · 0.39mm/px · z∈[-387,-181]mm · 8 of 48 slices shown]
[im 1/48]
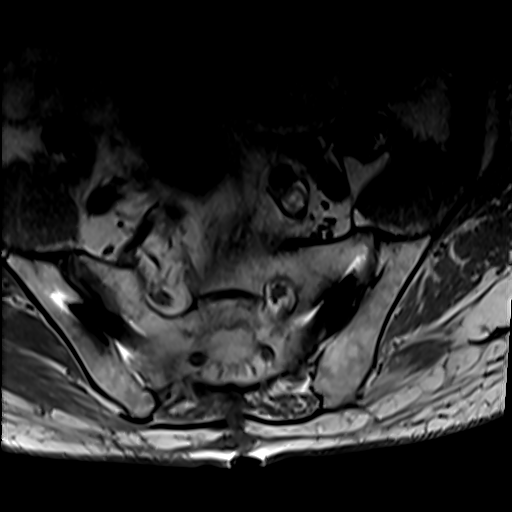
[im 10/48]
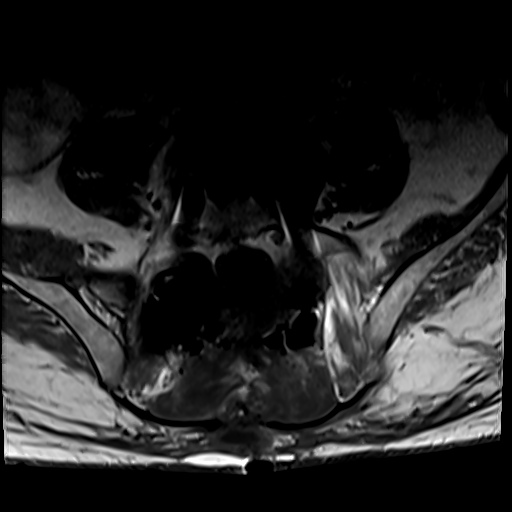
[im 15/48]
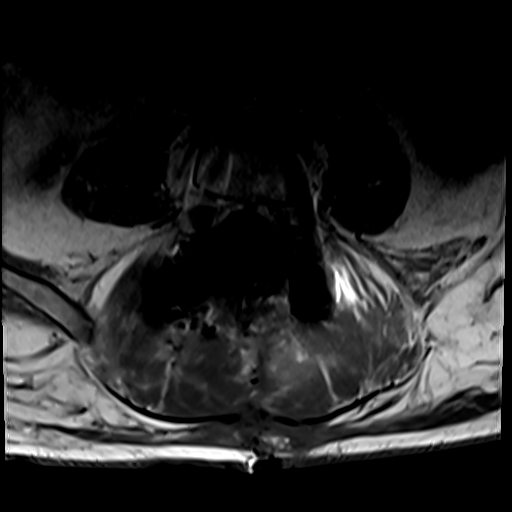
[im 19/48]
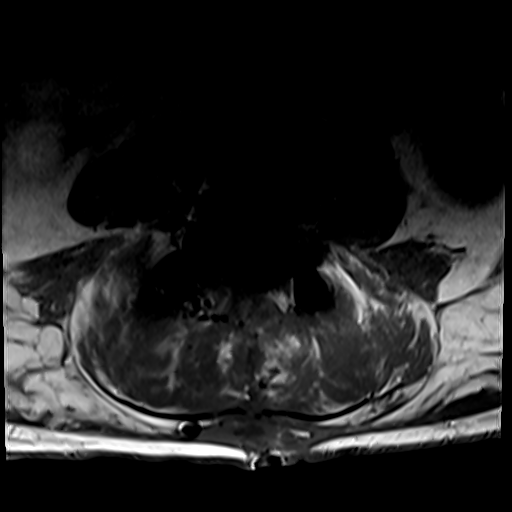
[im 29/48]
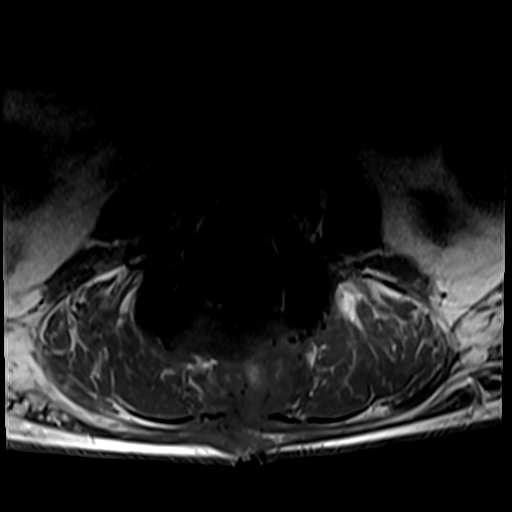
[im 33/48]
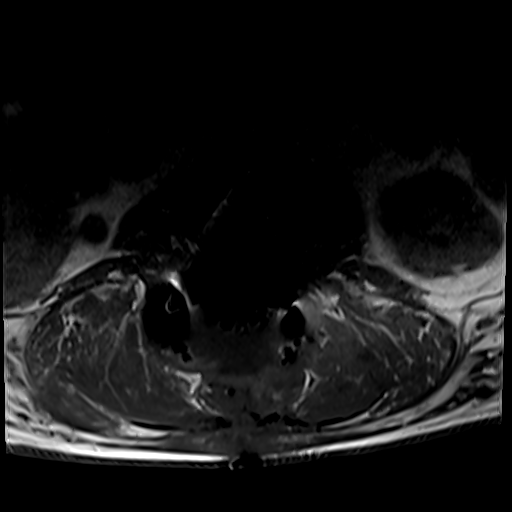
[im 38/48]
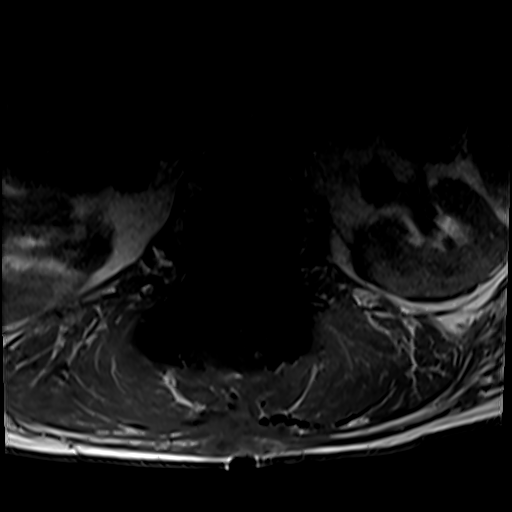
[im 48/48]
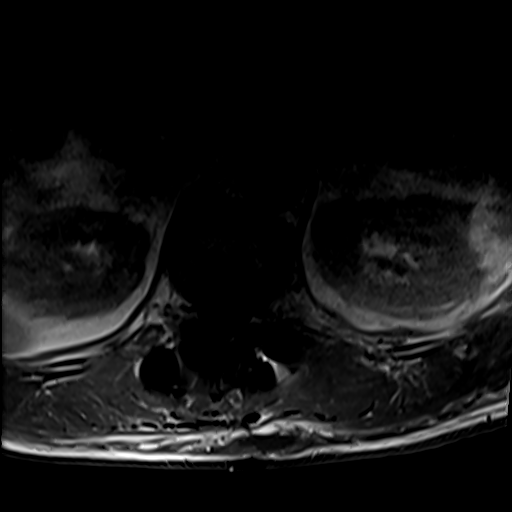

[30 of 48 positions shown; findings below may reference images not displayed]

FINDINGS: MRI THORACIC SPINE FINDINGS

Alignment: Stable alignment with exaggeration of the normal thoracic
kyphosis. No interval listhesis.

Vertebrae: Postsurgical changes from prior posterior fusion at T10
through the sacrum. Sequelae of prior vertebral augmentation at T10,
T11, and T12. Stable height loss about the T12 compression fracture,
involving both the superior and inferior endplates. Additional
fracture at T10 without significant height loss. Vertebral body
height otherwise maintained with no other acute fracture. Underlying
bone marrow signal intensity within normal limits. No worrisome
osseous lesions. No evidence for osteomyelitis discitis or septic
arthritis within the lumbar spine.

Cord: Normal signal and morphology. No epidural abscess or other
collection.

Paraspinal and other soft tissues: Postsurgical changes present
throughout the posterior thoracolumbar soft tissues. Postoperative
collection along the laminectomy site extending from T11 into the
upper lumbar spine, partially visualized. Additional smaller
collection within the subcutaneous fat along the surgical incision
measures 2.3 x 0.6 x 4.9 cm (series 26, image 35). This is favored
to reflect a benign postoperative collection. Small layering
bilateral pleural effusions.

Disc levels:

T7-8 small central to right paracentral disc protrusion with annular
fissure. Mild facet hypertrophy. No significant canal or foraminal
stenosis.

T8-9: Small right paracentral disc protrusion with annular fissure.
Posterior element hypertrophy. No significant spinal stenosis.
Foramina remain patent.

T9-10: Negative interspace. Bilateral facet hypertrophy. No
significant stenosis.

Otherwise, no other significant disc pathology seen within the
thoracic spine. No significant stenosis or neural impingement.

MRI LUMBAR SPINE FINDINGS

Segmentation: Standard. Lowest well-formed disc space labeled the
L5-S1 level.

Alignment: 5 mm anterolisthesis of L4 on L5, stable. Trace
anterolisthesis of L2 on L3, also unchanged. Underlying left convex
scoliosis again noted.

Vertebrae: Extensive susceptibility artifact related to posterior
fusion extending from T10 through the sacrum. Additional interbody
fusion at L3-4 through L5-S1. Compression deformity involving the L2
vertebral body is grossly stable from recent CT. Additional subtle
inferior endplate fracture at L1 is also stable in retrospect.
Chronic compression deformity with subsidence at the superior
endplate of L5, stable. No other new or interval acute compression
fracture. Underlying bone marrow signal intensity within normal
limits. No discrete or worrisome osseous lesions. No evidence for
osteomyelitis discitis or septic arthritis, although evaluation
somewhat limited by extensive susceptibility artifact.

Conus medullaris: Extends to the L1 level and appears normal.
Probable clumping of the nerve roots of the distal cauda equina at
the level of L5, again suggesting arachnoiditis, similar to
previous. No visible epidural abscess or other collection.

Paraspinal and other soft tissues: Extensive postoperative changes
present throughout the posterior paraspinous soft tissues.
Collection along the postoperative site measures approximately 5.7 x
2.4 x 10.2 cm (series 4, image 17). Finding favored to reflect a
benign postoperative collection. Additional smaller collection along
the surgical incision at the overlying subcutaneous fat noted,
described on corresponding thoracic spine portion of this exam. No
other loculated collections. Visualized visceral structures within
normal limits.

Disc levels:

L1-2: Mild disc bulge. Moderate bilateral facet hypertrophy. No more
than mild spinal stenosis at this level. Foramina appear grossly
patent.

L2-3: Trace anterolisthesis. Minimal disc bulge. Prior posterior
decompression with fusion. No significant residual spinal stenosis.
Foramina appear patent.

L3-4: Prior posterior and interbody fusion with posterior
decompression. Left-sided facet hypertrophy. Residual mild narrowing
of the left lateral recess, stable. Central canal remains patent.
Severe right L3 foraminal stenosis, stable. Right neural foramen
remains patent.

L4-5: Anterolisthesis. Prior posterior and interbody fusion with
posterior decompression. No residual spinal stenosis. Residual mild
left greater than right L4 foraminal narrowing.

L5-S1: Prior posterior and interbody fusion with posterior
decompression. Mild residual posterior disc bulge, similar to prior.
No canal or lateral recess stenosis. Foramina remain patent.
IMPRESSION: 1. Postsurgical changes from prior posterior fusion at T10 through
the sacrum. Postoperative collection along the laminectomy site
extending from T11 through L2 measures approximately 5.7 x 2.4 x
10.2 cm, with additional smaller superficial collection along the
surgical incision measuring 2.3 x 0.6 x 4.9 cm. These are favored to
reflect a benign postoperative collections, although correlation
with any potential symptomatology and laboratory values recommended
if there is clinical concern for acute infection. No visible
epidural abscess or other evidence for infection.
2. Compression fractures involving the T10, T12, L1, and L2
vertebral bodies, relatively stable from recent CT. No new or
interval compression fracture.
3. Probable arachnoiditis involving the distal nerve roots of the
cauda equina, similar to prior.
4. No significant residual spinal stenosis within the lumbar spine
status post decompression and fusion. Residual severe left L3
foraminal narrowing, stable.
5. Small layering bilateral pleural effusions.

## 2022-04-05 MED ORDER — MORPHINE SULFATE (PF) 2 MG/ML IV SOLN
2.0000 mg | Freq: Once | INTRAVENOUS | Status: AC
  Administered 2022-04-05: 2 mg via INTRAVENOUS
  Filled 2022-04-05: qty 1

## 2022-04-05 MED ORDER — HYDROMORPHONE HCL 1 MG/ML IJ SOLN
1.0000 mg | Freq: Once | INTRAMUSCULAR | Status: AC
  Administered 2022-04-05: 1 mg via INTRAVENOUS
  Filled 2022-04-05: qty 1

## 2022-04-05 MED ORDER — SODIUM CHLORIDE 0.9 % IV BOLUS
1000.0000 mL | Freq: Once | INTRAVENOUS | Status: AC
  Administered 2022-04-05: 1000 mL via INTRAVENOUS

## 2022-04-05 MED ORDER — GADOBUTROL 1 MMOL/ML IV SOLN
7.3000 mL | Freq: Once | INTRAVENOUS | Status: AC | PRN
  Administered 2022-04-05: 7.3 mL via INTRAVENOUS

## 2022-04-05 NOTE — ED Notes (Signed)
Pt still in mri 

## 2022-04-05 NOTE — ED Provider Notes (Signed)
[facility]ER[facility]ENC[facility] DEPARTMENT Provider Note   CSN: [id number] Arrival date & time: 04/05/22  1452     History  Chief Complaint  Patient presents with   Back Pain    Cody Gonzalez is a 76 y.o. male with past medical history significant for previous traumatic brain injury, hypertension, extensive spinal surgery including spinal fusion in January with epidural abscess formation in February, who just had new spinal surgery on May 26 with incision from T10-S2 who is presenting today with worsening pain, feeling a pop in the lower back on 5/31, and with report of urinary and bowel incontinence this morning.  On arrival he had rated his pain 9/10, on my evaluation patient's pain is 7/10, he reports he took oxycodone once this morning.  Patient denies any groin numbness, tingling, bilateral lower extremity numbness, tingling.  He denies unilateral leg numbness or tingling, weakness compared to the other.  Additionally incidentally PCP noted some congestion in lower lung fields on T-spine CT a few days ago and started patient on prophylactic amoxicillin which she has been taking.  Wife notes that there is some clear to light yellow thin drainage from incision site and so she redressed it this morning.  She denies any thick purulent drainage.  Patient has been afebrile, no nausea, vomiting.  He had negative blood cultures performed prior to his recent surgery. When asked to describe his incontinence this morning patient describes that he did not have a feeling that he was going to lose control of his bowels, but could not make it to the bathroom quickly enough.  There is only 1 isolated incidence of this.   Back Pain     Home Medications Prior to Admission medications   Medication Sig Start Date End Date Taking? Authorizing Provider  acetaminophen (TYLENOL) 500 MG tablet Take 1,000 mg by mouth every 6 (six) hours as needed (for pain- usually between doses of plain  Oxycodone).   Yes [provider]  alfuzosin (UROXATRAL) 10 MG 24 hr tablet Take 10 mg by mouth at bedtime.   Yes [provider]  amoxicillin-clavulanate (AUGMENTIN) 875-125 MG tablet Take 1 tablet by mouth 2 (two) times daily. 04/03/22 04/14/22 Yes [provider]  ascorbic acid (VITAMIN C) 1000 MG tablet Take 1,000 mg by mouth daily.   Yes [provider]  aspirin EC 325 MG tablet Take 325 mg by mouth daily. 04/01/22 05/01/22 Yes [provider]  atorvastatin (LIPITOR) 40 MG tablet Take 40 mg by mouth at bedtime. 09/01/19  Yes [provider]  b complex vitamins capsule Take 1 capsule by mouth daily.   Yes [provider]  CALCIUM PO Take 1,200 mg by mouth daily.   Yes [provider]  Cholecalciferol 50 MCG (2000 UT) CAPS Take 2,000 Units by mouth daily.   Yes [provider]  ferrous sulfate 325 (65 FE) MG tablet Take 325 mg by mouth daily with breakfast.   Yes [provider]  gabapentin (NEURONTIN) 300 MG capsule Take 300 mg by mouth 3 (three) times daily. 04/01/22  Yes [provider]  methocarbamol (ROBAXIN) 750 MG tablet Take 750 mg by mouth 3 (three) times daily. 04/01/22 04/15/22 Yes [provider]  Multiple Vitamins-Iron (MULTIVITAMIN/IRON PO) Take 1 tablet by mouth daily with breakfast.   Yes [provider]  oxyCODONE (OXY IR/ROXICODONE) 5 MG immediate release tablet Take 5 mg by mouth See admin instructions. Take 5 mg by mouth every 4-6 hours 04/01/22  04/08/22 Yes [provider]  telmisartan (MICARDIS) 80 MG tablet Take 80 mg by mouth daily.   Yes [provider]  zinc gluconate 50 MG tablet Take 50 mg by mouth daily.   Yes [provider]  acetaminophen (TYLENOL) 325 MG tablet Take 2 tablets (650 mg total) by mouth every 6 (six) hours as needed for mild pain (or Fever >/= 101). Patient not taking: Reported on 04/05/2022 01/05/22   Nolberto Hanlon, MD   COVID-19 mRNA bivalent vaccine, Pfizer, (PFIZER COVID-19 VAC BIVALENT) injection Inject into the muscle. Patient not taking: Reported on 04/05/2022 03/07/22   Carlyle Basques, MD  Lidocaine 4 % PTCH Apply 2 patches topically daily. Patient not taking: Reported on 04/05/2022    [provider]  lidocaine 4 % Place onto the skin. Patient not taking: Reported on 04/05/2022    [provider]  NIFEdipine (ADALAT CC) 30 MG 24 hr tablet Take 1 tablet (30 mg total) by mouth daily. Patient not taking: Reported on 04/05/2022 03/10/22   Rex Kras, DO      Allergies    Amlodipine besylate    Review of Systems   Review of Systems  Musculoskeletal:  Positive for back pain.  All other systems reviewed and are negative.  Physical Exam Updated Vital Signs BP (!) 165/77   Pulse 61   Temp 98.2 F (36.8 C)   Resp 12   SpO2 94%  Physical Exam Vitals and nursing note reviewed.  Constitutional:      General: He is not in acute distress.    Appearance: Normal appearance.  HENT:     Head: Normocephalic and atraumatic.  Eyes:     General:        Right eye: No discharge.        Left eye: No discharge.  Cardiovascular:     Rate and Rhythm: Normal rate and regular rhythm.     Heart sounds: No murmur heard.   No friction rub. No gallop.  Pulmonary:     Effort: Pulmonary effort is normal.     Breath sounds: Normal breath sounds.  Abdominal:     General: Bowel sounds are normal.     Palpations: Abdomen is soft.  Musculoskeletal:     Comments: Intact strength 5 out of 5 bilateral upper and lower extremities, some hesitancy secondary to pain.  Skin:    General: Skin is warm and dry.     Capillary Refill: Capillary refill takes less than 2 seconds.     Comments: Incision site appears clean, dry, no active bleeding, redness, purulent drainage on my examination.  No significant tenderness to palpation directly on the incision site.  Neurological:     Mental Status: He is alert and  oriented to person, place, and time.     Comments: No sensory deficits noted in bilateral legs, groin.  Normal coordination of leg movement.  Psychiatric:        Mood and Affect: Mood normal.        Behavior: Behavior normal.    ED Results / Procedures / Treatments   Labs (all labs ordered are listed, but only abnormal results are displayed) Labs Reviewed  CBC - Abnormal; Notable for the following components:      Result Value   RBC 3.19 (*)    Hemoglobin 10.1 (*)    HCT 29.4 (*)    All other components within normal limits  BASIC METABOLIC PANEL - Abnormal; Notable for the following components:  Sodium 122 (*)    Chloride 87 (*)    Glucose, Bld 119 (*)    Calcium 8.6 (*)    All other components within normal limits  SEDIMENTATION RATE - Abnormal; Notable for the following components:   Sed Rate 65 (*)    All other components within normal limits  C-REACTIVE PROTEIN - Abnormal; Notable for the following components:   CRP 2.7 (*)    All other components within normal limits  CULTURE, BLOOD (ROUTINE X 2)  CULTURE, BLOOD (ROUTINE X 2)  LACTIC ACID, PLASMA    EKG None  Radiology DG Chest 2 View  Result Date: 04/05/2022 CLINICAL DATA:  Postop from thoracic spine fusion. Follow-up atelectasis or pneumonia. EXAM: CHEST - 2 VIEW COMPARISON:  01/03/2022 FINDINGS: Low lung volumes are seen. Both lungs remain clear. No evidence of pneumothorax or pleural effusion. Posterior thoracolumbar spine fusion rods are seen. IMPRESSION: Low lung volumes. No active disease. Electronically Signed   By: Marlaine Hind M.D.   On: 04/05/2022 16:39   MR THORACIC SPINE W WO CONTRAST  Result Date: 04/05/2022 CLINICAL DATA:  Initial evaluation for suspected epidural abscess. EXAM: MRI THORACIC AND LUMBAR SPINE WITHOUT AND WITH CONTRAST TECHNIQUE: Multiplanar and multiecho pulse sequences of the thoracic and lumbar spine were obtained without and with intravenous contrast. CONTRAST:  7.47mL GADAVIST  GADOBUTROL 1 MMOL/ML IV SOLN COMPARISON:  Prior CT from 04/02/2022. FINDINGS: MRI THORACIC SPINE FINDINGS Alignment: Stable alignment with exaggeration of the normal thoracic kyphosis. No interval listhesis. Vertebrae: Postsurgical changes from prior posterior fusion at T10 through the sacrum. Sequelae of prior vertebral augmentation at T10, T11, and T12. Stable height loss about the T12 compression fracture, involving both the superior and inferior endplates. Additional fracture at T10 without significant height loss. Vertebral body height otherwise maintained with no other acute fracture. Underlying bone marrow signal intensity within normal limits. No worrisome osseous lesions. No evidence for osteomyelitis discitis or septic arthritis within the lumbar spine. Cord: Normal signal and morphology. No epidural abscess or other collection. Paraspinal and other soft tissues: Postsurgical changes present throughout the posterior thoracolumbar soft tissues. Postoperative collection along the laminectomy site extending from T11 into the upper lumbar spine, partially visualized. Additional smaller collection within the subcutaneous fat along the surgical incision measures 2.3 x 0.6 x 4.9 cm (series 26, image 35). This is favored to reflect a benign postoperative collection. Small layering bilateral pleural effusions. Disc levels: T7-8 small central to right paracentral disc protrusion with annular fissure. Mild facet hypertrophy. No significant canal or foraminal stenosis. T8-9: Small right paracentral disc protrusion with annular fissure. Posterior element hypertrophy. No significant spinal stenosis. Foramina remain patent. T9-10: Negative interspace. Bilateral facet hypertrophy. No significant stenosis. Otherwise, no other significant disc pathology seen within the thoracic spine. No significant stenosis or neural impingement. MRI LUMBAR SPINE FINDINGS Segmentation: Standard. Lowest well-formed disc space labeled the  L5-S1 level. Alignment: 5 mm anterolisthesis of L4 on L5, stable. Trace anterolisthesis of L2 on L3, also unchanged. Underlying left convex scoliosis again noted. Vertebrae: Extensive susceptibility artifact related to posterior fusion extending from T10 through the sacrum. Additional interbody fusion at L3-4 through L5-S1. Compression deformity involving the L2 vertebral body is grossly stable from recent CT. Additional subtle inferior endplate fracture at L1 is also stable in retrospect. Chronic compression deformity with subsidence at the superior endplate of L5, stable. No other new or interval acute compression fracture. Underlying bone marrow signal intensity within normal limits. No discrete or worrisome osseous  lesions. No evidence for osteomyelitis discitis or septic arthritis, although evaluation somewhat limited by extensive susceptibility artifact. Conus medullaris: Extends to the L1 level and appears normal. Probable clumping of the nerve roots of the distal cauda equina at the level of L5, again suggesting arachnoiditis, similar to previous. No visible epidural abscess or other collection. Paraspinal and other soft tissues: Extensive postoperative changes present throughout the posterior paraspinous soft tissues. Collection along the postoperative site measures approximately 5.7 x 2.4 x 10.2 cm (series 4, image 17). Finding favored to reflect a benign postoperative collection. Additional smaller collection along the surgical incision at the overlying subcutaneous fat noted, described on corresponding thoracic spine portion of this exam. No other loculated collections. Visualized visceral structures within normal limits. Disc levels: L1-2: Mild disc bulge. Moderate bilateral facet hypertrophy. No more than mild spinal stenosis at this level. Foramina appear grossly patent. L2-3: Trace anterolisthesis. Minimal disc bulge. Prior posterior decompression with fusion. No significant residual spinal stenosis.  Foramina appear patent. L3-4: Prior posterior and interbody fusion with posterior decompression. Left-sided facet hypertrophy. Residual mild narrowing of the left lateral recess, stable. Central canal remains patent. Severe right L3 foraminal stenosis, stable. Right neural foramen remains patent. L4-5: Anterolisthesis. Prior posterior and interbody fusion with posterior decompression. No residual spinal stenosis. Residual mild left greater than right L4 foraminal narrowing. L5-S1: Prior posterior and interbody fusion with posterior decompression. Mild residual posterior disc bulge, similar to prior. No canal or lateral recess stenosis. Foramina remain patent. IMPRESSION: 1. Postsurgical changes from prior posterior fusion at T10 through the sacrum. Postoperative collection along the laminectomy site extending from T11 through L2 measures approximately 5.7 x 2.4 x 10.2 cm, with additional smaller superficial collection along the surgical incision measuring 2.3 x 0.6 x 4.9 cm. These are favored to reflect a benign postoperative collections, although correlation with any potential symptomatology and laboratory values recommended if there is clinical concern for acute infection. No visible epidural abscess or other evidence for infection. 2. Compression fractures involving the T10, T12, L1, and L2 vertebral bodies, relatively stable from recent CT. No new or interval compression fracture. 3. 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 4. No significant residual spinal stenosis within the lumbar spine status post decompression and fusion. Residual severe left L3 foraminal narrowing, stable. 5. Small layering bilateral pleural effusions. Electronically Signed   By: Jeannine Boga M.D.   On: 04/05/2022 22:01   MR Lumbar Spine W Wo Contrast  Result Date: 04/05/2022 CLINICAL DATA:  Initial evaluation for suspected epidural abscess. EXAM: MRI THORACIC AND LUMBAR  SPINE WITHOUT AND WITH CONTRAST TECHNIQUE: Multiplanar and multiecho pulse sequences of the thoracic and lumbar spine were obtained without and with intravenous contrast. CONTRAST:  7.53m GADAVIST GADOBUTROL 1 MMOL/ML IV SOLN COMPARISON:  Prior CT from 04/02/2022. FINDINGS: MRI THORACIC SPINE FINDINGS Alignment: Stable alignment with exaggeration of the normal thoracic kyphosis. No interval listhesis. Vertebrae: Postsurgical changes from prior posterior fusion at T10 through the sacrum. Sequelae of prior vertebral augmentation at T10, T11, and T12. Stable height loss about the T12 compression fracture, involving both the superior and inferior endplates. Additional fracture at T10 without significant height loss. Vertebral body height otherwise maintained with no other acute fracture. Underlying bone marrow signal intensity within normal limits. No worrisome osseous lesions. No evidence for osteomyelitis discitis or septic arthritis within the lumbar spine. Cord: Normal signal and morphology. No epidural abscess or other collection. Paraspinal and other soft tissues: Postsurgical  changes present throughout the posterior thoracolumbar soft tissues. Postoperative collection along the laminectomy site extending from T11 into the upper lumbar spine, partially visualized. Additional smaller collection within the subcutaneous fat along the surgical incision measures 2.3 x 0.6 x 4.9 cm (series 26, image 35). This is favored to reflect a benign postoperative collection. Small layering bilateral pleural effusions. Disc levels: T7-8 small central to right paracentral disc protrusion with annular fissure. Mild facet hypertrophy. No significant canal or foraminal stenosis. T8-9: Small right paracentral disc protrusion with annular fissure. Posterior element hypertrophy. No significant spinal stenosis. Foramina remain patent. T9-10: Negative interspace. Bilateral facet hypertrophy. No significant stenosis. Otherwise, no other  significant disc pathology seen within the thoracic spine. No significant stenosis or neural impingement. MRI LUMBAR SPINE FINDINGS Segmentation: Standard. Lowest well-formed disc space labeled the L5-S1 level. Alignment: 5 mm anterolisthesis of L4 on L5, stable. Trace anterolisthesis of L2 on L3, also unchanged. Underlying left convex scoliosis again noted. Vertebrae: Extensive susceptibility artifact related to posterior fusion extending from T10 through the sacrum. Additional interbody fusion at L3-4 through L5-S1. Compression deformity involving the L2 vertebral body is grossly stable from recent CT. Additional subtle inferior endplate fracture at L1 is also stable in retrospect. Chronic compression deformity with subsidence at the superior endplate of L5, stable. No other new or interval acute compression fracture. Underlying bone marrow signal intensity within normal limits. No discrete or worrisome osseous lesions. No evidence for osteomyelitis discitis or septic arthritis, although evaluation somewhat limited by extensive susceptibility artifact. Conus medullaris: Extends to the L1 level and appears normal. Probable clumping of the nerve roots of the distal cauda equina at the level of L5, again suggesting arachnoiditis, similar to previous. No visible epidural abscess or other collection. Paraspinal and other soft tissues: Extensive postoperative changes present throughout the posterior paraspinous soft tissues. Collection along the postoperative site measures approximately 5.7 x 2.4 x 10.2 cm (series 4, image 17). Finding favored to reflect a benign postoperative collection. Additional smaller collection along the surgical incision at the overlying subcutaneous fat noted, described on corresponding thoracic spine portion of this exam. No other loculated collections. Visualized visceral structures within normal limits. Disc levels: L1-2: Mild disc bulge. Moderate bilateral facet hypertrophy. No more than  mild spinal stenosis at this level. Foramina appear grossly patent. L2-3: Trace anterolisthesis. Minimal disc bulge. Prior posterior decompression with fusion. No significant residual spinal stenosis. Foramina appear patent. L3-4: Prior posterior and interbody fusion with posterior decompression. Left-sided facet hypertrophy. Residual mild narrowing of the left lateral recess, stable. Central canal remains patent. Severe right L3 foraminal stenosis, stable. Right neural foramen remains patent. L4-5: Anterolisthesis. Prior posterior and interbody fusion with posterior decompression. No residual spinal stenosis. Residual mild left greater than right L4 foraminal narrowing. L5-S1: Prior posterior and interbody fusion with posterior decompression. Mild residual posterior disc bulge, similar to prior. No canal or lateral recess stenosis. Foramina remain patent. IMPRESSION: 1. Postsurgical changes from prior posterior fusion at T10 through the sacrum. Postoperative collection along the laminectomy site extending from T11 through L2 measures approximately 5.7 x 2.4 x 10.2 cm, with additional smaller superficial collection along the surgical incision measuring 2.3 x 0.6 x 4.9 cm. These are favored to reflect a benign postoperative collections, although correlation with any potential symptomatology and laboratory values recommended if there is clinical concern for acute infection. No visible epidural abscess or other evidence for infection. 2. Compression fractures involving the T10, T12, L1, and L2 vertebral bodies, relatively stable from recent  CT. No new or interval compression fracture. 3. 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 4. No significant residual spinal stenosis within the lumbar spine status post decompression and fusion. Residual severe left L3 foraminal narrowing, stable. 5. Small layering bilateral pleural effusions. Electronically Signed   By: Jeannine Boga M.D.   On: 04/05/2022 22:01    Procedures Procedures    Medications Ordered in ED Medications  morphine (PF) 2 MG/ML injection 2 mg (2 mg Intravenous Given 04/05/22 1746)  sodium chloride 0.9 % bolus 1,000 mL (0 mLs Intravenous Stopped 04/05/22 2238)  gadobutrol (GADAVIST) 1 MMOL/ML injection 7.3 mL (7.3 mLs Intravenous Contrast Given 04/05/22 1958)  morphine (PF) 2 MG/ML injection 2 mg (2 mg Intravenous Given 04/05/22 2148)  HYDROmorphone (DILAUDID) injection 1 mg (1 mg Intravenous Given 04/05/22 2309)    ED Course/ Medical Decision Making/ A&P Clinical Course as of 04/06/22 0025  Sat Apr 05, 2022  1820 Patient with some significant hyponatremia, sodium 122.  We will start him on a fluid bolus to help correct his hyponatremia.  CRP elevated to 2.7.  No leukocytosis noted. [CP]  2147 Pending MRI results  [CP]    Clinical Course User Index [CP] Anselmo Pickler, PA-C                           Medical Decision Making Amount and/or Complexity of Data Reviewed Labs: ordered. Radiology: ordered.  Risk Prescription drug management.   This patient is a 76 y.o. male who presents to the ED for concern of worsening back pain, urinary and fecal incontinence, this involves an extensive number of treatment options, and is a complaint that carries with it a high risk of complications and morbidity. The emergent differential diagnosis prior to evaluation includes, but is not limited to,  epidural abscess, osteomyelitis, other surgical site infection, postop pain and swelling, cauda equina syndrome versus other.  This is not an exhaustive differential.   Past Medical History / Co-morbidities / Social History: Previous history of spinal surgery, performed at [facility], previous history of epidural abscess  Additional history: Chart reviewed. Pertinent results include: Reviewed surgical notes from patient's [facility] surgeon Dr. Louanne Skye.  Reviewed lab work and imaging from recent urgent care  evaluation for postoperative pain including CT which showed postsurgical changes, no evidence of clear new compression fracture, or other abnormality.  Physical Exam: Physical exam performed. The pertinent findings include: Patient endorses 2 episodes of incontinence, with both urinary and fecal incontinence earlier, and urinary incontinence while in MRI.  He has no groin anesthesia, no strength deficits, no sensory deficits on my exam.  Lab Tests: I ordered, and personally interpreted labs.  The pertinent results include: Patient with a mixed presentation of his lab work.  He has a slight worsening of his chronic hyponatremia, sodium is 122 today.  We will replete with fluid bolus.  Blood cultures are pending at this time.  Lactic acid is negative.  CBC is reassuring with no clinically significant leukocytosis.  He has a mild anemia with hemoglobin of 10.1.  He does have some elevation of his CRP and ESR.  CRP 2.7, sed rate is 65.  Difficult to assess whether this is secondary to possible developing early epidural abscess/osteomyelitis or postsurgical inflammation.   Imaging Studies: I ordered imaging studies including plain film radiograph of the chest. I independently visualized and interpreted imaging which showed no evidence of pneumonia.  I agree with the radiologist interpretation. I independently interpreted imaging including thoracic MRI and lumbar MRI which shows postsurgical fluid collection from T11-L2, favored to likely be postsurgical inflammation and swelling, but cannot acutely rule out any early infection, no clear signs of epidural abscess or osteomyelitis.  There is signs of central cord compression, other chronic changes as noted in body radiology report, as well as significant spinal disease, and prior surgical changes. I agree with the radiologist interpretation.   Medications: I ordered medication including morphine, Dilaudid for pain. Reevaluation of the patient after these  medicines showed that the patient improved. I have reviewed the patients home medicines and have made adjustments as needed.  Consultations Obtained: I requested consultation with the on-call provider for Dr. Louanne Skye at [facility],  and discussed lab and imaging findings as well as pertinent plan - they recommend: Consultation pending at this time.  Care coordination line has been contacted.   12:25 AM Care of Cody Gonzalez transferred to Dr. Christy Gentles at the end of my shift as the patient will require reassessment once labs/imaging have resulted. Patient presentation, ED course, and plan of care discussed with review of all pertinent labs and imaging. Please see his/her note for further details regarding further ED course and disposition. Plan at time of handoff is pending [facility] consult, likely transfer to [facility]. This may be altered or completely changed at the discretion of the oncoming team pending results of further workup.   Final Clinical Impression(s) / ED Diagnoses Final diagnoses:  None    Rx / DC Orders ED Discharge Orders     None         Dorien Chihuahua 04/06/22 Shellee Milo, MD 04/06/22 [phone]

## 2022-04-05 NOTE — ED Triage Notes (Signed)
Pt had spinal fusion at [facility] and discharged on Tuesday.  Wednesday he felt a pop in lower back.  Wife states he has had an x-ray and CT since then within [facility] system and [facility] has looked at imaging as well.  Reports urinary and bowel incontinence this morning.  Pain 9/10.  Took oxycodone at 11:15am.

## 2022-04-05 NOTE — ED Notes (Signed)
To mri 

## 2022-04-05 NOTE — ED Notes (Signed)
To x-ray

## 2022-04-05 NOTE — ED Provider Notes (Incomplete)
[place] EMERGENCY DEPARTMENT Provider Note   CSN: [id number] Arrival date & time: 04/05/22  1452     History {Add pertinent medical, surgical, social history, OB history to HPI:1} Chief Complaint  Patient presents with  . Back Pain    Cody Gonzalez is a 76 y.o. male with past medical history significant for previous traumatic brain injury, hypertension, extensive spinal surgery including spinal fusion in January with epidural abscess formation in February, who just had new spinal surgery on May 26 with incision from T10-S2 who is presenting today with worsening pain, feeling a pop in the lower back on 5/31, and with report of urinary and bowel incontinence this morning.  On arrival he had rated his pain 9/10, on my evaluation patient's pain is 7/10, he reports he took oxycodone once this morning.  Patient denies any groin numbness, tingling, bilateral lower extremity numbness, tingling.  He denies unilateral leg numbness or tingling, weakness compared to the other.  Additionally incidentally PCP noted some congestion in lower lung fields on T-spine CT a few days ago and started patient on prophylactic amoxicillin which she has been taking.  Wife notes that there is some clear to light yellow thin drainage from incision site and so she redressed it this morning.  She denies any thick purulent drainage.  Patient has been afebrile, no nausea, vomiting.  He had negative blood cultures performed prior to his recent surgery. When asked to describe his incontinence this morning patient describes that he did not have a feeling that he was going to lose control of his bowels, but could not make it to the bathroom quickly enough.  There is only 1 isolated incidence of this.   Back Pain     Home Medications Prior to Admission medications   Medication Sig Start Date End Date Taking? Authorizing Provider  acetaminophen (TYLENOL) 325 MG tablet Take 2 tablets (650 mg total) by  mouth every 6 (six) hours as needed for mild pain (or Fever >/= 101). 01/05/22   Nolberto Hanlon, MD  alfuzosin (UROXATRAL) 10 MG 24 hr tablet Take 1 tablet by mouth daily at 6 (six) AM.    [provider]  ascorbic acid (VITAMIN C) 1000 MG tablet Take by mouth.    [provider]  aspirin EC 325 MG tablet Take by mouth. 04/01/22 05/01/22  [provider]  atorvastatin (LIPITOR) 40 MG tablet Take 40 mg by mouth at bedtime. 09/01/19   [provider]  b complex vitamins capsule Take 1 capsule by mouth daily.    [provider]  CALCIUM PO Take 1,200 tablets by mouth daily.    [provider]  Cholecalciferol 50 MCG (2000 UT) CAPS Take by mouth.    [provider]  COVID-19 mRNA bivalent vaccine, Pfizer, (PFIZER COVID-19 VAC BIVALENT) injection Inject into the muscle. 03/07/22   Carlyle Basques, MD  doxycycline (VIBRAMYCIN) 100 MG capsule Take 100 mg by mouth 2 (two) times daily.    [provider]  ferrous sulfate 325 (65 FE) MG tablet Take 325 mg by mouth at bedtime.    [provider]  furosemide (LASIX) 20 MG tablet Take 20 mg by mouth daily. 03/24/22   [provider]  gabapentin (NEURONTIN) 300 MG capsule Take 300 mg by mouth 3 (three) times daily. 04/01/22   [provider]  HYDROcodone-acetaminophen (NORCO/VICODIN) 5-325 MG tablet Take by mouth. 02/14/22   [provider]  Lidocaine 4 % PTCH Apply 2 patches  topically daily.    [provider]  lidocaine 4 % Place onto the skin.    [provider]  methocarbamol (ROBAXIN) 750 MG tablet Take by mouth. 04/01/22 04/15/22  [provider]  Multiple Vitamin (MULTI-VITAMIN) tablet Take 1 tablet by mouth daily.    [provider]  Multiple Vitamins-Iron (MULTIVITAMIN/IRON PO) Take 1 tablet by mouth daily with breakfast.    [provider]  NIFEdipine (ADALAT CC) 30 MG 24 hr tablet Take 1 tablet (30 mg total) by  mouth daily. 03/10/22   Tolia, Sunit, DO  oxyCODONE (OXY IR/ROXICODONE) 5 MG immediate release tablet Take by mouth. 04/01/22 04/08/22  [provider]  Potassium Chloride ER 20 MEQ TBCR Take 1 tablet by mouth daily. 04/01/22   [provider]  telmisartan (MICARDIS) 80 MG tablet Take 80 mg by mouth daily.    [provider]  vitamin C (ASCORBIC ACID) 500 MG tablet Take 500 mg by mouth daily.    [provider]  zinc gluconate 50 MG tablet Take 50 mg by mouth daily. X 14 days, will end on 01-06-22    [provider]      Allergies    Amlodipine besylate    Review of Systems   Review of Systems  Musculoskeletal:  Positive for back pain.  All other systems reviewed and are negative.  Physical Exam Updated Vital Signs BP (!) 161/69 (BP Location: Left Arm)   Pulse 64   Temp 98 F (36.7 C) (Oral)   Resp 15   SpO2 97%  Physical Exam Vitals and nursing note reviewed.  Constitutional:      General: He is not in acute distress.    Appearance: Normal appearance.  HENT:     Head: Normocephalic and atraumatic.  Eyes:     General:        Right eye: No discharge.        Left eye: No discharge.  Cardiovascular:     Rate and Rhythm: Normal rate and regular rhythm.     Heart sounds: No murmur heard.   No friction rub. No gallop.  Pulmonary:     Effort: Pulmonary effort is normal.     Breath sounds: Normal breath sounds.  Abdominal:     General: Bowel sounds are normal.     Palpations: Abdomen is soft.  Musculoskeletal:     Comments: Intact strength 5 out of 5 bilateral upper and lower extremities, some hesitancy secondary to pain.  Skin:    General: Skin is warm and dry.     Capillary Refill: Capillary refill takes less than 2 seconds.     Comments: Incision site appears clean, dry, no active bleeding, redness, purulent drainage on my examination.  No significant tenderness to palpation directly on the incision site.  Neurological:     Mental  Status: He is alert and oriented to person, place, and time.     Comments: No sensory deficits noted in bilateral legs, groin.  Normal coordination of leg movement.  Psychiatric:        Mood and Affect: Mood normal.        Behavior: Behavior normal.    ED Results / Procedures / Treatments   Labs (all labs ordered are listed, but only abnormal results are displayed) Labs Reviewed - No data to display  EKG None  Radiology No results found.  Procedures Procedures  {Document cardiac monitor, telemetry assessment procedure when appropriate:1}  Medications Ordered in ED Medications -  No data to display  ED Course/ Medical Decision Making/ A&P Clinical Course as of 04/05/22 2338  Sat Apr 05, 2022  1820 Patient with some significant hyponatremia, sodium 122.  We will start him on a fluid bolus to help correct his hyponatremia.  CRP elevated to 2.7.  No leukocytosis noted. [CP]  2147 Pending MRI results  [CP]    Clinical Course User Index [CP] Anselmo Pickler, PA-C                           Medical Decision Making Amount and/or Complexity of Data Reviewed Labs: ordered. Radiology: ordered.  Risk Prescription drug management.   This patient is a 76 y.o. male who presents to the ED for concern of worsening back pain, urinary and fecal incontinence, this involves an extensive number of treatment options, and is a complaint that carries with it a high risk of complications and morbidity. The emergent differential diagnosis prior to evaluation includes, but is not limited to,  epidural abscess, osteomyelitis, other surgical site infection, postop pain and swelling, cauda equina syndrome versus other.  This is not an exhaustive differential.   Past Medical History / Co-morbidities / Social History: Previous history of spinal surgery, performed at [facility], previous history of epidural abscess  Additional history: Chart reviewed. Pertinent results include: ***  Physical  Exam: Physical exam performed. The pertinent findings include: ***  Lab Tests: I ordered, and personally interpreted labs.  The pertinent results include:  ***   Imaging Studies: I ordered imaging studies including ***. I independently visualized and interpreted imaging which showed ***. I agree with the radiologist interpretation.   Cardiac Monitoring:  The patient was maintained on a cardiac monitor.  My attending physician Dr. Marland Kitchen viewed and interpreted the cardiac monitored which showed an underlying rhythm of: ***. I agree with this interpretation.   Medications: I ordered medication including ***  for ***. Reevaluation of the patient after these medicines showed that the patient {resolved/improved/worsened:23923::"improved"}. I have reviewed the patients home medicines and have made adjustments as needed.  Consultations Obtained: I requested consultation with the ***,  and discussed lab and imaging findings as well as pertinent plan - they recommend: ***   Disposition: After consideration of the diagnostic results and the patients response to treatment, I feel that *** .   I discussed this case with my attending physician Dr. Marland Kitchen who cosigned this note including patient's presenting symptoms, physical exam, and planned diagnostics and interventions. Attending physician stated agreement with plan or made changes to plan which were implemented.    Final Clinical Impression(s) / ED Diagnoses Final diagnoses:  None    Rx / DC Orders ED Discharge Orders     None

## 2022-04-06 MED ORDER — OXYCODONE HCL 5 MG PO TABS: 5.0000 mg | ORAL_TABLET | Freq: Four times a day (QID) | ORAL | 0 refills | Status: DC | PRN

## 2022-04-06 NOTE — Discharge Instructions (Signed)
SEEK IMMEDIATE MEDICAL ATTENTION IF: Severe back pain not relieved with medications.  Increasing pain in any areas of the body (such as chest or abdominal pain).  Shortness of breath, dizziness or fainting.  Nausea (feeling sick to your stomach), vomiting, fever, or sweats.

## 2022-04-09 ENCOUNTER — Encounter: Payer: Self-pay | Admitting: Nurse Practitioner

## 2022-04-10 LAB — CULTURE, BLOOD (ROUTINE X 2)
Culture: NO GROWTH
Culture: NO GROWTH

## 2022-04-15 ENCOUNTER — Other Ambulatory Visit: Payer: Self-pay

## 2022-04-15 ENCOUNTER — Emergency Department (HOSPITAL_BASED_OUTPATIENT_CLINIC_OR_DEPARTMENT_OTHER)
Admission: EM | Admit: 2022-04-15 | Discharge: 2022-04-15 | Disposition: A | Discharge status: Home / Self Care | Payer: Medicare Other | Attending: Emergency Medicine | Admitting: Emergency Medicine

## 2022-04-15 ENCOUNTER — Encounter (HOSPITAL_BASED_OUTPATIENT_CLINIC_OR_DEPARTMENT_OTHER): Payer: Self-pay | Admitting: Emergency Medicine

## 2022-04-15 ENCOUNTER — Ambulatory Visit
Admission: EM | Admit: 2022-04-15 | Discharge: 2022-04-15 | Disposition: A | Discharge status: Home / Self Care | Payer: Medicare Other

## 2022-04-15 DIAGNOSIS — X58XXXA Exposure to other specified factors, initial encounter: Secondary | ICD-10-CM | POA: Insufficient documentation

## 2022-04-15 DIAGNOSIS — T162XXA Foreign body in left ear, initial encounter: Secondary | ICD-10-CM

## 2022-04-15 DIAGNOSIS — Z7902 Long term (current) use of antithrombotics/antiplatelets: Secondary | ICD-10-CM | POA: Diagnosis not present

## 2022-04-15 NOTE — ED Triage Notes (Signed)
Pt states he went for a hearing test today and they found the dome to his hearing aid inside his ear. Pt is unsure how long it has been in there.

## 2022-04-15 NOTE — ED Provider Notes (Signed)
[place] EMERGENCY DEPT Provider Note   CSN: [id number] Arrival date & time: 04/15/22  1344     History  Chief Complaint  Patient presents with   Foreign Body in [place] is a 76 y.o. male.  Patient presents ER chief complaint of foreign body left in his left ear.  He is not sure how long it was there for but when he tried to get his hearing aid check today they noted that the rubber end of it has been stuck in his left ear.  Denies any pain.  Denies any fevers or cough or vomiting or diarrhea.       Home Medications Prior to Admission medications   Medication Sig Start Date End Date Taking? Authorizing Provider  acetaminophen (TYLENOL) 325 MG tablet Take 2 tablets (650 mg total) by mouth every 6 (six) hours as needed for mild pain (or Fever >/= 101). Patient not taking: Reported on 04/05/2022 01/05/22   Nolberto Hanlon, MD  acetaminophen (TYLENOL) 500 MG tablet Take 1,000 mg by mouth every 6 (six) hours as needed (for pain- usually between doses of plain Oxycodone).    [provider]  alfuzosin (UROXATRAL) 10 MG 24 hr tablet Take 10 mg by mouth at bedtime.    [provider]  ascorbic acid (VITAMIN C) 1000 MG tablet Take 1,000 mg by mouth daily.    [provider]  aspirin EC 325 MG tablet Take 325 mg by mouth daily. 04/01/22 05/01/22  [provider]  atorvastatin (LIPITOR) 40 MG tablet Take 40 mg by mouth at bedtime. 09/01/19   [provider]  b complex vitamins capsule Take 1 capsule by mouth daily.    [provider]  CALCIUM PO Take 1,200 mg by mouth daily.    [provider]  Cholecalciferol 50 MCG (2000 UT) CAPS Take 2,000 Units by mouth daily.    [provider]  COVID-19 mRNA bivalent vaccine, Pfizer, (PFIZER COVID-19 VAC BIVALENT) injection Inject into the muscle. Patient not taking: Reported on 04/05/2022 03/07/22   Carlyle Basques, MD  ferrous sulfate 325 (65 FE) MG  tablet Take 325 mg by mouth daily with breakfast.    [provider]  gabapentin (NEURONTIN) 300 MG capsule Take 300 mg by mouth 3 (three) times daily. 04/01/22   [provider]  Lidocaine 4 % PTCH Apply 2 patches topically daily. Patient not taking: Reported on 04/05/2022    [provider]  lidocaine 4 % Place onto the skin. Patient not taking: Reported on 04/05/2022    [provider]  methocarbamol (ROBAXIN) 750 MG tablet Take 750 mg by mouth 3 (three) times daily. 04/01/22 04/15/22  [provider]  Multiple Vitamins-Iron (MULTIVITAMIN/IRON PO) Take 1 tablet by mouth daily with breakfast.    [provider]  NIFEdipine (ADALAT CC) 30 MG 24 hr tablet Take 1 tablet (30 mg total) by mouth daily. Patient not taking: Reported on 04/05/2022 03/10/22   Tolia, Sunit, DO  oxyCODONE (ROXICODONE) 5 MG immediate release tablet Take 1 tablet (5 mg total) by mouth every 6 (six) hours as needed for severe pain. 04/06/22   Ripley Fraise, MD  telmisartan (MICARDIS) 80 MG tablet Take 80 mg by mouth daily.    [provider]  zinc gluconate 50 MG tablet Take 50 mg by mouth daily.    [provider]      Allergies    Amlodipine besylate    Review of Systems  Review of Systems  Constitutional:  Negative for fever.  HENT:  Negative for ear pain and sore throat.   Eyes:  Negative for pain.  Respiratory:  Negative for cough.   Cardiovascular:  Negative for chest pain.  Gastrointestinal:  Negative for abdominal pain.  Genitourinary:  Negative for flank pain.  Musculoskeletal:  Negative for back pain.  Skin:  Negative for color change and rash.  Neurological:  Negative for syncope.  All other systems reviewed and are negative.   Physical Exam Updated Vital Signs BP (!) 150/70 (BP Location: Right Arm)   Pulse 73   Temp 97.6 F (36.4 C) (Oral)   Resp 18   Ht '5\' 7"'$  (1.702 m)   Wt 70.3 kg   SpO2 95%   BMI 24.28 kg/m  Physical  Exam Constitutional:      Appearance: He is well-developed.  HENT:     Head: Normocephalic.     Nose: Nose normal.  Eyes:     Extraocular Movements: Extraocular movements intact.     Comments: Hearing aid appropriately in place in the right ear.  Left ear has a small rubber gasket tip of his hearing aid left in place.  No bleeding noted.  Cardiovascular:     Rate and Rhythm: Normal rate.  Pulmonary:     Effort: Pulmonary effort is normal.  Skin:    Coloration: Skin is not jaundiced.  Neurological:     Mental Status: He is alert. Mental status is at baseline.     ED Results / Procedures / Treatments   Labs (all labs ordered are listed, but only abnormal results are displayed) Labs Reviewed - No data to display  EKG None  Radiology No results found.  Procedures Procedures    Medications Ordered in ED Medications - No data to display  ED Course/ Medical Decision Making/ A&P                           Medical Decision Making  Review of external records shows telephone call yesterday with the primary team.  History from family at bedside.  No additional Mordecai Rasmussen studies were sent.  Cardiac monitoring shows sinus rhythm.  Alligator forceps used to grasp the rubber foreign body on first attempt and successful removal with minimal discomfort.  Patient Toller procedure well.  Advised outpatient follow-up with his doctor within the week.  No additional foreign body noted on evaluation afterwards.  Advised immediate return if he has fevers vomiting pain or any additional concerns.        Final Clinical Impression(s) / ED Diagnoses Final diagnoses:  Foreign body of left ear, initial encounter    Rx / DC Orders ED Discharge Orders     None         Luna Fuse, MD 04/15/22 1609

## 2022-04-15 NOTE — ED Triage Notes (Signed)
Went to have hearing testing today due to decreased hearing for past week, was told he has foreign body in left ear. Has hearing aid dome in ear canal. Urgent care sent to ER

## 2022-04-15 NOTE — ED Provider Notes (Signed)
Patient presents to urgent care complaining of the soft rubber dome of an old hearing aid being lodged in his left ear.  Patient states he was seen by the technician performing his hearing aid test.  Patient states he has no idea how long it has been there.  Per my quick exam, there is definitely the tip of a hearing aid well-seated in the distal aspect of his left ear canal. At this time, patient denies pain in his left ear but endorses some hearing loss. Unfortunately, I do not have the proper instrumentation to remove it.  Patient and wife advised.  Wife states this has happened before and that a provider to equal physician was able to remove it.  Wife advised that my best recommendation would be for her to give them a call and if they are unable to remove it today, make an appointment to have it removed and go to the emergency room if he begins to feel any pain in his ears.     Lynden Oxford Scales, PA-C 04/15/22 1326

## 2022-04-23 NOTE — Progress Notes (Signed)
Date:  04/24/2022   ID:  Cody Gonzalez, DOB 11/30/1945, MRN [id number]  PCP:  [name], FNP  Cardiologist:  Alethia Berthold, DO, [place] (established care 01/20/2022)  Date: 04/24/22 Last Office Visit: 01/20/2022  Chief Complaint  Patient presents with   Hypertension   Leg Swelling   Follow-up    HPI  Cody Gonzalez is a 76 y.o. Caucasian male whose past medical history and cardiovascular risk factors include: Benign essential hypertension, mixed hyperlipidemia, gastritis/iron deficiency anemia, prostate cancer, chronic hyponatremia, advanced age.  He is referred to the office at the request of [name], FNP for evaluation of orthostatic hypotension.  Patient is accompanied by his wife [name] at today's office visit who also provides collateral history as part of today's encounter.  Patient presents for urgent visit at the request of his PCP for medication management and bilateral leg edema.  Recent DVT study was negative and ankle swelling did not improve with use of compression stockings.  Patient was recently seen by PCP who advised him to discontinue nifedipine.  Patient stopped nifedipine approximately 5 days ago and ankle swelling has essentially resolved.  However blood pressure is now uncontrolled.  He continues to take telmisartan.  Patient is without specific complaints today, denies near syncope, lightheadedness, dizziness.  He is presently walking approximately 1 mile per day and short intervals.  FUNCTIONAL STATUS: Overall functional status is limited due to him ambulating with a walker and recent surgeries as described above.  ALLERGIES: Allergies  Allergen Reactions   Amlodipine Besylate Swelling and Other (See Comments)    Ankles swell    MEDICATION LIST PRIOR TO VISIT: Current Meds  Medication Sig   alfuzosin (UROXATRAL) 10 MG 24 hr tablet Take 10 mg by mouth at bedtime.   ascorbic acid (VITAMIN C) 1000 MG tablet Take 1,000 mg by  mouth daily.   aspirin EC 325 MG tablet Take 325 mg by mouth daily.   atorvastatin (LIPITOR) 40 MG tablet Take 40 mg by mouth at bedtime.   b complex vitamins capsule Take 1 capsule by mouth daily.   calcitonin, salmon, (MIACALCIN/FORTICAL) 200 UNIT/ACT nasal spray Place into the nose.   CALCIUM PO Take 1,200 mg by mouth daily.   Cholecalciferol 50 MCG (2000 UT) CAPS Take 2,000 Units by mouth daily.   ferrous sulfate 325 (65 FE) MG tablet Take 325 mg by mouth daily with breakfast.   HYDROcodone-acetaminophen (NORCO) 10-325 MG tablet Take 1 tablet by mouth every 6 (six) hours as needed.   Lidocaine 4 % PTCH Apply 2 patches topically daily.   metoprolol succinate (TOPROL-XL) 25 MG 24 hr tablet Take 1 tablet (25 mg total) by mouth daily. Take with or immediately following a meal.   Multiple Vitamins-Iron (MULTIVITAMIN/IRON PO) Take 1 tablet by mouth daily with breakfast.   telmisartan (MICARDIS) 80 MG tablet Take 80 mg by mouth daily.   [DISCONTINUED] NIFEdipine (ADALAT CC) 30 MG 24 hr tablet Take 1 tablet (30 mg total) by mouth daily.     PAST MEDICAL HISTORY: Past Medical History:  Diagnosis Date   Anemia    Hypertension    Prostate cancer ([place])    Sleep apnea    uses cpap    TBI (traumatic brain injury) ([place]) 2015    PAST SURGICAL HISTORY: Past Surgical History:  Procedure Laterality Date   BACK SURGERY     BILATERAL CARPAL TUNNEL RELEASE  2014   CYSTOSCOPY N/A 06/29/2019   Procedure: CYSTOSCOPY;  Surgeon: Alexis Frock, MD;  Location: [facility];  Service: Urology;  Laterality: N/A;  No seeds in bladder per Dr. Lilli Light LAMINECTOMY/DECOMPRESSION MICRODISCECTOMY Left 05/28/2021   Procedure: Left  - Lumbar five-Sacral one Laminectomy resection of synovial cyst;  Surgeon: Earnie Larsson, MD;  Location: [facility];  Service: Neurosurgery;  Laterality: Left;   PROSTATE BIOPSY     RADIOACTIVE SEED IMPLANT N/A 06/29/2019   Procedure: RADIOACTIVE SEED  IMPLANT/BRACHYTHERAPY IMPLANT;  Surgeon: Alexis Frock, MD;  Location: [facility];  Service: Urology;  Laterality: N/A;  [place] N/A 06/29/2019   Procedure: SPACE OAR INSTILLATION;  Surgeon: Alexis Frock, MD;  Location: [facility];  Service: Urology;  Laterality: N/A;    FAMILY HISTORY: The patient family history includes Brain cancer in his father.  SOCIAL HISTORY:  The patient  reports that he has never smoked. He has never used smokeless tobacco. He reports that he does not currently use alcohol after a past usage of about 2.0 standard drinks of alcohol per week. He reports that he does not use drugs.  REVIEW OF SYSTEMS: Review of Systems  Cardiovascular:  Negative for chest pain, dyspnea on exertion, leg swelling, near-syncope, orthopnea, palpitations, paroxysmal nocturnal dyspnea and syncope.  Respiratory:  Negative for shortness of breath.     PHYSICAL EXAM:    04/24/2022    8:35 AM 04/15/2022    4:15 PM 04/15/2022    4:08 PM  Vitals with BMI  Height '5\' 7"'$     Weight 159 lbs 3 oz    BMI 27.06    Systolic 237 628 315  Diastolic 67 68 70  Pulse 97 76 73   Orthostatic VS for the past 72 hrs (Last 3 readings):  Patient Position BP Location Cuff Size  04/24/22 0835 Sitting Left Arm Normal   CONSTITUTIONAL: Age appropriate, ambulate w/ cane, hemodynamically stable, well-nourished. No acute distress. Back brace in place.  SKIN: Skin is warm and dry. No rash noted. No cyanosis. No pallor. No jaundice HEAD: Normocephalic and atraumatic.  EYES: No scleral icterus MOUTH/THROAT: Moist oral membranes.  NECK: No JVD present. No thyromegaly noted. No carotid bruits  LYMPHATIC: No visible cervical adenopathy.  CHEST Normal respiratory effort. No intercostal retractions. Back brace.  LUNGS: Clear to auscultation bilaterally.  No stridor. No wheezes. No rales.  CARDIOVASCULAR: Regular rate and rhythm, positive S1-S2, no murmurs  rubs or gallops appreciated. ABDOMINAL: Soft, nontender, nondistended, positive bowel sounds in all 4 quadrants, no apparent ascites.  EXTREMITIES: Warm to touch, 2+ pitting edema in the left lower extremity. HEMATOLOGIC: No significant bruising NEUROLOGIC: Oriented to person, place, and time. Nonfocal. Normal muscle tone.  PSYCHIATRIC: Normal mood and affect. Normal behavior. Cooperative  CARDIAC DATABASE: EKG: 01/20/2022: NSR, 69 bpm, normal axis, without underlying ischemia injury pattern.    Echocardiogram: 01/27/2022:   1. Left ventricular ejection fraction, by estimation, is 60 to 65%. The left ventricle has normal function. The left ventricle has no regional wall motion abnormalities. Left ventricular diastolic parameters were normal. The average left ventricular  global longitudinal strain is -22.9 %. The global longitudinal strain is normal.   2. Right ventricular systolic function is normal. The right ventricular size is normal.   3. Left atrial size was moderately dilated.   4. The mitral valve is grossly normal. Mild mitral valve regurgitation.  No evidence of mitral stenosis.   5. The aortic valve is grossly normal. Aortic valve regurgitation  is trivial. Aortic valve sclerosis is present, with no evidence of aortic valve stenosis.   6. The inferior vena cava is normal in size with greater than 50% respiratory variability, suggesting right atrial pressure of 3 mmHg.    Stress Testing: No results found for this or any previous visit from the past 1095 days.  Heart Catheterization: None  Lower extremity venous duplex bilaterally 02/19/2022: RIGHT:  There is no evidence of deep vein thrombosis in the lower extremity. No cystic structure found in the popliteal fossa.   LEFT: There is no evidence of deep vein thrombosis in the lower extremity. No cystic structure found in the popliteal fossa.   LABORATORY DATA:    Latest Ref Rng & Units 04/05/2022    5:27 PM 02/20/2022   10:47  PM 02/10/2022    1:00 PM  CBC  WBC 4.0 - 10.5 K/uL 6.8  4.3  4.8   Hemoglobin 13.0 - 17.0 g/dL 10.1  10.0  10.9   Hematocrit 39.0 - 52.0 % 29.4  28.9  33.4   Platelets 150 - 400 K/uL 305  252  296        Latest Ref Rng & Units 04/05/2022    5:27 PM 02/20/2022   10:47 PM 02/10/2022    1:00 PM  CMP  Glucose 70 - 99 mg/dL 119  103  110   BUN 8 - 23 mg/dL '12  17  16   '$ Creatinine 0.61 - 1.24 mg/dL 0.63  0.71  0.72   Sodium 135 - 145 mmol/L 122  125  127   Potassium 3.5 - 5.1 mmol/L 4.0  4.0  4.7   Chloride 98 - 111 mmol/L 87  94  95   CO2 22 - 32 mmol/L '26  24  21   '$ Calcium 8.9 - 10.3 mg/dL 8.6  8.6  8.8     Lipid Panel  No results found for: "CHOL", "TRIG", "HDL", "CHOLHDL", "VLDL", "LDLCALC", "LDLDIRECT", "LABVLDL"  No components found for: "NTPROBNP" No results for input(s): "PROBNP" in the last 8760 hours. No results for input(s): "TSH" in the last 8760 hours.  BMP Recent Labs    02/10/22 1300 02/20/22 2247 04/05/22 1727  NA 127* 125* 122*  K 4.7 4.0 4.0  CL 95* 94* 87*  CO2 21* 24 26  GLUCOSE 110* 103* 119*  BUN '16 17 12  '$ CREATININE 0.72 0.71 0.63  CALCIUM 8.8* 8.6* 8.6*  GFRNONAA >60 >60 >60    HEMOGLOBIN A1C No results found for: "HGBA1C", "MPG"  Comp Metabolic Panel   6503-54-65    Glucose 91   70-99  BUN 17   6-26  Creatinine 0.73   0.60-1.30  eGFR2021 94   >60  Sodium 127   136-145  Potassium 4.9   3.5-5.5  Chloride 91   98-107  CO2 30   22-32  Anion Gap 10.7   6.0-20.0  Calcium 9.8   8.6-10.3  CA-corrected 9.08   8.60-10.30  Protein, Total 7.2   6.0-8.3  Albumin 4.7   3.4-4.8  TBIL 0.9   0.3-1.0  ALP 73   38-126  AST 22   0-39  ALT 18   0-52  Lipid Panel w/reflex   2021-09-03    Cholesterol 155   <200  CHOL/HDL 2.2   2.0-4.0  HDLD 72   30-70  Triglyceride 113   0-199  NHDL 83   0-129  LDL Chol Calc (NIH) 63   0-99  LDL Chol Calc (NIH) 63  0-99  Colon/EGD   2021-08-05    CBC with Diff   2021-02-28    WBC 4.4   4.0-11.0  RBC 3.64    4.20-5.80  HGB 12.4   13.0-17.0  HCT 35.1   39.0-52.0  MCV 96.4   80.0-94.0  MCH 34.2   27.0-33.0  MCHC 35.4   32.0-36.0  RDW 13.3   11.5-15.5  PLT 208      IMPRESSION:    ICD-10-CM   1. Left leg swelling  M79.89     2. Benign hypertension  I10        RECOMMENDATIONS: Cody Gonzalez is a 76 y.o. Caucasian male whose past medical history and cardiac risk factors include: Benign essential hypertension, mixed hyperlipidemia, gastritis/iron deficiency anemia, prostate cancer, chronic hyponatremia, advanced age.   Left leg swelling DVT study negative, details above Leg swelling has resolved since discontinuing nifedipine per PCP  Benign hypertension No longer experiencing orthostatic hypotension/near syncope/syncope. Blood pressure is uncontrolled in the office and on home monitoring Continue telmisartan Start metoprolol succinate 25 mg p.o. each morning. Currently enrolled into principal care management for ambulatory blood pressure monitoring  Orthostatic hypotension / Near syncope No recurrence.  We will continue to monitor closely with addition of metoprolol as per above.  Mixed hyperlipidemia Continue atorvastatin He denies myalgia or other side effects. We will defer further management to PCP.  Chronic hyponatremia Chronic and stable. We will continue to avoid diuretic therapy.  FINAL MEDICATION LIST END OF ENCOUNTER: Meds ordered this encounter  Medications   metoprolol succinate (TOPROL-XL) 25 MG 24 hr tablet    Sig: Take 1 tablet (25 mg total) by mouth daily. Take with or immediately following a meal.    Dispense:  30 tablet    Refill:  3    Medications Discontinued During This Encounter  Medication Reason   acetaminophen (TYLENOL) 325 MG tablet    acetaminophen (TYLENOL) 500 MG tablet    zinc gluconate 50 MG tablet    oxyCODONE (ROXICODONE) 5 MG immediate release tablet    COVID-19 mRNA bivalent vaccine, Pfizer, (PFIZER COVID-19 VAC BIVALENT)  injection    gabapentin (NEURONTIN) 300 MG capsule    lidocaine 4 %    NIFEdipine (ADALAT CC) 30 MG 24 hr tablet Side effect (s)     Current Outpatient Medications:    alfuzosin (UROXATRAL) 10 MG 24 hr tablet, Take 10 mg by mouth at bedtime., Disp: , Rfl:    ascorbic acid (VITAMIN C) 1000 MG tablet, Take 1,000 mg by mouth daily., Disp: , Rfl:    aspirin EC 325 MG tablet, Take 325 mg by mouth daily., Disp: , Rfl:    atorvastatin (LIPITOR) 40 MG tablet, Take 40 mg by mouth at bedtime., Disp: , Rfl:    b complex vitamins capsule, Take 1 capsule by mouth daily., Disp: , Rfl:    calcitonin, salmon, (MIACALCIN/FORTICAL) 200 UNIT/ACT nasal spray, Place into the nose., Disp: , Rfl:    CALCIUM PO, Take 1,200 mg by mouth daily., Disp: , Rfl:    Cholecalciferol 50 MCG (2000 UT) CAPS, Take 2,000 Units by mouth daily., Disp: , Rfl:    ferrous sulfate 325 (65 FE) MG tablet, Take 325 mg by mouth daily with breakfast., Disp: , Rfl:    HYDROcodone-acetaminophen (NORCO) 10-325 MG tablet, Take 1 tablet by mouth every 6 (six) hours as needed., Disp: , Rfl:    Lidocaine 4 % PTCH, Apply 2 patches topically daily., Disp: , Rfl:    metoprolol  succinate (TOPROL-XL) 25 MG 24 hr tablet, Take 1 tablet (25 mg total) by mouth daily. Take with or immediately following a meal., Disp: 30 tablet, Rfl: 3   Multiple Vitamins-Iron (MULTIVITAMIN/IRON PO), Take 1 tablet by mouth daily with breakfast., Disp: , Rfl:    telmisartan (MICARDIS) 80 MG tablet, Take 80 mg by mouth daily., Disp: , Rfl:   No orders of the defined types were placed in this encounter.   There are no Patient Instructions on file for this visit.   --Continue cardiac medications as reconciled in final medication list. --No follow-ups on file. Or sooner if needed. --Continue follow-up with your primary care physician regarding the management of your other chronic comorbid conditions.  Patient's questions and concerns were addressed to his satisfaction. He  voices understanding of the instructions provided during this encounter.   This note was created using a voice recognition software as a result there may be grammatical errors inadvertently enclosed that do not reflect the nature of this encounter. Every attempt is made to correct such errors.   Alethia Berthold, PA-C 04/24/2022, 9:28 AM Office: [phone]

## 2022-04-24 ENCOUNTER — Ambulatory Visit: Payer: Medicare Other | Admitting: Student

## 2022-04-24 ENCOUNTER — Encounter: Payer: Self-pay | Admitting: Student

## 2022-04-24 VITALS — BP 160/67 | HR 97 | Temp 98.2°F | Resp 16 | Ht 67.0 in | Wt 159.2 lb

## 2022-04-24 DIAGNOSIS — M7989 Other specified soft tissue disorders: Secondary | ICD-10-CM

## 2022-04-24 DIAGNOSIS — I10 Essential (primary) hypertension: Secondary | ICD-10-CM

## 2022-04-24 MED ORDER — METOPROLOL SUCCINATE ER 25 MG PO TB24: 25.0000 mg | ORAL_TABLET | Freq: Every day | ORAL | 3 refills | Status: DC

## 2022-05-07 ENCOUNTER — Ambulatory Visit (INDEPENDENT_AMBULATORY_CARE_PROVIDER_SITE_OTHER): Payer: Medicare Other | Admitting: Podiatry

## 2022-05-07 ENCOUNTER — Encounter: Payer: Self-pay | Admitting: Podiatry

## 2022-05-07 DIAGNOSIS — B351 Tinea unguium: Secondary | ICD-10-CM | POA: Diagnosis not present

## 2022-05-07 DIAGNOSIS — M79674 Pain in right toe(s): Secondary | ICD-10-CM | POA: Diagnosis not present

## 2022-05-07 DIAGNOSIS — M79675 Pain in left toe(s): Secondary | ICD-10-CM | POA: Diagnosis not present

## 2022-05-07 NOTE — Progress Notes (Signed)
This patient returns to the office for evaluation and treatment of long thick painful nails .  This patient is unable to trim his own nails since the patient cannot reach his feet.  Patient says the nails are painful walking and wearing his shoes.  He returns for preventive foot care services.  General Appearance  Alert, conversant and in no acute stress.  Vascular  Dorsalis pedis and posterior tibial  pulses are palpable  bilaterally.  Capillary return is within normal limits  bilaterally. Temperature is within normal limits  bilaterally.  Neurologic  Senn-Weinstein monofilament wire test within normal limits  bilaterally. Muscle power within normal limits bilaterally.  Nails Thick disfigured discolored nails with subungual debris  from hallux to fifth toes bilaterally. No evidence of bacterial infection or drainage bilaterally.  Orthopedic  No limitations of motion  feet .  No crepitus or effusions noted.  No bony pathology or digital deformities noted.  Skin  normotropic skin with no porokeratosis noted bilaterally.  No signs of infections or ulcers noted.     Onychomycosis  Pain in toes right foot  Pain in toes left foot  Debridement  of nails  1-5  B/L with a nail nipper.  Nails were then filed using a dremel tool with no incidents.    RTC  3 months    Jessicia Napolitano DPM  

## 2022-05-19 ENCOUNTER — Ambulatory Visit: Payer: Medicare Other | Admitting: Neurology

## 2022-05-27 ENCOUNTER — Other Ambulatory Visit: Payer: Self-pay | Admitting: Physician Assistant

## 2022-05-27 DIAGNOSIS — M818 Other osteoporosis without current pathological fracture: Secondary | ICD-10-CM

## 2022-05-29 ENCOUNTER — Ambulatory Visit
Admission: RE | Admit: 2022-05-29 | Discharge: 2022-05-29 | Disposition: A | Discharge status: Home / Self Care | Payer: Medicare Other | Source: Ambulatory Visit | Attending: Physician Assistant | Admitting: Physician Assistant

## 2022-05-29 DIAGNOSIS — M818 Other osteoporosis without current pathological fracture: Secondary | ICD-10-CM

## 2022-06-10 ENCOUNTER — Other Ambulatory Visit: Payer: Self-pay

## 2022-06-10 DIAGNOSIS — I951 Orthostatic hypotension: Secondary | ICD-10-CM

## 2022-06-10 MED ORDER — CARVEDILOL 6.25 MG PO TABS: 6.2500 mg | ORAL_TABLET | Freq: Two times a day (BID) | ORAL | 0 refills | Status: DC

## 2022-06-10 NOTE — Progress Notes (Signed)
Patient's home BP's monitored by wife [name] were elevated. Patient has history of orthostasis. Changing metoprolol to carvedilol 6.'25mg'$  BID per Dr. Terri Skains. Will continue to monitor and uptritrate as needed.

## 2022-06-12 ENCOUNTER — Other Ambulatory Visit: Payer: Self-pay

## 2022-06-12 DIAGNOSIS — D649 Anemia, unspecified: Secondary | ICD-10-CM

## 2022-06-13 ENCOUNTER — Other Ambulatory Visit: Payer: Self-pay

## 2022-06-13 ENCOUNTER — Inpatient Hospital Stay (HOSPITAL_BASED_OUTPATIENT_CLINIC_OR_DEPARTMENT_OTHER): Payer: Medicare Other | Admitting: Physician Assistant

## 2022-06-13 ENCOUNTER — Inpatient Hospital Stay: Payer: Medicare Other | Attending: Physician Assistant

## 2022-06-13 ENCOUNTER — Telehealth: Payer: Self-pay | Admitting: Physician Assistant

## 2022-06-13 ENCOUNTER — Encounter: Payer: Self-pay | Admitting: Physician Assistant

## 2022-06-13 VITALS — BP 143/55 | HR 59 | Temp 97.4°F | Resp 14 | Wt 153.7 lb

## 2022-06-13 DIAGNOSIS — E871 Hypo-osmolality and hyponatremia: Secondary | ICD-10-CM | POA: Insufficient documentation

## 2022-06-13 DIAGNOSIS — Z8546 Personal history of malignant neoplasm of prostate: Secondary | ICD-10-CM | POA: Diagnosis not present

## 2022-06-13 DIAGNOSIS — D649 Anemia, unspecified: Secondary | ICD-10-CM | POA: Insufficient documentation

## 2022-06-13 DIAGNOSIS — I10 Essential (primary) hypertension: Secondary | ICD-10-CM | POA: Insufficient documentation

## 2022-06-13 DIAGNOSIS — M4800 Spinal stenosis, site unspecified: Secondary | ICD-10-CM | POA: Diagnosis not present

## 2022-06-13 DIAGNOSIS — M419 Scoliosis, unspecified: Secondary | ICD-10-CM | POA: Insufficient documentation

## 2022-06-13 LAB — CBC WITH DIFFERENTIAL (CANCER CENTER ONLY)
Abs Immature Granulocytes: 0.01 10*3/uL (ref 0.00–0.07)
Basophils Absolute: 0 10*3/uL (ref 0.0–0.1)
Basophils Relative: 0 %
Eosinophils Absolute: 0.1 10*3/uL (ref 0.0–0.5)
Eosinophils Relative: 5 %
HCT: 27.5 % — ABNORMAL LOW (ref 39.0–52.0)
Hemoglobin: 9.5 g/dL — ABNORMAL LOW (ref 13.0–17.0)
Immature Granulocytes: 0 %
Lymphocytes Relative: 11 %
Lymphs Abs: 0.3 10*3/uL — ABNORMAL LOW (ref 0.7–4.0)
MCH: 32.8 pg (ref 26.0–34.0)
MCHC: 34.5 g/dL (ref 30.0–36.0)
MCV: 94.8 fL (ref 80.0–100.0)
Monocytes Absolute: 0.3 10*3/uL (ref 0.1–1.0)
Monocytes Relative: 12 %
Neutro Abs: 1.9 10*3/uL (ref 1.7–7.7)
Neutrophils Relative %: 72 %
Platelet Count: 217 10*3/uL (ref 150–400)
RBC: 2.9 MIL/uL — ABNORMAL LOW (ref 4.22–5.81)
RDW: 15.9 % — ABNORMAL HIGH (ref 11.5–15.5)
WBC Count: 2.7 10*3/uL — ABNORMAL LOW (ref 4.0–10.5)
nRBC: 0 % (ref 0.0–0.2)

## 2022-06-13 LAB — CMP (CANCER CENTER ONLY)
ALT: 12 U/L (ref 0–44)
AST: 14 U/L — ABNORMAL LOW (ref 15–41)
Albumin: 4.1 g/dL (ref 3.5–5.0)
Alkaline Phosphatase: 138 U/L — ABNORMAL HIGH (ref 38–126)
Anion gap: 6 (ref 5–15)
BUN: 38 mg/dL — ABNORMAL HIGH (ref 8–23)
CO2: 26 mmol/L (ref 22–32)
Calcium: 8.6 mg/dL — ABNORMAL LOW (ref 8.9–10.3)
Chloride: 95 mmol/L — ABNORMAL LOW (ref 98–111)
Creatinine: 0.6 mg/dL — ABNORMAL LOW (ref 0.61–1.24)
GFR, Estimated: >60 mL/min (ref >60)
Glucose, Bld: 103 mg/dL — ABNORMAL HIGH (ref 70–99)
Potassium: 4.3 mmol/L (ref 3.5–5.1)
Sodium: 127 mmol/L — ABNORMAL LOW (ref 135–145)
Total Bilirubin: 0.8 mg/dL (ref 0.3–1.2)
Total Protein: 6.5 g/dL (ref 6.5–8.1)

## 2022-06-13 LAB — SAMPLE TO BLOOD BANK

## 2022-06-13 LAB — IRON AND IRON BINDING CAPACITY (CC-WL,HP ONLY)
Iron: 60 ug/dL (ref 45–182)
Saturation Ratios: 21 % (ref 17.9–39.5)
TIBC: 284 ug/dL (ref 250–450)
UIBC: 224 ug/dL (ref 117–376)

## 2022-06-13 LAB — FERRITIN: Ferritin: 597 ng/mL — ABNORMAL HIGH (ref 24–336)

## 2022-06-13 LAB — VITAMIN B12: Vitamin B-12: 439 pg/mL (ref 180–914)

## 2022-06-13 NOTE — Progress Notes (Signed)
[facility] OFFICE PROGRESS NOTE  [name], FNP [place] [address]  DIAGNOSIS: Anemia   PRIOR THERAPY: None  CURRENT THERAPY: 1) ferrous sulfate 325 mg p.o. daily  INTERVAL HISTORY: Cody Gonzalez 76 y.o. male returns to the clinic today for a follow-up visit accompanied by his wife.  The patient was first seen in the clinic on 01/29/22 by my colleague Lacie and Dr. Burr Medico.  The patient has a complex history with numerous back surgeries.  He has had 6 back surgeries over the last year. He first developed mild anemia in July 2022 after the first back surgery. He developed severe anemia during admission for an epidural abscess which required blood transfusions.  Since last being seen, the patient was hospitalized again in July 2023 for his back and underwent a T10-T4 spinal fusion surgery. The patient's wife states that per the operation note, he lost about 400 ccs of blood. He did not receive a blood transfusion during this hospitalization but did receive 1 dose of IV iron on 05/16/22. Unclear which type of IV iron he received.  The lowest hemoglobin that I can see during the course of this most recent hospitalization is 7.8.  His hemoglobin was 8.9 on discharge.  He is followed by Dr. Louanne Skye from orthopedic surgery.  He is being followed for a seroma on his back.  This area is not erythematous, tender, hot, and does not have any drainage.  His hospitalization was complicated by hyponatremia and delirium for which he is following with nephrology. He sees them again later this month. He is on a 1 L fluid restriction and a prescription medication.   Of note the patient has a history of prostate cancer.  The patient's wife states that he follows closely with Dr. Tammi Klippel from urology and that his prostate cancer is "under good control"  He had lab work drawn at his PCPs office earlier this week which showed a hemoglobin of 9.3 elevated RDW at 18.0. he had  a normal platelet count and borderline low white blood count at 3.8.   Overall, the patient reports baseline fatigue which has been stable over the last couple months.  Of course, he is not at his baseline where he was approximately 1 year ago prior to his surgeries.  Denies any lightheadedness, syncope, shortness of breath, or chest pain.  He is taking a 325 mg aspirin which causes some easy bruising on his upper extremities.  The patient is supposed to be taking this for 1 month after his surgery.  He is expected to stop taking aspirin for this month.  Denies any abnormal bleeding such as gingival bleeding, hemoptysis, hematemesis, melena, hematochezia, or epistaxis.  The patient is taking a over-the-counter 325 mg ferrous sulfate tablet once daily with vitamin C.  The patient is up-to-date on his colonoscopy and EGD which was last performed on 08/05/21 which showed chronic gastritis, diverticulosis in the sigmoid and descending colon, 1 polyp in the sigmoid colon, petechiae in the rectum, and internal hemorrhoids.  Patient and his wife do not eat any red meat due to preference.  He is here today for evaluation and repeat blood work.     MEDICAL HISTORY: Past Medical History:  Diagnosis Date   Anemia    Hypertension    Prostate cancer ([place])    Sleep apnea    uses cpap    TBI (traumatic brain injury) ([place]) 2015    ALLERGIES:  is allergic  to nifedipine and amlodipine besylate.  MEDICATIONS:  Current Outpatient Medications  Medication Sig Dispense Refill   Acetaminophen (TYLENOL 8 HOUR PO)      alfuzosin (UROXATRAL) 10 MG 24 hr tablet Take 10 mg by mouth at bedtime.     ascorbic acid (VITAMIN C) 1000 MG tablet Take 1,000 mg by mouth daily.     aspirin EC 325 MG tablet Take by mouth.     atorvastatin (LIPITOR) 40 MG tablet Take 40 mg by mouth at bedtime.     b complex vitamins capsule Take 1 capsule by mouth daily.     calcitonin, salmon, (MIACALCIN/FORTICAL) 200 UNIT/ACT nasal spray Place  into the nose.     carvedilol (COREG) 6.25 MG tablet Take 1 tablet (6.25 mg total) by mouth 2 (two) times daily. 60 tablet 0   Cholecalciferol 50 MCG (2000 UT) CAPS Take 2,000 Units by mouth daily.     famotidine (PEPCID) 20 MG tablet Take 20 mg by mouth daily.     ferrous sulfate 325 (65 FE) MG tablet Take 325 mg by mouth daily with breakfast.     Lidocaine 4 % PTCH Apply 2 patches topically daily.     Multiple Vitamins-Iron (MULTIVITAMIN/IRON PO) Take 1 tablet by mouth daily with breakfast.     oxyCODONE (OXY IR/ROXICODONE) 5 MG immediate release tablet      polyethylene glycol (MIRALAX) 17 g packet 1 packet mixed with 8 ounces of fluid     telmisartan (MICARDIS) 80 MG tablet Take 80 mg by mouth daily.     urea (URE-NA) 15 g PACK oral packet Take by mouth.     No current facility-administered medications for this visit.    SURGICAL HISTORY:  Past Surgical History:  Procedure Laterality Date   BACK SURGERY     BILATERAL CARPAL TUNNEL RELEASE  2014   CYSTOSCOPY N/A 06/29/2019   Procedure: CYSTOSCOPY;  Surgeon: Alexis Frock, MD;  Location: [facility];  Service: Urology;  Laterality: N/A;  No seeds in bladder per Dr. Lilli Light LAMINECTOMY/DECOMPRESSION MICRODISCECTOMY Left 05/28/2021   Procedure: Left  - Lumbar five-Sacral one Laminectomy resection of synovial cyst;  Surgeon: Earnie Larsson, MD;  Location: [facility];  Service: Neurosurgery;  Laterality: Left;   PROSTATE BIOPSY     RADIOACTIVE SEED IMPLANT N/A 06/29/2019   Procedure: RADIOACTIVE SEED IMPLANT/BRACHYTHERAPY IMPLANT;  Surgeon: Alexis Frock, MD;  Location: [facility];  Service: Urology;  Laterality: N/A;  [place] N/A 06/29/2019   Procedure: SPACE OAR INSTILLATION;  Surgeon: Alexis Frock, MD;  Location: [facility];  Service: Urology;  Laterality: N/A;    REVIEW OF SYSTEMS:   Review of Systems  Constitutional: Positive for stable fatigue.   Negative for appetite change, chills, fever and unexpected weight change.  HENT:   Negative for mouth sores, nosebleeds, sore throat and trouble swallowing.   Eyes: Negative for eye problems and icterus.  Respiratory: Negative for cough, hemoptysis, shortness of breath and wheezing.   Cardiovascular: Negative for chest pain and leg swelling.  Gastrointestinal: Negative for abdominal pain, constipation, diarrhea, nausea and vomiting.  Genitourinary: Negative for bladder incontinence, difficulty urinating, dysuria, frequency and hematuria.   Musculoskeletal: Positive for back pain. Negative for gait problem, neck pain and neck stiffness.  Skin: Negative for itching and rash.  Neurological: Negative for dizziness, extremity weakness, gait problem, headaches, light-headedness and seizures.  Hematological: Negative for adenopathy. Does not bruise/bleed easily.  Psychiatric/Behavioral: Negative  for confusion, depression and sleep disturbance. The patient is not nervous/anxious.     PHYSICAL EXAMINATION:  Blood pressure (!) 143/55, pulse (!) 59, temperature (!) 97.4 F (36.3 C), temperature source Oral, resp. rate 14, weight 153 lb 11.2 oz (69.7 kg), SpO2 100 %.  ECOG PERFORMANCE STATUS: 1-2  Physical Exam  Constitutional: Oriented to person, place, and time and well-developed, well-nourished, and in no distress.  HENT:  Head: Normocephalic and atraumatic.  Mouth/Throat: Oropharynx is clear and moist. No oropharyngeal exudate.  Eyes: Conjunctivae are normal. Right eye exhibits no discharge. Left eye exhibits no discharge. No scleral icterus.  Neck: Normal range of motion. Neck supple.  Cardiovascular: Normal rate, regular rhythm, normal heart sounds and intact distal pulses.   Pulmonary/Chest: Effort normal and breath sounds normal. No respiratory distress. No wheezes. No rales.  Abdominal: Soft. Bowel sounds are normal. Exhibits no distension and no mass. There is no tenderness.   Musculoskeletal: Normal range of motion. Exhibits no edema.  Lymphadenopathy:    No cervical adenopathy.  Neurological: Alert and oriented to person, place, and time. Exhibits normal muscle tone. Gait normal. Coordination normal.  Ambulates with a cane. Skin: Some mild bruising on his upper extremities.  Skin is warm and dry. No rash noted. Not diaphoretic. No erythema. No pallor.  Psychiatric: Mood, memory and judgment normal.  Vitals reviewed.  LABORATORY DATA: Lab Results  Component Value Date   WBC 2.7 (L) 06/13/2022   HGB 9.5 (L) 06/13/2022   HCT 27.5 (L) 06/13/2022   MCV 94.8 06/13/2022   PLT 217 06/13/2022      Chemistry      Component Value Date/Time   NA 127 (L) 06/13/2022 0941   NA 128 (L) 02/04/2022 0919   K 4.3 06/13/2022 0941   CL 95 (L) 06/13/2022 0941   CO2 26 06/13/2022 0941   BUN 38 (H) 06/13/2022 0941   BUN 16 02/04/2022 0919   CREATININE 0.60 (L) 06/13/2022 0941      Component Value Date/Time   CALCIUM 8.6 (L) 06/13/2022 0941   ALKPHOS 138 (H) 06/13/2022 0941   AST 14 (L) 06/13/2022 0941   ALT 12 06/13/2022 0941   BILITOT 0.8 06/13/2022 0941       RADIOGRAPHIC STUDIES:  DG BONE DENSITY (DXA)  Result Date: 05/30/2022 CLINICAL DATA:  76 year old male. History of vertebral fracture. On calcium and vitamin-D. History of lumbar spine surgery/fusion. EXAM: DUAL X-RAY ABSORPTIOMETRY (DXA) FOR BONE MINERAL DENSITY TECHNIQUE: Bone mineral density measurements are performed of the spine, hip, and forearm, as appropriate, per International Society of Clinical Densitometry recommendations. The pertinent regions of interest are reported below. Non-contributory values are not reported. Images are obtained for bone mineral density measurement and are not obtained for diagnostic purposes. FINDINGS: LEFT FEMUR NECK Bone Mineral Density (BMD):  0.830 g/cm2 Young Adult T-Score: -0.7 Z-Score:  0.7 Forearm not accessible for scanning. Unit: This study was  performed at [facility] on the [place] (S/N [phone]), software version 13.4.2. Scan quality: The scan quality is good. Exclusions: Lumbar spine and forearm ASSESSMENT: Patient's diagnostic category is NORMAL by WHO Criteria. FRACTURE RISK:  NOT INCREASED FRAX: World Health Organization FRAX assessment of absolute fracture risk is not calculated for this patient because the patient has T-scores at or above -1.0 and a previous hip or vertebral fracture. COMPARISON: None. RECOMMENDATIONS: 1. All patients should optimize calcium and vitamin D intake. 2. Consider FDA-approved medical therapies in postmenopausal women and men aged 40 years  and older, based on the following: 1. A hip or vertebral (clinical or morphometric) fracture 2. T-score less than or equal to -2.5 and secondary causes have been excluded. 3. Low bone mass (T-score between -1.0 and -2.5) and a 10-year probability of a hip fracture greater than or equal to 3% or a 10-year probability of a major osteoporosis-related fracture greater than or equal to 20% based on the [place]-adapted WHO algorithm. 4. Clinician judgment and/or patient preferences may indicate treatment for people with 10-year fracture probabilities above or below these levels 3. Patients with diagnosis of osteoporosis or at high risk for fracture should have regular bone mineral density tests.? For patients eligible for Medicare, routine testing is allowed once every 2 years.? The testing frequency can be increased to one year for patients who have rapidly progressing disease, those who are receiving or discontinuing medical therapy to restore bone mass, or have additional risk factors. Electronically Signed   By: Franki Cabot M.D.   On: 05/30/2022 09:51     ASSESSMENT & PLAN:   Is a very pleasant 76 year old Caucasian male with:    Normocytic anemia -We reviewed his extensive medical record in detail with the patient and spouse -He presented with mild anemia 05/2021  during first back surgery, anemia worsened with each subsequent surgery.  He has had a total of 6 surgeries in the last year. -He developed severe anemia during admission for epidural abscess, Hgb down to 6.8, he required blood transfusions.  -No evidence of chronic kidney disease.   -He has no clinical evidence of active malignancy.  He follows with Dr. Tresa Moore history of prostate cancer and states that this is under control. -Today's labs from his initial work-up negative for hemolysis.   -Iron and B12 are normal  -We will follow-up on the folic acid and MMA normal to rule out nutritional anemia.  -The patient had additional back surgery in mid July 2023.  The patient's wife mentions that he reportedly lost around 400 cc of blood during his admission.  He received IV iron during his admission on 7/14/thousand 23. -Today (06/13/2022), the patient's hemoglobin is 9.5.  His RDW slightly elevated at 15.9.  His platelet count is within normal limits.  His iron studies are within normal limits.  His B12 and ferritin are still pending at this time.  His white blood cell count is low at 2.7.  I reviewed his labs with Dr. Burr Medico. -It is felt that his anemia secondary to numerous back surgeries and blood loss.  He has no more upcoming back surgeries.  Encouraged to continue taking his iron supplement with vitamin C and we will monitor his labs closely for continued improvement.  His iron studies within normal limits.  If his ferritin is still low we may consider additional IV iron.  I will call the patient on Monday with the results of his ferritin and vitamin B12.  - Dr. Burr Medico recommends a lab only visit in 2 months and a lab follow-up visit in 4 months. -No additional workup needed for the leukopenia at this time per Dr. Burr Medico.  We will continue to monitor.  Reviewed neutropenic precautions.  He denies any signs and symptoms at this time. -We will see him back with lab and follow-up in 4 months -I discussed the  patient's case with Dr. Burr Medico   Spinal stenosis, scoliosis, multiple back surgeries, epidural abscess -managed by [facility] and neurosurgery Dr. Jaynee Eagles -Epidural abscess managed by ID, he completed 6 weeks IV  vancomycin and ceftriaxone per PICC line on 01/27/2022 -Switch to oral doxycycline 01/28/2022 for an additional 6 weeks -Had additional back surgery in July 2023.  Reportedly lost approximately 400 cc of blood.  Globin dropped to 7.8 during this admission.  His hemoglobin was 8.9 on discharge.  Hospitalization complicated by hyponatremia and delirium.  Follows outpatient with nephrology. -He does not have any more upcoming surgery scheduled. -He has a seroma on his back without any signs of infection.  Denies any heat, erythema, tenderness, or drainage.  3. History of prostate cancer stage T1c adenocarcinoma of the prostate Gleason score 4+4 and PSA 9.06 Diagnosed in 2020, completed definitive radiotherapy 06/29/2019 - 09/23/2019 Managed by urologist Dr. Tresa Moore and radiation oncologist Dr. Tammi Klippel Last PSA reportedly normal per patient/spouse -Reported that his PSA and history of prostate cancer is under "good control".   Chronic conditions HTN, sleep apnea, hyponatremia Continue PCP follow-up, Jillyn Ledger, FNP and nephrologist for hyponatremia.  Patient is currently on a fluid restriction.    Plan: -Lab today, hemoglobin 9.5 -Encouraged to continue taking ferrous sulfate with vitamin C -Lab only visit in 2 months and lab and follow-up visit in 4 months. -We will call the patient on Monday, 06/16/2022 with results of his pending lab studies and if he needs any additional IV iron.  Thus far, iron panel is normal.     No orders of the defined types were placed in this encounter.    The total time spent in the appointment was 30-39 minutes .  Skyeler Smola L Ozelle Brubacher, PA-C 06/13/22

## 2022-06-13 NOTE — Telephone Encounter (Signed)
I called the patient to review his labs. His iron studies and B12 are normal. His ferritin is high which may be secondary to his recent surgery/inflammation and recent iron infusion. No iron infusion recommended at this time. We will continue to monitor his labs every 2 months and see him back in 4 months. Of course, if he develops any worsening signs or symptoms of anemia, advised to call us sooner for evaluation.

## 2022-07-02 ENCOUNTER — Ambulatory Visit (INDEPENDENT_AMBULATORY_CARE_PROVIDER_SITE_OTHER): Payer: Medicare Other | Admitting: Neurology

## 2022-07-02 ENCOUNTER — Encounter: Payer: Self-pay | Admitting: Neurology

## 2022-07-02 VITALS — BP 160/70 | HR 58 | Ht 67.0 in | Wt 154.0 lb

## 2022-07-02 DIAGNOSIS — Z9989 Dependence on other enabling machines and devices: Secondary | ICD-10-CM

## 2022-07-02 DIAGNOSIS — S069XAS Unspecified intracranial injury with loss of consciousness status unknown, sequela: Secondary | ICD-10-CM

## 2022-07-02 DIAGNOSIS — G4733 Obstructive sleep apnea (adult) (pediatric): Secondary | ICD-10-CM | POA: Diagnosis not present

## 2022-07-02 DIAGNOSIS — F0280 Dementia in other diseases classified elsewhere without behavioral disturbance: Secondary | ICD-10-CM

## 2022-07-02 NOTE — Patient Instructions (Signed)
Increase your socialization - use phone or Face Time and try to do more in person as you can.  Increase your activity. You can do light activity and stretching. Talk to you physical therapist about this. I want you to try and be active for at least I want you to join a senior center. I want you to try and go 1-2 times a week.  I am worried for your mood. I don't want you to become depressed and I worry you may be feeling down already. Please keep me updated on this. We can talk about medication as needed.  Look into the delirium tips below. These are helpful in identifying delirium.  Try melatonin 3-6 mg at night as needed. Take this 30-60 minutes before sleep.   ~Here are some tips for keeping your Brain Healthy: -Regular physical exercise helps keep memory strong. The goal is a total 150 minutes per week of moderate cardio exercise (bike, brisk walking, dancing, etc.). Split this up as is best for you. -Use memory aids such as "to do" lists, sticky notes, calendars, etc. Mark each day off the calendar as it passes. -Eating healthy with mostly a Mediterranean diet and avoiding fried foods can help (see below) -Continue to avoid smoking. -Continue to avoid alcohol, or limit your alcohol intake to one serving a day if you choose to drink. -Maintain regular safe socialization activities as much as possible. Socialization is an excellent stimulus for brain health.  -Regular mental tasks like puzzles, art, music, current events help keep memory strong. Doing word searches, puzzles, card games, [place], [place], [place], Math Problems. Tasks such as learning a new language, using your opposite hand to eat, word searches, etc., can be stimulating for your brain health. -If you go to [facility], you can go into the learning section and buy various booklets or card games to help with learning. Even though these are grade school level, this will be something you haven't thought about it in a long time so it  will help stimulate your brain.  -Maintain a healthy sleep routine. Try to sleep seven to eight hours each night.  -Maintain your health with proper control of your blood pressure, cholesterol, weight, and blood sugar. Make sure to follow with your PCP, eye doctor, and dentist.  -Include your loved ones in your strategies to improve brain health. It can be uncomfortable having issues with your memory but your loved ones will understand and can help you work on these tools.   REASSURE for DELIRIUM  Medical disorders such as infections, constipation, dehydration, pain, sleep deprivation, head trauma, surgery, and adverse reactions to medications, are all potential causes of delirium.   If you feel your loved one is suffering from delirium, it is imperative they be evaluated by a medical professional as early as possible. If you can see your PCP, this would be the best option as they know the baseline. If not, it is important to rule out any other acute causes and you may need to report to urgent care or if severe, to your local ER.  If delirium is not diagnosed or left untreated, an older adult's medical condition can worsen leading to declines in physical health and cognitive functioning, hospitalizations, placement in long term care, and even death. More common in older adults with dementia, delirium can often be mistaken for a worsening of their disease. Individuals with dementia who experience delirium tend to decline quicker. For some, delirium may have long lasting effects with  the potential of never completely recovering to previous cognitive functions.   Recognizing Delirium is an important first step in managing the symptoms. In addition to a sudden or acute onset and fluctuating course, symptoms may include:  Disorganized thinking Excessive sensitivity to light and sounds Levels of consciousness are altered Inattention Rambling speech Increased anxiety, agitation or aggression Use of  medications that increase risk (i.e. sedatives, narcotics, cardiac, anticholinergics, antipsychotics, cold medicines, anti-allergy) Memory changes, confusion or hallucinations  Delirium can be a very frightening and difficult experience for patients, families and caregivers. Preventing or minimizing symptoms of delirium is essential. Once recognized, any acute cause should be ruled out first. Managing the symptoms of delirium with non-drug approaches should begin as soon as possible. The use of medications should be limited, and saved for more severe cases.  Reality orientation - constant reminders of time of day, day of week, month and year, and location Environment - familiar objects, photos, clocks, calendars, appropriate time of day lighting, reduce noise Attention to sensory needs including vision and hearing aids Stimulating cognitive activities and/or therapeutic activities Sufficient liquids for dehydration prevention Use of short, simple sentences to improve communication, repeating as needed Reassurances from familiar caregivers can help restore regular routines including sleep patterns Exercise daily   ~Healthy Heart and Brain Diet -Eating a heart-healthy diet is also good for your brain! One example of a heart-healthy diet is the Mediterranean diet. The Mediterranean diet is rich in fish and poultry instead of red meat, nuts (mostly non-peanuts), vegetables, fruits, olive oil or canola oil (instead of butter), whole grain rice, bread, cereal and pasta, and minimizes salt (relying on other spices to flavor foods), and small amounts of wine. There is evidence that the MIND diet, which is a combination of the Mediterranean diet and the DASH diet, is good for your brain. You can learn more by reading the book: The MIND Diet by Doyne Keel or visiting NotebookDistributors.si 10 foods to eat on the MIND diet Here are the 10 foods the MIND diet encourages: Green, leafy  vegetables: Aim for six or more servings per week. This includes kale, spinach, cooked greens, and salads. All other vegetables: Try to eat another vegetable in addition to the green leafy vegetables at least once per day. It's best to choose non-starchy vegetables because they provide a lot of nutrients for a low number of calories. Berries: Eat berries at least twice per week. Berries such as strawberries, blueberries, raspberries, and blackberries all have antioxidant benefits (5Trusted Source, 6Trusted Source). Nuts: Try to get five or more servings of nuts each week. The creators of the [place] don't specify what kind of nuts to consume, but it is probably best to vary the type of nuts you eat to obtain a variety of nutrients. Olive oil: Use olive oil as your main cooking oil. Check out this article for information about the safety of cooking with olive oil. Whole grains: Aim for at least three servings daily. Choose whole grains like oatmeal, quinoa, brown rice, whole wheat pasta, and 100% whole wheat bread. Fish: Eat fish at least once per week. It is best to choose fatty fish such as salmon, sardines, trout, tuna, and mackerel for their high amounts of omega-3 fatty acids. Beans: Include beans in at least four meals per week. This category includes all beans, lentils, and soybeans. Poultry: Try to eat chicken or [place] at least twice per week. Note that fried chicken is not encouraged on the MIND diet. Wine:  Aim for no more than one glass daily. Both red and white wine may benefit your brain. While there has been much interest in the compound resveratrol, which is found in red wine, recent research has questioned whether it has clear benefits in humans (8Trusted Source). If you're unable to consume the target number of servings, don't quit the MIND diet altogether. Research has shown that following the MIND diet even to a moderate degree is associated with a reduced risk of Alzheimer's disease  and cognitive impairment (9Trusted Source, 10Trusted Source). When you're following the diet, you can eat more than just these 10 foods. However, the more you stick to the diet, the better your results may be. According to research, eating more of the 10 recommended foods and less of the foods that the diet recommends avoiding has been associated with a lower risk of Alzheimer's disease and better brain function over time (9Trusted Source, 10Trusted Source). ~Communication and Behavior for People living with Memory Loss  Communication can be hard for people with Alzheimer's and or other Dementias because they have trouble remembering things. They also can become agitated and anxious, even angry. In some forms of dementia, language abilities are affected such that people have trouble finding the right words or have difficulty speaking. You may feel frustrated or impatient, but it is important to understand that the disease is causing the change in communication skills. To help make communication easier, you can:  - Reassure the person. Speak calmly. Listen to his or her concerns and frustrations.  - Try to show that you understand if the person is angry or fearful. - Allow the person to keep as much control in his or her life as possible. - Respect the person's personal space. - Build quiet times into the day, along with activities. - Keep well-loved objects and photographs around the house to help the person feel more secure. - Remind the person who you are if he or she doesn't remember, but try not to say, "Don't you remember?" - Encourage a two-way conversation for as long as possible. -Try distracting the person with an activity, such as a familiar book or photo album, if you are having trouble communicating with words.  ~Home Safety for People with Memory loss As a caregiver or family member to a person with memory changes, you can take steps to make the home a safer place. Removing hazards and  adding safety features around the home can help give the person more freedom to move around independently and safely. Try these tips:  -Eliminate stimuli that are misleading such as clutter, TV/radio/noise, reflections in mirrors or dark windows, misunderstood pictures or decor -Reduce environmental stress such as too much caffeine, extra people, holiday decorations, etc. -Involve in 1:1 activities or small groups - Safe proof the environment: secure outdoor areas, decor, smaller recreational and dining areas, natural bright light. -If you have stairs, make sure there is at least one handrail. Put carpet or safety grip strips on stairs, or mark the edges of steps with brightly colored tape so they are more visible. -Insert safety plugs into unused electrical outlets and consider safety latches on cabinet doors. -Clear away unused items and remove small rugs, electrical cords, and other items the person may trip over. -Make sure all rooms and outdoor areas the person visits have good lighting. -Remove curtains and rugs with busy patterns that may confuse the person. -Remove or lock up cleaning and household products, such as paint thinner and matches.  To increase activity In Home activities https://memory.http://webb-wilkinson.info/   ~Caregiver stress -Caring for someone with dementia can be difficult. Your family member cares a lot about you and want the best for you. -It is important to take care of yourself, physically and mentally, in order for you to continue caring for your loved one.  -We recommend using The 36-hour Day as a reference for ideas on how to manage/cope with memory disease.  -Caregivers have an increased risk of depression and burnout; Consider joining a support group through [employer], http://forbes-duran.com/ a local program with dementia care expertise that provides care for 19 counties. The offer support groups, an  educational webinar: Caregiver Connections, and one on one consultations. -Nationally there is the Alzheimer's Association 24/7 helpline with any questions at [phone] or visit CapitalMile.co.nz  ~Driving -Driving ability is likely to decline. Please utilize the [facility] website for further information about driving safety and planning. [email or web address]  Thank you for allowing me to be a part of your care team! I look forward to being involved with your ongoing care. We always value feedback. You will receive a survey from [place].  Take care,

## 2022-07-02 NOTE — Progress Notes (Signed)
PATIENT: Cody Gonzalez DOB: [birth year]  REASON FOR VISIT: follow up HISTORY FROM: patient and wife,  both present.   HISTORY OF PRESENT ILLNESS:   07/02/22: This is a RV after back surgery, and he is recovering well. He had delirium after surgery again, had General anesthesia 4 times ! Inpatient PT was completed, now ambulatory PT, ST, OT.less pan, less medication, his sodium has been controlled, after having hyponatremia. Had ONLY one fall in the last 3 months.  Here for memory testing today- is delirium related to dementia?  His sleep study in 02-05-2022 was positive for apnea and controlled by simple CPAP If CPAP titration under an E son nasal mask started at 5 and was advanced to a total 8 cm pressure but he did best at 6 cm pressure only this is a low pressure requirement.  The patient had at that time endorsed the Epworth Sleepiness Scale at 0 points in the fatigue severity score at 11.  With his residual mild apnea he presents today via looking at his home results he is using an a flex CPAP over the last 30 days he has used it every night with an average of 7 hours 37 minutes.  And his peak pressure is 10.7 cm water he is not creating large air leaks.  His average AHI is still 9.4 in contrast to what we saw in the sleep study.  I will ask him to try to avoid supine sleep also with recent back surgeries and a habit of sleeping on his back, this may be difficult.  We have in the past recommended that a tennis ball sewn to a T-shirt so that the patient is notched and will not rest on his back but after his back surgeries I would be hesitant to recommend anything that.  Resistance or pressure on the back and he may do better just by putting a bar of soap under the sheet in the middle of the bed it will avoid supine sleep because you are only getting a comfortable sleep position to the left or right of the bar.    07/02/2022    9:21 AM 01/05/2020    3:14 PM 07/04/2019    1:03 PM   Montreal Cognitive Assessment   Visuospatial/ Executive (0/5) '4 5 5  '$ Naming (0/3) '3 3 3  '$ Attention: Read list of digits (0/2) '2 2 1  '$ Attention: Read list of letters (0/1) '1 1 1  '$ Attention: Serial 7 subtraction starting at 100 (0/3) '1 1 3  '$ Language: Repeat phrase (0/2) '2 2 2  '$ Language : Fluency (0/1) 0 1 1  Abstraction (0/2) '2 2 2  '$ Delayed Recall (0/5) 0 3 1  Orientation (0/6) '4 6 5  '$ Total '19 26 24  '$ Adjusted Score (based on education) 19 26    He managed a trail making test, but has trouble generating words, missed all 5 recall words.     CPAP had allowed a reduction in AHI to 9/h, and he felt good using it. Now , after surgery and complications form surgery he just started back on auto CPAP and his device reports central apneas emerging, His narcotic pain medication are weaned off now and after that we should invite him for a attended sleep study, titration to CPAP to BiPAP or ST.   Postsurgical infection, repeated surgery- There was stenosis a Ll2- L4-5 . He is just now recovering. Vancomycin and Ceftriaxone, ID follow up next Tuesday. [facility] print out brought by spouse;  He was found to be anemic which may explain his other problem:  3 times syncope- once at [facility], one yesterday , was seen in ED, cardiology referral pending .        RM 10 with spouse. Last seen 03/19/21. He had back surgery last 12/02/21,  He had a cyst on his spinal cord, and was affecting gait .  Nerve Compression between L 2 and S1-   Post surgical developed infection, had emergency surgery on 12/15/21. Cyst returned ! , he changed to [facility] spine -  Wearing back brace. Ambulates with walker. Getting IV antibiotics right now. Doing home PT twice weekly [facility]).  Went to [facility] 01/03/22 for syncope. No reason found. Waiting on referral to Cardiology for further work up.  Having increased memory issues, progression of dementia.  [name] overall has been originally a patient of our sleep clinic and he was not  able to use his CPAP for a while with multiple hospitalizations surgical protocols etc.  He just resumed auto CPAP in early March and I have 8 days of data available here the average CPAP pressure was 11.3 cmH2O the 90th percentile pressure was 15 cmH2O and he did have several episodes of periodic breathing at night also known as Cheyne-Stokes respiration. No high air leak noted. Good mask fit. .  The AHI is very high at 25.2 and it indicates that these are central apneas the so-called clean airway apnea index.   He is using a dream station to CPAP and the maximum pressure is set at 15 cm water.  The minimum at 6 cmH2O the average user time was 7 hours 57 minutes since he returned to CPAP use.  I do not like to see that he seems to have clearly central apneas on his download and that CPAP may actually at this time increase his apnea index.  He also may still be on oxycodone.me An narcotic pain medication can increase central apneas usually within 30 minutes of intake time and lasting for up to 3 hours.  So I cannot breakdown at which time during the night the central apneas may peak but I do think that will likely complication of oxycodone.   I do not see any other medications here that would explain his conversion to central apnea and of course that can be nonmedication related reasons as well.  With his wife reports that his cognition is also more challenged and more difficult for him to remember especially short-term memory that may be an underlying neurodegenerative disorder that contributes to the evolution into central apnea.      03-19-2021: Interval history:Pt with wife, rm 61. Following up. He did had neck surgery early March and initially after the surgery was recovering well.  More recently he has started to notice the balance beginning to get off again and right leg is weaker. He has followed with NeuroSurgery fusion-an lumbar and cervical MRI is ordered for tomorrow to further evaluate this  concern. X ray showed normal alignment.   CPAP: He is on auto PAP , residual AHI is 9/h- too high, not related to high air leaks. He is  doing well, no issues or concerns. Sleeps well. DME. [employer] [facility]).       12-12-2020 EMG and NCV showed polyneuropathy, axonal. Not a peripheral injury. CT lumbar spine shows L4-5 severe spinal stenosis, which is below the spinal cord level. Arthropathy.  ONO has not resulted yet. I will send [name] .  to  a neurosurgical consult for the lumbar spinal stenosis.  No pain, just sudden loss of leg strength. His right side is "giving out" no pain.    Patient is vaccinated and had the booster, presenting with improved sleep- continued to use CPAP.  The patient has continued to use his CPAP machine which is on a flex Respironics machine 100% of the time and by day average 8 hours and 5 minutes per night.  He is an excellent highly compliant patient he uses the 90 percentile pressure of 10 cm water.  His average AHI however strikes me as high as 13.4/h his settings are such that that has a minimum setting of 6 a maximum of 15 out of 3 cm flexibility exhalation relief.  Ramp time is 15 minutes started 4 cmH2O.  The humidifier has been set at a low or of function from as I cannot differentiate why [name] would have higher AHI now -I do not see a air leak data or central versus OSA. I offered a home ONO- t based on results I may need [name].to return for an attended titration.  He reports more stiffness, gait impairment, and he is getting slower. He  Is leaning sideways.  PCP evaluated him since last year- 04-19-2020; group bicycle ride let to another fall. He had PT until November. His gait changed after that- more lower back pan- and only some radiation- achiness in the thigh.   I noted the patient asked questions over and over- definitely memory changes.     Mr. Lesniak is a 76 year old male with a history of obstructive sleep apnea on CPAP.  He  returns today for follow-up.  His download indicates that he uses his machine nightly for compliance of 100%.  He uses his machine greater than 4 hours each night.  On average he uses his machine 7 hours and 40 minutes.  His residual AHI is 13.2 on 6 to 10 cm of water with EPR of 3.  He reports that the CPAP is working well for him.  He felt that his memory has remained stable.  He states that he sometimes has issues with immediate recall.  He continues to live at home with his spouse.  Able to complete all ADLs independently.  He returns today for an evaluation.  HISTORY 07/04/19:   Mr. Ortega is a 76 year old male with a history of obstructive sleep apnea on CPAP and memory disturbance.  He returns today for evaluation of his memory.  Overall he feels that his memory is stable.  He states that sometimes he draws a blank but after several minutes he is able to recall what he needs to.  He denies any reports by his family or spouse of memory trouble.  He lives at home with his spouse.  He is able to complete all ADLs independently.  His wife handles the finances.  He reports that she is always done this.  He operates a Teacher, music without difficulty.  He reports that sometimes he has decreased confidence with directions but is always going the right way.  He does help with meals.  Reports that he does bake bread frequently.  He is able to remember recipes.  Overall he feels that he is doing well.  He returns today for an evaluation.  REVIEW OF SYSTEMS: Out of a complete 14 system review of symptoms, the patient complains only of the following symptoms, and all other reviewed systems are negative.  FSS 11 How likely are  you to doze in the following situations: 0 = not likely, 1 = slight chance, 2 = moderate chance, 3 = high chance  Sitting and Reading? Watching Television? Sitting inactive in a public place (theater or meeting)? Lying down in the afternoon when circumstances permit? Sitting  and talking to someone? Sitting quietly after lunch without alcohol? In a car, while stopped for a few minutes in traffic? As a passenger in a car for an hour without a break?  Total = 0    ALLERGIES: Allergies  Allergen Reactions   Nifedipine Swelling   Amlodipine Besylate Swelling and Other (See Comments)    Ankles swell    HOME MEDICATIONS: Outpatient Medications Prior to Visit  Medication Sig Dispense Refill   Acetaminophen (TYLENOL 8 HOUR PO)      alfuzosin (UROXATRAL) 10 MG 24 hr tablet Take 10 mg by mouth at bedtime.     ascorbic acid (VITAMIN C) 1000 MG tablet Take 1,000 mg by mouth daily.     atorvastatin (LIPITOR) 40 MG tablet Take 40 mg by mouth at bedtime.     b complex vitamins capsule Take 1 capsule by mouth daily.     calcitonin, salmon, (MIACALCIN/FORTICAL) 200 UNIT/ACT nasal spray Place into the nose.     carvedilol (COREG) 6.25 MG tablet Take 1 tablet (6.25 mg total) by mouth 2 (two) times daily. 60 tablet 0   Cholecalciferol 50 MCG (2000 UT) CAPS Take 2,000 Units by mouth daily.     docusate sodium (COLACE) 50 MG capsule Take 50 mg by mouth as needed for mild constipation.     ferrous sulfate 325 (65 FE) MG tablet Take 325 mg by mouth daily with breakfast.     Lidocaine 4 % PTCH Apply 2 patches topically daily.     Multiple Vitamins-Iron (MULTIVITAMIN/IRON PO) Take 1 tablet by mouth daily with breakfast.     telmisartan (MICARDIS) 80 MG tablet Take 80 mg by mouth daily.     UNABLE TO FIND Take 15 mg by mouth daily. Med Name: [name]     famotidine (PEPCID) 20 MG tablet Take 20 mg by mouth daily.     oxyCODONE (OXY IR/ROXICODONE) 5 MG immediate release tablet      polyethylene glycol (MIRALAX) 17 g packet 1 packet mixed with 8 ounces of fluid     No facility-administered medications prior to visit.    PAST MEDICAL HISTORY: Past Medical History:  Diagnosis Date   Anemia    Hypertension    Prostate cancer ([place])    Sleep apnea    uses cpap    TBI  (traumatic brain injury) ([place]) 2015    PAST SURGICAL HISTORY: Past Surgical History:  Procedure Laterality Date   BACK SURGERY     BILATERAL CARPAL TUNNEL RELEASE  2014   CYSTOSCOPY N/A 06/29/2019   Procedure: CYSTOSCOPY;  Surgeon: Alexis Frock, MD;  Location: [facility];  Service: Urology;  Laterality: N/A;  No seeds in bladder per Dr. Lilli Light LAMINECTOMY/DECOMPRESSION MICRODISCECTOMY Left 05/28/2021   Procedure: Left  - Lumbar five-Sacral one Laminectomy resection of synovial cyst;  Surgeon: Earnie Larsson, MD;  Location: [facility];  Service: Neurosurgery;  Laterality: Left;   PROSTATE BIOPSY     RADIOACTIVE SEED IMPLANT N/A 06/29/2019   Procedure: RADIOACTIVE SEED IMPLANT/BRACHYTHERAPY IMPLANT;  Surgeon: Alexis Frock, MD;  Location: [facility];  Service: Urology;  Laterality: N/A;  [place] N/A 06/29/2019   Procedure: SPACE  OAR INSTILLATION;  Surgeon: Alexis Frock, MD;  Location: [facility];  Service: Urology;  Laterality: N/A;    FAMILY HISTORY: Family History  Problem Relation Age of Onset   Brain cancer Father        GBM    SOCIAL HISTORY: Social History   Socioeconomic History   Marital status: Married    Spouse name: Not on file   Number of children: 1   Years of education: Not on file   Highest education level: Not on file  Occupational History   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: retired  Tobacco Use   Smoking status: Never   Smokeless tobacco: Never  Scientific laboratory technician Use: Never used  Substance and Sexual Activity   Alcohol use: Not Currently    Alcohol/week: 2.0 standard drinks of alcohol    Types: 2 Cans of beer per week    Comment: 2 beers daily   Drug use: No   Sexual activity: Yes    Comment: with aid of sildenafil  Other Topics Concern   Not on file  Social History Narrative   ** Merged History Encounter **       Married. 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 Preferred name: Rod. Enjoys cycling.   Social Determinants of Health   Financial Resource Strain: Not on file  Food Insecurity: Not on file  Transportation Needs: Not on file  Physical Activity: Not on file  Stress: Not on file  Social Connections: Not on file  Intimate Partner Violence: Not on file      PHYSICAL EXAM  Vitals:   07/02/22 0911  BP: (!) 160/70  Pulse: (!) 58  Weight: 154 lb (69.9 kg)  Height: '5\' 7"'$  (1.702 m)   Body mass index is 24.12 kg/m. Generalized: Well developed, in no acute distress  Chest: Lungs clear to auscultation bilaterally. Facial lesions form basalioma removal, one on he nose _ CPAP mask interferes. And one high left parietal scalp.  Neurological examination  Mentation: Alert oriented to time, place, history taking. Follows all commands speech and language fluent.    07/02/2022    9:21 AM 01/05/2020    3:14 PM 07/04/2019    1:03 PM  Montreal Cognitive Assessment   Visuospatial/ Executive (0/5) '4 5 5  '$ Naming (0/3) '3 3 3  '$ Attention: Read list of digits (0/2) '2 2 1  '$ Attention: Read list of letters (0/1) '1 1 1  '$ Attention: Serial 7 subtraction starting at 100 (0/3) '1 1 3  '$ Language: Repeat phrase (0/2) '2 2 2  '$ Language : Fluency (0/1) 0 1 1  Abstraction (0/2) '2 2 2  '$ Delayed Recall (0/5) 0 3 1  Orientation (0/6) '4 6 5  '$ Total '19 26 24  '$ Adjusted Score (based on education) 19 26         No data to display          Cranial nerve : no loss of smell or taste, Extraocular movements were full, visual field were full on confrontational test Head turning and shoulder shrug were with normal ROM, symmetric. He has brisk hyperreflexia.   Sensory: inatct to vibration and fine touch in hands and feet.   Motor : good hip flexion and knee extension, no pain radiating down his leg, brisk patella DTR bilaterally.  Gait and station: he uses a walker, wears a brace.  Gait is stooped posture, his stride is shorter, he  drifts to  the right. Right sided foot drop since 2015.    DIAGNOSTIC DATA (LABS, IMAGING, TESTING) - I reviewed patient records, labs, notes, testing and imaging myself where available. CPAP download.   I have reviewed his [facility] records, [facility], nephrologist , osteoporosis clinic.   ASSESSMENT AND PLAN 76 y.o. year old male  has a past medical history of Anemia, Hypertension, Prostate cancer ([place]), Sleep apnea, and TBI (traumatic brain injury) ([place]) (2015). Postoperative infection, central apnea emerging under CPAP.     I spent 32 minutes of face-to-face and non-face-to-face time with patient and spouse . I reviewed the records from neurosurgery. From [facility].    This included previsit chart review, lab review, study review, order entry, electronic health record documentation, patient education.  Larey Seat, MD   07/02/2022, 10:11 AM [facility] [address], [place], [place] [address] [phone]

## 2022-07-06 ENCOUNTER — Other Ambulatory Visit: Payer: Self-pay | Admitting: Cardiology

## 2022-07-06 DIAGNOSIS — I951 Orthostatic hypotension: Secondary | ICD-10-CM

## 2022-07-08 ENCOUNTER — Telehealth: Payer: Self-pay

## 2022-07-08 NOTE — Telephone Encounter (Signed)
Recently changed patient's beta blocker to carvedilol. BP doing well with change. Will continue to monitor.  Average Systolic BP Level 646.80 mmHg Lowest Systolic BP Level 321 mmHg Highest Systolic BP Level 224 mmHg

## 2022-07-11 NOTE — Telephone Encounter (Signed)
Continue current medications.  Thanks for the update.   Dr. Terri Skains

## 2022-07-28 ENCOUNTER — Ambulatory Visit: Payer: Medicare Other | Attending: Nurse Practitioner

## 2022-07-28 ENCOUNTER — Other Ambulatory Visit: Payer: Self-pay

## 2022-07-28 DIAGNOSIS — R41841 Cognitive communication deficit: Secondary | ICD-10-CM | POA: Insufficient documentation

## 2022-07-28 DIAGNOSIS — R4701 Aphasia: Secondary | ICD-10-CM | POA: Insufficient documentation
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
## 2022-08-05 ENCOUNTER — Ambulatory Visit: Payer: Medicare Other | Attending: Nurse Practitioner

## 2022-08-05 DIAGNOSIS — R4701 Aphasia: Secondary | ICD-10-CM | POA: Insufficient documentation

## 2022-08-05 DIAGNOSIS — R41841 Cognitive communication deficit: Secondary | ICD-10-CM | POA: Insufficient documentation
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
## 2022-08-08 ENCOUNTER — Ambulatory Visit (INDEPENDENT_AMBULATORY_CARE_PROVIDER_SITE_OTHER): Payer: Medicare Other | Admitting: Podiatry

## 2022-08-08 ENCOUNTER — Other Ambulatory Visit (HOSPITAL_BASED_OUTPATIENT_CLINIC_OR_DEPARTMENT_OTHER): Payer: Self-pay

## 2022-08-08 ENCOUNTER — Encounter: Payer: Self-pay | Admitting: Podiatry

## 2022-08-08 DIAGNOSIS — B351 Tinea unguium: Secondary | ICD-10-CM | POA: Diagnosis not present

## 2022-08-08 DIAGNOSIS — M79675 Pain in left toe(s): Secondary | ICD-10-CM | POA: Diagnosis not present

## 2022-08-08 DIAGNOSIS — M79674 Pain in right toe(s): Secondary | ICD-10-CM

## 2022-08-08 MED ORDER — INFLUENZA VAC A&B SA ADJ QUAD 0.5 ML IM PRSY
PREFILLED_SYRINGE | INTRAMUSCULAR | 0 refills | Status: DC
  Filled 2022-08-08: qty 0.5, 1d supply, fill #0

## 2022-08-08 MED ORDER — AREXVY 120 MCG/0.5ML IM SUSR
INTRAMUSCULAR | 0 refills | Status: DC
  Filled 2022-08-08: qty 0.5, 1d supply, fill #0

## 2022-08-08 NOTE — Progress Notes (Signed)
This patient returns to the office for evaluation and treatment of long thick painful nails .  This patient is unable to trim his own nails since the patient cannot reach his feet.  Patient says the nails are painful walking and wearing his shoes.  He returns for preventive foot care services.  General Appearance  Alert, conversant and in no acute stress.  Vascular  Dorsalis pedis and posterior tibial  pulses are palpable  bilaterally.  Capillary return is within normal limits  bilaterally. Temperature is within normal limits  bilaterally.  Neurologic  Senn-Weinstein monofilament wire test within normal limits  bilaterally. Muscle power within normal limits bilaterally.  Nails Thick disfigured discolored nails with subungual debris hallux nails   bilaterally. No evidence of bacterial infection or drainage bilaterally.  Orthopedic  No limitations of motion  feet .  No crepitus or effusions noted.  No bony pathology or digital deformities noted.  Skin  normotropic skin with no porokeratosis noted bilaterally.  No signs of infections or ulcers noted.     Onychomycosis  Pain in toes right foot  Pain in toes left foot  Debridement  of nails  1-5  B/L with a nail nipper.  Nails were then filed using a dremel tool with no incidents.    RTC  3 months   Leanora Murin DPM    

## 2022-08-10 ENCOUNTER — Other Ambulatory Visit: Payer: Self-pay | Admitting: Cardiology

## 2022-08-10 DIAGNOSIS — I951 Orthostatic hypotension: Secondary | ICD-10-CM

## 2022-08-11 MED ORDER — CARVEDILOL 6.25 MG PO TABS: 6.2500 mg | ORAL_TABLET | Freq: Two times a day (BID) | ORAL | 0 refills | Status: DC

## 2022-08-13 ENCOUNTER — Ambulatory Visit: Payer: Medicare Other

## 2022-08-13 ENCOUNTER — Inpatient Hospital Stay: Payer: Medicare Other | Attending: Physician Assistant

## 2022-08-13 ENCOUNTER — Other Ambulatory Visit: Payer: Self-pay

## 2022-08-13 DIAGNOSIS — R41841 Cognitive communication deficit: Secondary | ICD-10-CM

## 2022-08-13 DIAGNOSIS — D649 Anemia, unspecified: Secondary | ICD-10-CM | POA: Diagnosis not present

## 2022-08-13 DIAGNOSIS — R4701 Aphasia: Secondary | ICD-10-CM

## 2022-08-13 LAB — CMP (CANCER CENTER ONLY)
ALT: 18 U/L (ref 0–44)
AST: 18 U/L (ref 15–41)
Albumin: 4.2 g/dL (ref 3.5–5.0)
Alkaline Phosphatase: 109 U/L (ref 38–126)
Anion gap: 5 (ref 5–15)
BUN: 31 mg/dL — ABNORMAL HIGH (ref 8–23)
CO2: 28 mmol/L (ref 22–32)
Calcium: 8.7 mg/dL — ABNORMAL LOW (ref 8.9–10.3)
Chloride: 95 mmol/L — ABNORMAL LOW (ref 98–111)
Creatinine: 0.64 mg/dL (ref 0.61–1.24)
GFR, Estimated: >60 mL/min (ref >60)
Glucose, Bld: 95 mg/dL (ref 70–99)
Potassium: 4 mmol/L (ref 3.5–5.1)
Sodium: 128 mmol/L — ABNORMAL LOW (ref 135–145)
Total Bilirubin: 1.1 mg/dL (ref 0.3–1.2)
Total Protein: 6.8 g/dL (ref 6.5–8.1)

## 2022-08-13 LAB — FERRITIN: Ferritin: 190 ng/mL (ref 24–336)

## 2022-08-13 LAB — CBC WITH DIFFERENTIAL (CANCER CENTER ONLY)
Abs Immature Granulocytes: 0.01 10*3/uL (ref 0.00–0.07)
Basophils Absolute: 0 10*3/uL (ref 0.0–0.1)
Basophils Relative: 0 %
Eosinophils Absolute: 0.1 10*3/uL (ref 0.0–0.5)
Eosinophils Relative: 4 %
HCT: 34.7 % — ABNORMAL LOW (ref 39.0–52.0)
Hemoglobin: 12.1 g/dL — ABNORMAL LOW (ref 13.0–17.0)
Immature Granulocytes: 0 %
Lymphocytes Relative: 10 %
Lymphs Abs: 0.3 10*3/uL — ABNORMAL LOW (ref 0.7–4.0)
MCH: 33.1 pg (ref 26.0–34.0)
MCHC: 34.9 g/dL (ref 30.0–36.0)
MCV: 94.8 fL (ref 80.0–100.0)
Monocytes Absolute: 0.4 10*3/uL (ref 0.1–1.0)
Monocytes Relative: 12 %
Neutro Abs: 2.5 10*3/uL (ref 1.7–7.7)
Neutrophils Relative %: 74 %
Platelet Count: 211 10*3/uL (ref 150–400)
RBC: 3.66 MIL/uL — ABNORMAL LOW (ref 4.22–5.81)
RDW: 13.1 % (ref 11.5–15.5)
WBC Count: 3.4 10*3/uL — ABNORMAL LOW (ref 4.0–10.5)
nRBC: 0 % (ref 0.0–0.2)

## 2022-08-13 LAB — SAMPLE TO BLOOD BANK

## 2022-08-13 LAB — FOLATE: Folate: 32.4 ng/mL (ref >5.9)

## 2022-08-13 LAB — VITAMIN B12: Vitamin B-12: 527 pg/mL (ref 180–914)
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
## 2022-08-15 ENCOUNTER — Ambulatory Visit: Payer: Medicare Other

## 2022-08-15 DIAGNOSIS — R41841 Cognitive communication deficit: Secondary | ICD-10-CM | POA: Diagnosis not present

## 2022-08-15 DIAGNOSIS — R4701 Aphasia: Secondary | ICD-10-CM
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
## 2022-08-18 ENCOUNTER — Ambulatory Visit: Payer: Medicare Other

## 2022-08-18 DIAGNOSIS — R4701 Aphasia: Secondary | ICD-10-CM

## 2022-08-18 DIAGNOSIS — R41841 Cognitive communication deficit: Secondary | ICD-10-CM | POA: Diagnosis not present
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
## 2022-08-20 ENCOUNTER — Ambulatory Visit: Payer: Medicare Other

## 2022-08-20 ENCOUNTER — Encounter: Payer: Self-pay | Admitting: Cardiology

## 2022-08-20 ENCOUNTER — Ambulatory Visit: Payer: Medicare Other | Admitting: Cardiology

## 2022-08-20 VITALS — BP 152/67 | HR 59 | Temp 97.4°F | Resp 16 | Ht 67.0 in | Wt 159.0 lb

## 2022-08-20 DIAGNOSIS — E871 Hypo-osmolality and hyponatremia: Secondary | ICD-10-CM

## 2022-08-20 DIAGNOSIS — R4701 Aphasia: Secondary | ICD-10-CM

## 2022-08-20 DIAGNOSIS — I10 Essential (primary) hypertension: Secondary | ICD-10-CM

## 2022-08-20 DIAGNOSIS — R41841 Cognitive communication deficit: Secondary | ICD-10-CM | POA: Diagnosis not present

## 2022-08-20 DIAGNOSIS — I951 Orthostatic hypotension: Secondary | ICD-10-CM

## 2022-08-20 DIAGNOSIS — E782 Mixed hyperlipidemia: Secondary | ICD-10-CM

## 2022-08-20 NOTE — Progress Notes (Signed)
Date:  08/20/2022   ID:  Cody Gonzalez, DOB May 21, 1946, MRN [id number]  PCP:  [name], FNP  Cardiologist:  Rex Kras, DO, [facility] (established care 01/20/2022)  Date: 08/20/22 Last Office Visit: 02/18/2022  Chief Complaint  Patient presents with   Hypertension   Follow-up    HPI  Cody Gonzalez is a 76 y.o. Caucasian male whose past medical history and cardiovascular risk factors include: Benign essential hypertension, mixed hyperlipidemia, gastritis/iron deficiency anemia, prostate cancer, chronic hyponatremia, advanced age.  Patient was referred to the practice for evaluation of orthostatic hypotension.  Since then his medications have been changed to minimize vasodilation, lower extremity swelling, and at times he used to use compression stockings as well.  He presents today for 36-monthfollow-up visit.  Over the last 6 months patient has done well from a cardiovascular standpoint.  He denies anginal discomfort or heart failure symptoms.  He is currently enrolled into remote patient monitoring for blood pressure.  His average systolic is 1275mmHg, DBP averages 79 mmHg, and pulse averages 58 bpm.  No hospitalizations or urgent care visits for cardiovascular reasons since last office encounter.  Of note, patient is accompanied by his wife (Wenda Low at today's office visit also provides collateral history.  FUNCTIONAL STATUS: Overall functional status is limited due to him ambulating with a walker and recent surgeries as described above.  ALLERGIES: Allergies  Allergen Reactions   Nifedipine Swelling   Amlodipine Besylate Swelling and Other (See Comments)    Ankles swell    MEDICATION LIST PRIOR TO VISIT: Current Meds  Medication Sig   Acetaminophen (TYLENOL 8 HOUR PO)    alfuzosin (UROXATRAL) 10 MG 24 hr tablet Take 10 mg by mouth at bedtime.   ascorbic acid (VITAMIN C) 1000 MG tablet Take 1,000 mg by mouth daily.   atorvastatin (LIPITOR) 40 MG  tablet Take 40 mg by mouth at bedtime.   b complex vitamins capsule Take 1 capsule by mouth daily.   calcium carbonate (OSCAL) 1500 (600 Ca) MG TABS tablet 2 tablets once daily   carvedilol (COREG) 6.25 MG tablet Take 1 tablet (6.25 mg total) by mouth 2 (two) times daily.   Cholecalciferol 50 MCG (2000 UT) CAPS Take 2,000 Units by mouth daily.   docusate sodium (COLACE) 50 MG capsule Take 50 mg by mouth as needed for mild constipation.   ferrous sulfate 325 (65 FE) MG tablet Take 325 mg by mouth daily with breakfast.   Multiple Vitamins-Iron (MULTIVITAMIN/IRON PO) Take 1 tablet by mouth daily with breakfast.   Romosozumab-aqqg (EVENITY [place]) Inject 1 mL into the skin every 30 (thirty) days.   telmisartan (MICARDIS) 80 MG tablet Take 80 mg by mouth daily.   UNABLE TO FIND Take 15 mg by mouth daily. Med Name: UDorothea Glassman    PAST MEDICAL HISTORY: Past Medical History:  Diagnosis Date   Anemia    Hypertension    [place]    Sleep apnea    uses cpap    T[place] ([place] brain [place]) [place]    PAST SURGICAL HISTORY: Past Surgical History:  Procedure Laterality Date   BACK SURGERY     BASAL CELL CARCINOMA EXCISION     BILATERAL CARPAL TUNNEL RELEASE  2014   CYSTOSCOPY N/A 06/29/2019   Procedure: CYSTOSCOPY;  Surgeon: MAlexis Frock MD;  [facility]  Service: Urology;  Laterality: N/A;  No seeds in bladder per Dr. MLilli LightLAMINECTOMY/DECOMPRESSION MICRODISCECTOMY Left  05/28/2021   Procedure: Left  - Lumbar five-Sacral one Laminectomy resection of synovial cyst;  Surgeon: Earnie Larsson, MD;  Location: [place];  Service: Neurosurgery;  Laterality: Left;   PROSTATE BIOPSY     RADIOACTIVE SEED IMPLANT N/A 06/29/2019   Procedure: RADIOACTIVE SEED IMPLANT/BRACHYTHERAPY IMPLANT;  Surgeon: Alexis Frock, MD;  Location: [facility];  Service: Urology;  Laterality: N/A;  [place] N/A 06/29/2019   Procedure: SPACE  OAR INSTILLATION;  Surgeon: Alexis Frock, MD;  Location: [facility];  Service: Urology;  Laterality: N/A;    FAMILY HISTORY: The patient family history includes Brain cancer in his father.  SOCIAL HISTORY:  The patient  reports that he has never smoked. He has never used smokeless tobacco. He reports that he does not currently use alcohol after a past usage of about 1.0 - 2.0 standard drink of alcohol per week. He reports that he does not use drugs.  REVIEW OF SYSTEMS: Review of Systems  Cardiovascular:  Negative for chest pain, claudication, dyspnea on exertion, irregular heartbeat, leg swelling, near-syncope, orthopnea, palpitations, paroxysmal nocturnal dyspnea and syncope.  Respiratory:  Negative for shortness of breath.   Hematologic/Lymphatic: Negative for bleeding problem.  Musculoskeletal:  Negative for muscle cramps and myalgias.  Neurological:  Negative for dizziness and light-headedness.    PHYSICAL EXAM:    08/20/2022    9:55 AM 07/02/2022    9:11 AM 06/13/2022    9:54 AM  Vitals with BMI  Height '5\' 7"'$  '5\' 7"'$    Weight 159 lbs 154 lbs 153 lbs 11 oz  BMI 41.3 24.40   Systolic 102 725 366  Diastolic 67 70 55  Pulse 59 58 59   CONSTITUTIONAL: Age appropriate, hemodynamically stable, well-nourished. No acute distress. SKIN: Skin is warm and dry. No rash noted. No cyanosis. No pallor. No jaundice HEAD: Normocephalic and atraumatic. Bandage on nose and left side of face due to recent surgery.  EYES: No scleral icterus MOUTH/THROAT: Moist oral membranes.  NECK: No JVD present. No thyromegaly noted. No carotid bruits  CHEST Normal respiratory effort. No intercostal retractions. Back brace.  LUNGS: Clear to auscultation bilaterally.  No stridor. No wheezes. No rales.  CARDIOVASCULAR: Regular rate and rhythm, positive S1-S2, no murmurs rubs or gallops appreciated. ABDOMINAL: Soft, nontender, nondistended, positive bowel sounds in all 4 quadrants, no apparent  ascites.  EXTREMITIES: Warm to touch, no edema. HEMATOLOGIC: No significant bruising NEUROLOGIC: Oriented to person, place, and time. Nonfocal. Normal muscle tone.  PSYCHIATRIC: Normal mood and affect. Normal behavior. Cooperative  CARDIAC DATABASE: EKG: 08/20/2022: Sinus bradycardia, 57 bpm, without underlying ischemia or injury pattern.  Echocardiogram: 01/27/2022:   1. Left ventricular ejection fraction, by estimation, is 60 to 65%. The left ventricle has normal function. The left ventricle has no regional wall motion abnormalities. Left ventricular diastolic parameters were normal. The average left ventricular  global longitudinal strain is -22.9 %. The global longitudinal strain is normal.   2. Right ventricular systolic function is normal. The right ventricular size is normal.   3. Left atrial size was moderately dilated.   4. The mitral valve is grossly normal. Mild mitral valve regurgitation.  No evidence of mitral stenosis.   5. The aortic valve is grossly normal. Aortic valve regurgitation is trivial. Aortic valve sclerosis is present, with no evidence of aortic valve stenosis.   6. The inferior vena cava is normal in size with greater than 50% respiratory variability, suggesting right atrial pressure  of 3 mmHg.    Stress Testing: No results found for this or any previous visit from the past 1095 days.  Heart Catheterization: None  Lower extremity venous duplex bilaterally 02/19/2022: RIGHT:  There is no evidence of deep vein thrombosis in the lower extremity. No cystic structure found in the popliteal fossa.   LEFT: There is no evidence of deep vein thrombosis in the lower extremity. No cystic structure found in the popliteal fossa.   LABORATORY DATA:    Latest Ref Rng & Units 08/13/2022    9:48 AM 06/13/2022    9:41 AM 04/05/2022    5:27 PM  CBC  WBC 4.0 - 10.5 K/uL 3.4  2.7  6.8   Hemoglobin 13.0 - 17.0 g/dL 12.1  9.5  10.1   Hematocrit 39.0 - 52.0 % 34.7  27.5  29.4    Platelets 150 - 400 K/uL 211  217  305        Latest Ref Rng & Units 08/13/2022    9:48 AM 06/13/2022    9:41 AM 04/05/2022    5:27 PM  CMP  Glucose 70 - 99 mg/dL 95  103  119   BUN 8 - 23 mg/dL 31  38  12   Creatinine 0.61 - 1.24 mg/dL 0.64  0.60  0.63   Sodium 135 - 145 mmol/L 128  127  122   Potassium 3.5 - 5.1 mmol/L 4.0  4.3  4.0   Chloride 98 - 111 mmol/L 95  95  87   CO2 22 - 32 mmol/L '28  26  26   '$ Calcium 8.9 - 10.3 mg/dL 8.7  8.6  8.6   Total Protein 6.5 - 8.1 g/dL 6.8  6.5    Total Bilirubin 0.3 - 1.2 mg/dL 1.1  0.8    Alkaline Phos 38 - 126 U/L 109  138    AST 15 - 41 U/L 18  14    ALT 0 - 44 U/L 18  12      Lipid Panel  No results found for: "CHOL", "TRIG", "HDL", "CHOLHDL", "VLDL", "LDLCALC", "LDLDIRECT", "LABVLDL"  No components found for: "NTPROBNP" No results for input(s): "PROBNP" in the last 8760 hours. No results for input(s): "TSH" in the last 8760 hours.  BMP Recent Labs    04/05/22 1727 06/13/22 0941 08/13/22 0948  NA 122* 127* 128*  K 4.0 4.3 4.0  CL 87* 95* 95*  CO2 '26 26 28  '$ GLUCOSE 119* 103* 95  BUN 12 38* 31*  CREATININE 0.63 0.60* 0.64  CALCIUM 8.6* 8.6* 8.7*  GFRNONAA >60 >60 >60    HEMOGLOBIN A1C No results found for: "HGBA1C", "MPG"  Comp Metabolic Panel   3785-88-50    Glucose 91   70-99  BUN 17   6-26  Creatinine 0.73   0.60-1.30  eGFR2021 94   >60  Sodium 127   136-145  Potassium 4.9   3.5-5.5  Chloride 91   98-107  CO2 30   22-32  Anion Gap 10.7   6.0-20.0  Calcium 9.8   8.6-10.3  CA-corrected 9.08   8.60-10.30  Protein, Total 7.2   6.0-8.3  Albumin 4.7   3.4-4.8  TBIL 0.9   0.3-1.0  ALP 73   38-126  AST 22   0-39  ALT 18   0-52  Lipid Panel w/reflex   2021-09-03    Cholesterol 155   <200  CHOL/HDL 2.2   2.0-4.0  HDLD 72   30-70  Triglyceride 113  0-199  NHDL 83   0-129  LDL Chol Calc (NIH) 63   0-99  LDL Chol Calc (NIH) 63   0-99  Colon/EGD   2021-08-05    CBC with Diff   2021-02-28    WBC 4.4    4.0-11.0  RBC 3.64   4.20-5.80  HGB 12.4   13.0-17.0  HCT 35.1   39.0-52.0  MCV 96.4   80.0-94.0  MCH 34.2   27.0-33.0  MCHC 35.4   32.0-36.0  RDW 13.3   11.5-15.5  PLT 208      IMPRESSION:    ICD-10-CM   1. Orthostatic hypotension  I95.1 EKG 12-Lead    2. Benign hypertension  I10     3. Mixed hyperlipidemia  E78.2     4. Chronic hyponatremia  E87.1        RECOMMENDATIONS: Cody Gonzalez is a 76 y.o. Caucasian male whose past medical history and cardiac risk factors include: Benign essential hypertension, mixed hyperlipidemia, gastritis/iron deficiency anemia, prostate cancer, chronic hyponatremia, advanced age.   Initially referred to the practice for orthostatic hypotension.  His medications were changed, recommended bilateral compression stockings, and was enrolled into ambulatory blood pressure monitoring.  His blood pressure medications have been changed accordingly and his average blood pressures at home are 136/79 with a pulse of 58.  He is no longer in a back brace.  His lower extremity swelling is completely resolved.  He is walking the park 0.9 miles couple times a week.  Keep himself busy with daily chores.  Patient denies any anginal discomfort at rest or with effort related activities.  Last echocardiogram results.  Since the patient is not having any anginal discomfort or heart failure symptoms we will hold off on stress testing at this time as clinically it is not needed.  Both patient and wife are agreeable with the plan of care.  Patient is currently on atorvastatin for his underlying hyperlipidemia which is being managed by PCP.  Most recent labs from August 13, 2022 independently reviewed as part of today's encounter.  From a cardiovascular standpoint he is stable and would recommend 1 year in office visit or sooner if needed.  Both patient and wife are thankful for the care provided as he has improved dramatically since his first  encounter.  FINAL MEDICATION LIST END OF ENCOUNTER: No orders of the defined types were placed in this encounter.   Medications Discontinued During This Encounter  Medication Reason   calcitonin, salmon, (MIACALCIN/FORTICAL) 200 UNIT/ACT nasal spray    influenza vaccine adjuvanted (FLUAD) 0.5 ML injection    Lidocaine 4 % PTCH    RSV vaccine recomb adjuvanted (AREXVY) 120 MCG/0.5ML injection      Current Outpatient Medications:    Acetaminophen (TYLENOL 8 HOUR PO), , Disp: , Rfl:    alfuzosin (UROXATRAL) 10 MG 24 hr tablet, Take 10 mg by mouth at bedtime., Disp: , Rfl:    ascorbic acid (VITAMIN C) 1000 MG tablet, Take 1,000 mg by mouth daily., Disp: , Rfl:    atorvastatin (LIPITOR) 40 MG tablet, Take 40 mg by mouth at bedtime., Disp: , Rfl:    b complex vitamins capsule, Take 1 capsule by mouth daily., Disp: , Rfl:    calcium carbonate (OSCAL) 1500 (600 Ca) MG TABS tablet, 2 tablets once daily, Disp: , Rfl:    carvedilol (COREG) 6.25 MG tablet, Take 1 tablet (6.25 mg total) by mouth 2 (two) times daily., Disp: 60 tablet, Rfl: 0   Cholecalciferol  50 MCG (2000 UT) CAPS, Take 2,000 Units by mouth daily., Disp: , Rfl:    docusate sodium (COLACE) 50 MG capsule, Take 50 mg by mouth as needed for mild constipation., Disp: , Rfl:    ferrous sulfate 325 (65 FE) MG tablet, Take 325 mg by mouth daily with breakfast., Disp: , Rfl:    Multiple Vitamins-Iron (MULTIVITAMIN/IRON PO), Take 1 tablet by mouth daily with breakfast., Disp: , Rfl:    Romosozumab-aqqg (EVENITY [place]), Inject 1 mL into the skin every 30 (thirty) days., Disp: , Rfl:    telmisartan (MICARDIS) 80 MG tablet, Take 80 mg by mouth daily., Disp: , Rfl:    UNABLE TO FIND, Take 15 mg by mouth daily. Med Name: [name], Disp: , Rfl:   Orders Placed This Encounter  Procedures   EKG 12-Lead   There are no Patient Instructions on file for this visit.   --Continue cardiac medications as reconciled in final medication list. --Return in  about 1 year (around 08/21/2023) for Annual follow up visit. Or sooner if needed. --Continue follow-up with your primary care physician regarding the management of your other chronic comorbid conditions.  Patient's questions and concerns were addressed to his satisfaction. He voices understanding of the instructions provided during this encounter.   This note was created using a voice recognition software as a result there may be grammatical errors inadvertently enclosed that do not reflect the nature of this encounter. Every attempt is made to correct such errors.  Rex Kras, [place], [facility]  Pager: [phone] Office: [phone]
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
## 2022-08-25 ENCOUNTER — Ambulatory Visit: Payer: Medicare Other

## 2022-08-25 DIAGNOSIS — R4701 Aphasia: Secondary | ICD-10-CM

## 2022-08-25 DIAGNOSIS — R41841 Cognitive communication deficit: Secondary | ICD-10-CM
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
## 2022-08-27 ENCOUNTER — Ambulatory Visit: Payer: Medicare Other

## 2022-08-27 DIAGNOSIS — R41841 Cognitive communication deficit: Secondary | ICD-10-CM | POA: Diagnosis not present

## 2022-08-27 DIAGNOSIS — R4701 Aphasia: Secondary | ICD-10-CM
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
## 2022-09-04 ENCOUNTER — Ambulatory Visit: Payer: Medicare Other | Attending: Nurse Practitioner

## 2022-09-04 DIAGNOSIS — R41841 Cognitive communication deficit: Secondary | ICD-10-CM | POA: Insufficient documentation

## 2022-09-04 DIAGNOSIS — R4701 Aphasia: Secondary | ICD-10-CM | POA: Diagnosis present
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
## 2022-09-06 ENCOUNTER — Other Ambulatory Visit: Payer: Self-pay | Admitting: Cardiology

## 2022-09-06 DIAGNOSIS — I951 Orthostatic hypotension: Secondary | ICD-10-CM

## 2022-09-08 ENCOUNTER — Ambulatory Visit: Payer: Medicare Other

## 2022-09-08 DIAGNOSIS — R41841 Cognitive communication deficit: Secondary | ICD-10-CM

## 2022-09-08 DIAGNOSIS — R4701 Aphasia: Secondary | ICD-10-CM

## 2022-09-08 MED ORDER — CARVEDILOL 6.25 MG PO TABS: 6.2500 mg | ORAL_TABLET | Freq: Two times a day (BID) | ORAL | 0 refills | Status: DC
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
## 2022-09-10 ENCOUNTER — Ambulatory Visit: Payer: Medicare Other

## 2022-09-15 ENCOUNTER — Ambulatory Visit: Payer: Medicare Other

## 2022-09-15 DIAGNOSIS — R41841 Cognitive communication deficit: Secondary | ICD-10-CM

## 2022-09-15 DIAGNOSIS — R4701 Aphasia: Secondary | ICD-10-CM
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
## 2022-09-22 ENCOUNTER — Ambulatory Visit: Payer: Medicare Other

## 2022-09-22 DIAGNOSIS — R41841 Cognitive communication deficit: Secondary | ICD-10-CM | POA: Diagnosis not present

## 2022-09-22 DIAGNOSIS — R4701 Aphasia: Secondary | ICD-10-CM

## 2022-10-02 ENCOUNTER — Ambulatory Visit: Payer: Medicare Other

## 2022-10-02 DIAGNOSIS — R41841 Cognitive communication deficit: Secondary | ICD-10-CM | POA: Diagnosis not present

## 2022-10-02 DIAGNOSIS — R4701 Aphasia: Secondary | ICD-10-CM
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
## 2022-10-06 ENCOUNTER — Ambulatory Visit: Payer: Medicare Other | Attending: Nurse Practitioner

## 2022-10-06 DIAGNOSIS — R4701 Aphasia: Secondary | ICD-10-CM | POA: Insufficient documentation

## 2022-10-06 DIAGNOSIS — R41841 Cognitive communication deficit: Secondary | ICD-10-CM | POA: Diagnosis present
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
## 2022-10-08 ENCOUNTER — Other Ambulatory Visit: Payer: Self-pay | Admitting: Cardiology

## 2022-10-08 DIAGNOSIS — I951 Orthostatic hypotension: Secondary | ICD-10-CM

## 2022-10-08 MED ORDER — CARVEDILOL 6.25 MG PO TABS: 6.2500 mg | ORAL_TABLET | Freq: Two times a day (BID) | ORAL | 3 refills | Status: DC

## 2022-10-12 NOTE — Assessment & Plan Note (Signed)
stage T1c adenocarcinoma of the prostate Gleason score 4+4 and PSA 9.06 Diagnosed in 2020, completed definitive radiotherapy 06/29/2019 - 09/23/2019 Managed by urologist Dr. Tresa Moore and radiation oncologist Dr. Tammi Klippel Last PSA reportedly normal per patient/spouse

## 2022-10-12 NOTE — Assessment & Plan Note (Signed)
-  he developed severe anemia after spinal surgery and abscess  -lab was negative for nutritional anemia -improved overall

## 2022-10-13 ENCOUNTER — Inpatient Hospital Stay: Payer: Medicare Other | Attending: Physician Assistant | Admitting: Hematology

## 2022-10-13 ENCOUNTER — Inpatient Hospital Stay: Payer: Medicare Other

## 2022-10-13 ENCOUNTER — Encounter: Payer: Self-pay | Admitting: Hematology

## 2022-10-13 VITALS — BP 160/62 | HR 60 | Temp 98.0°F | Resp 18 | Ht 67.0 in | Wt 158.6 lb

## 2022-10-13 DIAGNOSIS — D649 Anemia, unspecified: Secondary | ICD-10-CM | POA: Diagnosis present

## 2022-10-13 DIAGNOSIS — Z8546 Personal history of malignant neoplasm of prostate: Secondary | ICD-10-CM | POA: Insufficient documentation

## 2022-10-13 DIAGNOSIS — C61 Malignant neoplasm of prostate: Secondary | ICD-10-CM | POA: Diagnosis not present

## 2022-10-13 LAB — CBC WITH DIFFERENTIAL (CANCER CENTER ONLY)
Abs Immature Granulocytes: 0.01 10*3/uL (ref 0.00–0.07)
Basophils Absolute: 0 10*3/uL (ref 0.0–0.1)
Basophils Relative: 0 %
Eosinophils Absolute: 0.1 10*3/uL (ref 0.0–0.5)
Eosinophils Relative: 3 %
HCT: 35.6 % — ABNORMAL LOW (ref 39.0–52.0)
Hemoglobin: 12.5 g/dL — ABNORMAL LOW (ref 13.0–17.0)
Immature Granulocytes: 0 %
Lymphocytes Relative: 10 %
Lymphs Abs: 0.3 10*3/uL — ABNORMAL LOW (ref 0.7–4.0)
MCH: 33.9 pg (ref 26.0–34.0)
MCHC: 35.1 g/dL (ref 30.0–36.0)
MCV: 96.5 fL (ref 80.0–100.0)
Monocytes Absolute: 0.4 10*3/uL (ref 0.1–1.0)
Monocytes Relative: 12 %
Neutro Abs: 2.2 10*3/uL (ref 1.7–7.7)
Neutrophils Relative %: 75 %
Platelet Count: 197 10*3/uL (ref 150–400)
RBC: 3.69 MIL/uL — ABNORMAL LOW (ref 4.22–5.81)
RDW: 13.2 % (ref 11.5–15.5)
WBC Count: 3 10*3/uL — ABNORMAL LOW (ref 4.0–10.5)
nRBC: 0 % (ref 0.0–0.2)

## 2022-10-13 LAB — CMP (CANCER CENTER ONLY)
ALT: 19 U/L (ref 0–44)
AST: 21 U/L (ref 15–41)
Albumin: 4.2 g/dL (ref 3.5–5.0)
Alkaline Phosphatase: 113 U/L (ref 38–126)
Anion gap: 4 — ABNORMAL LOW (ref 5–15)
BUN: 32 mg/dL — ABNORMAL HIGH (ref 8–23)
CO2: 31 mmol/L (ref 22–32)
Calcium: 9.2 mg/dL (ref 8.9–10.3)
Chloride: 95 mmol/L — ABNORMAL LOW (ref 98–111)
Creatinine: 0.67 mg/dL (ref 0.61–1.24)
GFR, Estimated: >60 mL/min (ref >60)
Glucose, Bld: 113 mg/dL — ABNORMAL HIGH (ref 70–99)
Potassium: 4.6 mmol/L (ref 3.5–5.1)
Sodium: 130 mmol/L — ABNORMAL LOW (ref 135–145)
Total Bilirubin: 1.2 mg/dL (ref 0.3–1.2)
Total Protein: 6.7 g/dL (ref 6.5–8.1)

## 2022-10-13 LAB — SAMPLE TO BLOOD BANK

## 2022-10-13 LAB — FERRITIN: Ferritin: 173 ng/mL (ref 24–336)

## 2022-10-13 NOTE — Progress Notes (Signed)
[place]   [phone] [phone]   Clinic Follow up Note   Patient Care Team: [name], FNP as PCP - General (Family Medicine) Cira Rue, RN Nurse Navigator as Registered Nurse (Medical Oncology)  Date of Service:  10/13/2022  CHIEF COMPLAINT: f/u of Anemia     CURRENT THERAPY:  1) ferrous sulfate 325 mg p.o. daily    ASSESSMENT:  Cody Gonzalez is a 76 y.o. male with   Anemia -he developed severe anemia after spinal surgery and abscess  -lab was negative for nutritional anemia -improved overall   Malignant neoplasm of prostate ([facility])  stage T1c adenocarcinoma of the prostate Gleason score 4+4 and PSA 9.06 Diagnosed in 2020, completed definitive radiotherapy 06/29/2019 - 09/23/2019 Managed by urologist Dr. Tresa Moore and radiation oncologist Dr. Tammi Klippel Last PSA reportedly normal per patient/spouse    PLAN: -Lab reviewed hemoglobin 12.5 -Encourage to continue oral Ferrous sulfate - lab and f/u in 1 year, he will repeat CBC with other doctors in next year or call me if needed    SUMMARY OF ONCOLOGIC HISTORY: Oncology History   No history exists.     INTERVAL HISTORY:  Cody Gonzalez is here for a follow up of  Anemia   He was last seen by PA-C Cassie on 06/13/2022 He presents to the clinic accompanied by wife. Pt reports he feels well. Pt states he in doing Physical therapy and his wife said he I also gettiing injections.   All other systems were reviewed with the patient and are negative.  MEDICAL HISTORY:  Past Medical History:  Diagnosis Date   Anemia    Hypertension    Prostate cancer ([place])    Sleep apnea    uses cpap    TBI (traumatic brain injury) ([place]) 2015    SURGICAL HISTORY: Past Surgical History:  Procedure Laterality Date   BACK SURGERY     BASAL CELL CARCINOMA EXCISION     BILATERAL CARPAL TUNNEL RELEASE  2014   CYSTOSCOPY N/A 06/29/2019   Procedure: CYSTOSCOPY;  Surgeon: Alexis Frock, MD;  [facility];  Service: Urology;  Laterality: N/A;  No seeds in bladder per Dr. Lilli Light LAMINECTOMY/DECOMPRESSION MICRODISCECTOMY Left 05/28/2021   Procedure: Left  - Lumbar five-Sacral one Laminectomy resection of synovial cyst;  Surgeon: Earnie Larsson, MD;  Location: [place];  Service: Neurosurgery;  Laterality: Left;   PROSTATE BIOPSY     RADIOACTIVE SEED IMPLANT N/A 06/29/2019   Procedure: RADIOACTIVE SEED IMPLANT/BRACHYTHERAPY IMPLANT;  Surgeon: Alexis Frock, MD;  Location: [facility];  Service: Urology;  Laterality: N/A;  [place] N/A 06/29/2019   Procedure: SPACE OAR INSTILLATION;  Surgeon: Alexis Frock, MD;  Location: [facility];  Service: Urology;  Laterality: N/A;    I have reviewed the social history and family history with the patient and they are unchanged from previous note.  ALLERGIES:  is allergic to nifedipine and amlodipine besylate.  MEDICATIONS:  Current Outpatient Medications  Medication Sig Dispense Refill   Acetaminophen (TYLENOL 8 HOUR PO)      alfuzosin (UROXATRAL) 10 MG 24 hr tablet Take 10 mg by mouth at bedtime.     ascorbic acid (VITAMIN C) 1000 MG tablet Take 1,000 mg by mouth daily.     atorvastatin (LIPITOR) 40 MG tablet Take 40 mg by mouth at bedtime.     b complex vitamins capsule Take 1 capsule by  mouth daily.     calcium carbonate (OSCAL) 1500 (600 Ca) MG TABS tablet 2 tablets once daily     carvedilol (COREG) 6.25 MG tablet Take 1 tablet (6.25 mg total) by mouth 2 (two) times daily. 180 tablet 3   Cholecalciferol 50 MCG (2000 UT) CAPS Take 2,000 Units by mouth daily.     docusate sodium (COLACE) 50 MG capsule Take 50 mg by mouth as needed for mild constipation.     ferrous sulfate 325 (65 FE) MG tablet Take 325 mg by mouth daily with breakfast.     Multiple Vitamins-Iron (MULTIVITAMIN/IRON PO) Take 1 tablet by mouth daily with breakfast.      Romosozumab-aqqg ([facility] [place]) Inject 1 mL into the skin every 30 (thirty) days.     telmisartan (MICARDIS) 80 MG tablet Take 80 mg by mouth daily.     UNABLE TO FIND Take 15 mg by mouth daily. Med Name: Cody Gonzalez     No current facility-administered medications for this visit.    PHYSICAL EXAMINATION: ECOG PERFORMANCE STATUS: 1 - Symptomatic but completely ambulatory  Vitals:   10/13/22 1023  BP: (!) 160/62  Pulse: 60  Resp: 18  Temp: 98 F (36.7 C)  SpO2: 98%   Wt Readings from Last 3 Encounters:  10/13/22 158 lb 9.6 oz (71.9 kg)  08/20/22 159 lb (72.1 kg)  07/02/22 154 lb (69.9 kg)     GENERAL:alert, no distress and comfortable SKIN: skin color normal, no rashes or significant lesions EYES: normal, Conjunctiva are pink and non-injected, sclera clear  NEURO: alert & oriented x 3 with fluent speech LABORATORY DATA:  I have reviewed the data as listed    Latest Ref Rng & Units 10/13/2022    9:43 AM 08/13/2022    9:48 AM 06/13/2022    9:41 AM  CBC  WBC 4.0 - 10.5 K/uL 3.0  3.4  2.7   Hemoglobin 13.0 - 17.0 g/dL 12.5  12.1  9.5   Hematocrit 39.0 - 52.0 % 35.6  34.7  27.5   Platelets 150 - 400 K/uL 197  211  217         Latest Ref Rng & Units 10/13/2022    9:43 AM 08/13/2022    9:48 AM 06/13/2022    9:41 AM  CMP  Glucose 70 - 99 mg/dL 113  95  103   BUN 8 - 23 mg/dL 32  31  38   Creatinine 0.61 - 1.24 mg/dL 0.67  0.64  0.60   Sodium 135 - 145 mmol/L 130  128  127   Potassium 3.5 - 5.1 mmol/L 4.6  4.0  4.3   Chloride 98 - 111 mmol/L 95  95  95   CO2 22 - 32 mmol/L '31  28  26   '$ Calcium 8.9 - 10.3 mg/dL 9.2  8.7  8.6   Total Protein 6.5 - 8.1 g/dL 6.7  6.8  6.5   Total Bilirubin 0.3 - 1.2 mg/dL 1.2  1.1  0.8   Alkaline Phos 38 - 126 U/L 113  109  138   AST 15 - 41 U/L '21  18  14   '$ ALT 0 - 44 U/L '19  18  12       '$ RADIOGRAPHIC STUDIES: I have personally reviewed the radiological images as listed and agreed with the findings in the report. No results found.     No orders of the defined types were placed in this encounter.  All questions were answered.  The patient knows to call the clinic with any problems, questions or concerns. No barriers to learning was detected. The total time spent in the appointment was 15 minutes.     Truitt Merle, MD 10/13/2022   Felicity Coyer, CMA, am acting as scribe for Truitt Merle, MD.   I have reviewed the above documentation for accuracy and completeness, and I agree with the above.

## 2022-10-29 ENCOUNTER — Ambulatory Visit: Payer: Medicare Other

## 2022-10-29 DIAGNOSIS — R4701 Aphasia: Secondary | ICD-10-CM

## 2022-10-29 DIAGNOSIS — R41841 Cognitive communication deficit: Secondary | ICD-10-CM | POA: Diagnosis not present

## 2022-10-29 NOTE — Patient Instructions (Signed)
Local Driver Evaluation Programs: ° °Comprehensive Evaluation: includes clinical and in vehicle behind the wheel testing by OCCUPATIONAL THERAPIST. Programs have varying levels of adaptive controls available for trial.  ° °[facility] °[address] °[place], [place]  [address] °[phone] or [phone] °http://www.driver-rehab.com °Evaluator:  Cyndee Crompton, OT/CDRS/CDI/SCDCM/Low Vision Certification ° °[facility] °[address] °[place], [place] [address] °[phone] °[email or web address]://[email or web address].org/home/services/rehabilitation.aspx °Evaluators:  Shannon Sheek, OT and Jill Tucker, OT ° °[facility] [place] [facility] (ONLY SERVES VETERANS!!) °[facility] °[address] °[place], [place]  [address] °[phone] °http://www.[place].gov/services/Physical_Medicine_Rehabilitation_Services.asp °Evaluators:  Eric Andrews, KT; Heidi Harris, KT;  Gary Whitaker, KT (KT=kiniesotherapist) ° ° °Clinical evaluations only:  Includes clinical testing, refers to other programs or local certified driving instructor for behind the wheel testing. ° °[facility] (outpatient Rehab) °[facility] °[address] °[place], [place] [address] °[phone] for scheduling °http://[email or web address]/Outpatient-Rehabilitation/Neurorehabilitation-Therapy.htm °Evaluators:  Kelly Lambeth, OT; Kate Phillips, OT ° °Other area clinical evaluators available upon request including [facility], [facility] and [facility]. ° ° °    Resource List °What is a Driver Evaluation: °Your Road Ahead - A Guide to Comprehensive Driving Evaluations °http://www.thehartford.com/resources/mature-market-excellence/publications-on-aging ° °Association for [employer] - Disability and Driving Fact Sheets °http://www.aded.net/?page=510 ° °Driving after a Brain  Injury: °[employer] °http://www.biausa.org/tbims-abstracts/if-there-is-an-effective-way-to-determine-if-someone-is-ready-to-drive-after-tbi?A=SearchResult&SearchID=9495675&ObjectID=2758842&ObjectType=35 ° °Driving with Adaptive Equipment: °[facility] °http://www.driver-rehab.com/adaptive-equipment ° °[employer] °http://www.nmeda.com/ ° ° ° ° ° ° °  °
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
## 2022-11-10 ENCOUNTER — Encounter: Payer: Self-pay | Admitting: Podiatry

## 2022-11-10 ENCOUNTER — Ambulatory Visit (INDEPENDENT_AMBULATORY_CARE_PROVIDER_SITE_OTHER): Payer: Medicare Other | Admitting: Podiatry

## 2022-11-10 DIAGNOSIS — M79674 Pain in right toe(s): Secondary | ICD-10-CM

## 2022-11-10 DIAGNOSIS — M79675 Pain in left toe(s): Secondary | ICD-10-CM

## 2022-11-10 DIAGNOSIS — B351 Tinea unguium: Secondary | ICD-10-CM

## 2022-11-10 NOTE — Progress Notes (Signed)
This patient returns to the office for evaluation and treatment of long thick painful nails .  This patient is unable to trim his own nails since the patient cannot reach his feet.  Patient says the nails are painful walking and wearing his shoes.  He returns for preventive foot care services.  General Appearance  Alert, conversant and in no acute stress.  Vascular  Dorsalis pedis and posterior tibial  pulses are palpable  bilaterally.  Capillary return is within normal limits  bilaterally. Temperature is within normal limits  bilaterally.  Neurologic  Senn-Weinstein monofilament wire test within normal limits  bilaterally. Muscle power within normal limits bilaterally.  Nails Thick disfigured discolored nails with subungual debris hallux nails   bilaterally. No evidence of bacterial infection or drainage bilaterally.  Orthopedic  No limitations of motion  feet .  No crepitus or effusions noted.  No bony pathology or digital deformities noted.  Skin  normotropic skin with no porokeratosis noted bilaterally.  No signs of infections or ulcers noted.     Onychomycosis  Pain in toes right foot  Pain in toes left foot  Debridement  of nails  1-5  B/L with a nail nipper.  Nails were then filed using a dremel tool with no incidents.    RTC  3 months   Gardiner Barefoot DPM

## 2022-11-12 ENCOUNTER — Ambulatory Visit: Payer: Medicare Other | Attending: Nurse Practitioner

## 2022-11-12 DIAGNOSIS — R41841 Cognitive communication deficit: Secondary | ICD-10-CM | POA: Diagnosis present
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
## 2022-11-26 ENCOUNTER — Encounter: Payer: Self-pay | Admitting: Neurology

## 2022-11-26 ENCOUNTER — Ambulatory Visit: Payer: Medicare Other

## 2022-11-26 DIAGNOSIS — R41841 Cognitive communication deficit: Secondary | ICD-10-CM | POA: Diagnosis not present
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
## 2022-11-27 ENCOUNTER — Encounter (HOSPITAL_BASED_OUTPATIENT_CLINIC_OR_DEPARTMENT_OTHER): Payer: Self-pay

## 2022-11-27 ENCOUNTER — Emergency Department (HOSPITAL_BASED_OUTPATIENT_CLINIC_OR_DEPARTMENT_OTHER): Payer: Medicare Other

## 2022-11-27 ENCOUNTER — Other Ambulatory Visit: Payer: Self-pay

## 2022-11-27 ENCOUNTER — Emergency Department (HOSPITAL_BASED_OUTPATIENT_CLINIC_OR_DEPARTMENT_OTHER)
Admission: EM | Admit: 2022-11-27 | Discharge: 2022-11-27 | Disposition: A | Discharge status: Home / Self Care | Payer: Medicare Other | Attending: Emergency Medicine | Admitting: Emergency Medicine

## 2022-11-27 ENCOUNTER — Inpatient Hospital Stay: Admission: RE | Admit: 2022-11-27 | Payer: Self-pay | Source: Ambulatory Visit

## 2022-11-27 DIAGNOSIS — I10 Essential (primary) hypertension: Secondary | ICD-10-CM | POA: Insufficient documentation

## 2022-11-27 DIAGNOSIS — G510 Bell's palsy: Secondary | ICD-10-CM | POA: Insufficient documentation

## 2022-11-27 DIAGNOSIS — Z79899 Other long term (current) drug therapy: Secondary | ICD-10-CM | POA: Diagnosis not present

## 2022-11-27 DIAGNOSIS — R29810 Facial weakness: Secondary | ICD-10-CM | POA: Diagnosis present

## 2022-11-27 MED ORDER — PREDNISONE 50 MG PO TABS
60.0000 mg | ORAL_TABLET | Freq: Once | ORAL | Status: AC
  Administered 2022-11-27: 60 mg via ORAL
  Filled 2022-11-27: qty 1

## 2022-11-27 MED ORDER — VALACYCLOVIR HCL 1 G PO TABS: 1000.0000 mg | ORAL_TABLET | Freq: Three times a day (TID) | ORAL | 0 refills | Status: AC

## 2022-11-27 MED ORDER — VALACYCLOVIR HCL 500 MG PO TABS
1000.0000 mg | ORAL_TABLET | Freq: Once | ORAL | Status: AC
  Administered 2022-11-27: 1000 mg via ORAL
  Filled 2022-11-27: qty 2

## 2022-11-27 MED ORDER — ARTIFICIAL TEARS OPHTHALMIC OINT: TOPICAL_OINTMENT | OPHTHALMIC | Status: DC | PRN

## 2022-11-27 MED ORDER — ARTIFICIAL TEARS OPHTHALMIC OINT
TOPICAL_OINTMENT | Freq: Once | OPHTHALMIC | Status: AC
  Filled 2022-11-27: qty 3.5

## 2022-11-27 MED ORDER — PREDNISONE 20 MG PO TABS: 60.0000 mg | ORAL_TABLET | Freq: Every day | ORAL | 0 refills | Status: AC

## 2022-11-27 NOTE — ED Triage Notes (Signed)
Patient here POV from Home.  Endorses Fell 48 Hours ago. At 0200 he awoke to use the Bathroom when he lost his balance and fell. No Pain. Does not recall fall too well.   Began to have a Right Sided Facial Droop Tuesday Mildly but worsened today.   No Anticoagulants. No Pain.   NAD Noted during Triage. A&Ox4. GCS 15. Ambulatory.

## 2022-11-27 NOTE — ED Provider Notes (Addendum)
[facility] Provider Note   CSN: [id number] Arrival date & time: 11/27/22  1234     History  Chief Complaint  Patient presents with   Facial Droop    CARLOSDANIEL GROB is a 77 y.o. male.  HPI     77 year old patient comes in with chief complaint of facial droop.  According to the patient's wife, 3 days ago she is noticed some subtle changes to the right side of the face.  This morning however that face looks much worse.  There is pronounced facial droop.  Patient denies any focal weakness, numbness, vision change, balance issues.  He denies any drooling or taste issues.  No history of stroke.  Home Medications Prior to Admission medications   Medication Sig Start Date End Date Taking? Authorizing Provider  predniSONE (DELTASONE) 20 MG tablet Take 3 tablets (60 mg total) by mouth daily for 6 days. 11/27/22 12/03/22 Yes Varney Biles, MD  valACYclovir (VALTREX) 1000 MG tablet Take 1 tablet (1,000 mg total) by mouth 3 (three) times daily for 7 days. 11/27/22 12/04/22 Yes Varney Biles, MD  alfuzosin (UROXATRAL) 10 MG 24 hr tablet Take 10 mg by mouth at bedtime.    [provider]  ascorbic acid (VITAMIN C) 1000 MG tablet Take 1,000 mg by mouth daily.    [provider]  atorvastatin (LIPITOR) 40 MG tablet Take 40 mg by mouth at bedtime. 09/01/19   [provider]  b complex vitamins capsule Take 1 capsule by mouth daily.    [provider]  calcium carbonate (OSCAL) 1500 (600 Ca) MG TABS tablet 2 tablets once daily    [provider]  carvedilol (COREG) 6.25 MG tablet Take 1 tablet (6.25 mg total) by mouth 2 (two) times daily. 10/08/22   Tolia, Sunit, DO  Cholecalciferol 50 MCG (2000 UT) CAPS Take 2,000 Units by mouth daily.    [provider]  ferrous sulfate 325 (65 FE) MG tablet Take 325 mg by mouth daily with breakfast.    [provider]  Multiple Vitamins-Iron  (MULTIVITAMIN/IRON PO) Take 1 tablet by mouth daily with breakfast.    [provider]  Romosozumab-aqqg (EVENITY [place]) Inject 1 mL into the skin every 30 (thirty) days.    [provider]  telmisartan (MICARDIS) 80 MG tablet Take 80 mg by mouth daily.    [provider]  UNABLE TO FIND Take 15 mg by mouth daily. Med Name: [name]    [provider]      Allergies    Nifedipine and Amlodipine besylate    Review of Systems   Review of Systems  Physical Exam Updated Vital Signs BP (!) 195/74   Pulse (!) 56   Temp (!) 97.2 F (36.2 C) (Temporal)   Resp 20   Ht '5\' 7"'$  (1.702 m)   Wt 71.9 kg   SpO2 97%   BMI 24.83 kg/m  Physical Exam Vitals and nursing note reviewed.  Constitutional:      Appearance: He is well-developed.  HENT:     Head: Atraumatic.  Eyes:     Comments: Patient's right eye is tearing  Cardiovascular:     Rate and Rhythm: Normal rate.  Pulmonary:     Effort: Pulmonary effort is normal.  Musculoskeletal:     Cervical back: Neck supple.  Skin:    General: Skin is warm.  Neurological:     Mental Status: He is alert and oriented to person, place,  and time.     Comments: Patient has right-sided facial droop without central sparing.  Gross sensory exam is normal over face, upper and lower extremities.  Motor exam is normal for both upper and lower extremities.  Extraocular muscles intact.     ED Results / Procedures / Treatments   Labs (all labs ordered are listed, but only abnormal results are displayed) Labs Reviewed - No data to display  EKG None  Radiology CT Head Wo Contrast  Result Date: 11/27/2022 CLINICAL DATA:  Neuro deficit, acute, stroke suspected Facial paralysis/weakness (CN 7) facial droop EXAM: CT HEAD WITHOUT CONTRAST TECHNIQUE: Contiguous axial images were obtained from the base of the skull through the vertex without intravenous contrast. RADIATION DOSE REDUCTION: This exam was performed according to the  departmental dose-optimization program which includes automated exposure control, adjustment of the mA and/or kV according to patient size and/or use of iterative reconstruction technique. COMPARISON:  None Available. FINDINGS: Brain: No evidence of acute infarction, hemorrhage, hydrocephalus, extra-axial collection or mass lesion/mass effect. Patchy white matter hypodensities, nonspecific but compatible with chronic microvascular ischemic disease. Vascular: No hyperdense vessel identified. Skull: No acute fracture. Sinuses/Orbits: No acute fracture. Other: No mastoid effusions. IMPRESSION: No evidence of acute intracranial abnormality. Electronically Signed   By: Margaretha Sheffield M.D.   On: 11/27/2022 14:54    Procedures Procedures    Medications Ordered in ED Medications  predniSONE (DELTASONE) tablet 60 mg (60 mg Oral Given 11/27/22 1535)  valACYclovir (VALTREX) tablet 1,000 mg (1,000 mg Oral Given 11/27/22 1536)  artificial tears (LACRILUBE) ophthalmic ointment ( Both Eyes Given 11/27/22 1535)    ED Course/ Medical Decision Making/ A&P Clinical Course as of 11/27/22 1607  Thu Nov 27, 2022  1607 BP(!): 195/74 BP went higher as patient stayed in the ER.  He has a PCP.  He is agitated about being in the emergency room, which could be contributing.  No need to treat the blood pressure in the ER. [AN]    Clinical Course User Index [AN] Varney Biles, MD                             Medical Decision Making This patient presents to the ED with chief complaint(s) of right-sided facial droop with pertinent past medical history of TBI, hypertension.The complaint involves an extensive differential diagnosis and also carries with it a high risk of complications and morbidity.    Patient noted to be hypertensive, but besides the facial droop, there is no other focal complaints from patient side.  No chest pain, shortness of breath. The differential diagnosis includes acute stroke, Bell's palsy,  hypertensive emergency, intracranial bleed.   Patient has clear central sparing, no other neurodeficits and high suspicion of Bell's palsy at this time.  However, he did have a recent fall prior to the symptoms starting, therefore we will get CT scan of the brain to ensure there is no evidence of subacute stroke or brain bleed/subdural hematoma.   Additional history obtained: Additional history obtained from spouse Records reviewed previous admission documents   Independent visualization and interpretation of imaging: - I independently visualized the following imaging with scope of interpretation limited to determining acute life threatening conditions related to emergency care: CT scan of the brain is, which revealed no evidence of brain bleed.  Treatment and Reassessment: I discussed with the family that if the CT brain is negative, we will treat this as Bell's palsy.  I  did mention to them the need to protect the eye properly, as it is clearly irritated during my assessment. Patient already has an appointment with his neurologist in 11 days, which is reassuring.  Stable for discharge.  Problems Addressed: Bell's palsy: acute illness or injury Uncontrolled hypertension: acute illness or injury  Amount and/or Complexity of Data Reviewed Radiology: ordered.  Risk Prescription drug management.    Final Clinical Impression(s) / ED Diagnoses Final diagnoses:  Bell's palsy  Uncontrolled hypertension    Rx / DC Orders ED Discharge Orders          Ordered    valACYclovir (VALTREX) 1000 MG tablet  3 times daily        11/27/22 1520    predniSONE (DELTASONE) 20 MG tablet  Daily        11/27/22 [place], Feliciana Narayan, MD 11/27/22 1608

## 2022-11-27 NOTE — Discharge Instructions (Addendum)
You are seen in the ER for facial droop. You have Bell's palsy.  CT scan of the brain is negative for any acute finding.  Please take the medications that are prescribed.  See the neurologist as planned in February.  In order to protect your eyes, apply artificial tear ointment every 4-6 hours.  Also during daytime consider wearing protective eyewear such as glasses.  At nighttime, apply ointment to the eye before you go to sleep.  You may want to consider taping the eye with medical grade waterproof transparent dressing.

## 2022-12-09 ENCOUNTER — Ambulatory Visit (INDEPENDENT_AMBULATORY_CARE_PROVIDER_SITE_OTHER): Payer: Medicare Other | Admitting: Neurology

## 2022-12-09 ENCOUNTER — Encounter: Payer: Self-pay | Admitting: Neurology

## 2022-12-09 ENCOUNTER — Other Ambulatory Visit: Payer: Self-pay | Admitting: Physician Assistant

## 2022-12-09 VITALS — BP 151/70 | HR 60 | Ht 67.0 in | Wt 160.0 lb

## 2022-12-09 DIAGNOSIS — H18891 Other specified disorders of cornea, right eye: Secondary | ICD-10-CM

## 2022-12-09 DIAGNOSIS — M810 Age-related osteoporosis without current pathological fracture: Secondary | ICD-10-CM

## 2022-12-09 DIAGNOSIS — G510 Bell's palsy: Secondary | ICD-10-CM

## 2022-12-09 MED ORDER — ARTIFICIAL TEARS 83-15 % OP OINT: TOPICAL_OINTMENT | OPHTHALMIC | 1 refills | Status: DC

## 2022-12-09 NOTE — Progress Notes (Signed)
PATIENT: Elmarie Mainland DOB: Jun 10, 1946  REASON FOR VISIT: follow up HISTORY FROM: patient and wife,  both present.   HISTORY OF PRESENT ILLNESS:   12/09/22: Rv 12-09-2022: Mr. Worlds, a patient with many back surgeries, a TBI and progressive memory problems presents after  had a Bells palsy on 11-26-2022, was seen at PCP and ER and given a dry eye protection.  He is still in phase of recovery. Deltasone and valtrex were given too.  He reports CPAP use is regular.  He uses a [employer] model which was replaced after his initial machine was recalled.  And he is using an auto CPAP at a flex with a setting between 6 and 15 cmH2O and 3 cm flexibility expiratory relief.  He has a ramp time of 15 minutes starting at 5 cm water the humidifier is set at 5 which right now after Bell's palsy is very important to keep the mouth moist as he may have trouble keeping a close.  And he is 100% compliant by days and 100% of these days over 4 hours on average 9 hours 16 minutes.  The 90% pressure percentile is 10.7 cm water well within his current settings and the residual AHI is 8.3/h.  I would be satisfied. He uses the reservoir.    Memory : STM affected, he has still speech therapy-   Epworth Score: 0/ 24 points.  FSS at 13/ 63 points.     07-02-2022: This is a RV after back surgery, and he is recovering well. He had delirium after surgery again, had General anesthesia 4 times ! Inpatient PT was completed, now ambulatory PT, ST, OT.less pan, less medication, his sodium has been controlled, after having hyponatremia. Had ONLY one fall in the last 3 months.  Here for memory testing today- is delirium related to dementia?  His sleep study in 02-05-2022 was positive for apnea and controlled by simple CPAP If CPAP titration under an E son nasal mask started at 5 and was advanced to a total 8 cm pressure but he did best at 6 cm pressure only this is a low pressure requirement.  The patient had  at that time endorsed the Epworth Sleepiness Scale at 0 points in the fatigue severity score at 11.  With his residual mild apnea he presents today via looking at his home results he is using an a flex CPAP over the last 30 days he has used it every night with an average of 7 hours 37 minutes.  And his peak pressure is 10.7 cm water he is not creating large air leaks.  His average AHI is still 9.4 in contrast to what we saw in the sleep study.  I will ask him to try to avoid supine sleep also with recent back surgeries and a habit of sleeping on his back, this may be difficult.  We have in the past recommended that a tennis ball sewn to a T-shirt so that the patient is notched and will not rest on his back but after his back surgeries I would be hesitant to recommend anything that.  Resistance or pressure on the back and he may do better just by putting a bar of soap under the sheet in the middle of the bed it will avoid supine sleep because you are only getting a comfortable sleep position to the left or right of the bar.    07/02/2022    9:21 AM 01/05/2020    3:14 PM 07/04/2019  1:03 PM  Montreal Arts development officer (0/5) '4 5 5  '$ Naming (0/3) '3 3 3  '$ Attention: Read list of digits (0/2) '2 2 1  '$ Attention: Read list of letters (0/1) '1 1 1  '$ Attention: Serial 7 subtraction starting at 100 (0/3) '1 1 3  '$ Language: Repeat phrase (0/2) '2 2 2  '$ Language : Fluency (0/1) 0 1 1  Abstraction (0/2) '2 2 2  '$ Delayed Recall (0/5) 0 3 1  Orientation (0/6) '4 6 5  '$ Total '19 26 24  '$ Adjusted Score (based on education) 19 26    He managed a trail making test, but has trouble generating words, missed all 5 recall words.     CPAP had allowed a reduction in AHI to 9/h, and he felt good using it. Now , after surgery and complications form surgery he just started back on auto CPAP and his device reports central apneas emerging, His narcotic pain medication are weaned off now and after that we  should invite him for a attended sleep study, titration to CPAP to BiPAP or ST.   Postsurgical infection, repeated surgery- There was stenosis a Ll2- L4-5 . He is just now recovering. Vancomycin and Ceftriaxone, ID follow up next Tuesday. [facility] print out brought by spouse; He was found to be anemic which may explain his other problem:  3 times syncope- once at [facility], one yesterday , was seen in ED, cardiology referral pending .        RM 10 with spouse. Last seen 03/19/21. He had back surgery last 12/02/21,  He had a cyst on his spinal cord, and was affecting gait .  Nerve Compression between L 2 and S1-   Post surgical developed infection, had emergency surgery on 12/15/21. Cyst returned ! , he changed to [facility] spine -  Wearing back brace. Ambulates with walker. Getting IV antibiotics right now. Doing home PT twice weekly [facility]).  Went to [facility] 01/03/22 for syncope. No reason found. Waiting on referral to Cardiology for further work up.  Having increased memory issues, progression of dementia.  [name] overall has been originally a patient of our sleep clinic and he was not able to use his CPAP for a while with multiple hospitalizations surgical protocols etc.  He just resumed auto CPAP in early March and I have 8 days of data available here the average CPAP pressure was 11.3 cmH2O the 90th percentile pressure was 15 cmH2O and he did have several episodes of periodic breathing at night also known as Cheyne-Stokes respiration. No high air leak noted. Good mask fit. .  The AHI is very high at 25.2 and it indicates that these are central apneas the so-called clean airway apnea index.   He is using a dream station to CPAP and the maximum pressure is set at 15 cm water.  The minimum at 6 cmH2O the average user time was 7 hours 57 minutes since he returned to CPAP use.  I do not like to see that he seems to have clearly central apneas on his download and that CPAP may actually at this time  increase his apnea index.  He also may still be on oxycodone.me An narcotic pain medication can increase central apneas usually within 30 minutes of intake time and lasting for up to 3 hours.  So I cannot breakdown at which time during the night the central apneas may peak but I do think that will likely complication of oxycodone.   I  do not see any other medications here that would explain his conversion to central apnea and of course that can be nonmedication related reasons as well.  With his wife reports that his cognition is also more challenged and more difficult for him to remember especially short-term memory that may be an underlying neurodegenerative disorder that contributes to the evolution into central apnea.      03-19-2021: Interval history:Pt with wife, rm 45. Following up. He did had neck surgery early March and initially after the surgery was recovering well.  More recently he has started to notice the balance beginning to get off again and right leg is weaker. He has followed with NeuroSurgery fusion-an lumbar and cervical MRI is ordered for tomorrow to further evaluate this concern. X ray showed normal alignment.   CPAP: He is on auto PAP , residual AHI is 9/h- too high, not related to high air leaks. He is  doing well, no issues or concerns. Sleeps well. DME. [employer] [facility]).       12-12-2020 EMG and NCV showed polyneuropathy, axonal. Not a peripheral injury. CT lumbar spine shows L4-5 severe spinal stenosis, which is below the spinal cord level. Arthropathy.  ONO has not resulted yet. I will send [name] .  to a neurosurgical consult for the lumbar spinal stenosis.  No pain, just sudden loss of leg strength. His right side is "giving out" no pain.    Patient is vaccinated and had the booster, presenting with improved sleep- continued to use CPAP.  The patient has continued to use his CPAP machine which is on a flex Respironics machine 100% of the time and by day  average 8 hours and 5 minutes per night.  He is an excellent highly compliant patient he uses the 90 percentile pressure of 10 cm water.  His average AHI however strikes me as high as 13.4/h his settings are such that that has a minimum setting of 6 a maximum of 15 out of 3 cm flexibility exhalation relief.  Ramp time is 15 minutes started 4 cmH2O.  The humidifier has been set at a low or of function from as I cannot differentiate why [name] would have higher AHI now -I do not see a air leak data or central versus OSA. I offered a home ONO- t based on results I may need [name].to return for an attended titration.  He reports more stiffness, gait impairment, and he is getting slower. He  Is leaning sideways.  PCP evaluated him since last year- 04-19-2020; group bicycle ride let to another fall. He had PT until November. His gait changed after that- more lower back pan- and only some radiation- achiness in the thigh.   I noted the patient asked questions over and over- definitely memory changes.     Mr. Mckeithan is a 77 year old male with a history of obstructive sleep apnea on CPAP.  He returns today for follow-up.  His download indicates that he uses his machine nightly for compliance of 100%.  He uses his machine greater than 4 hours each night.  On average he uses his machine 7 hours and 40 minutes.  His residual AHI is 13.2 on 6 to 10 cm of water with EPR of 3.  He reports that the CPAP is working well for him.  He felt that his memory has remained stable.  He states that he sometimes has issues with immediate recall.  He continues to live at home with his spouse.  Able to complete all ADLs independently.  He returns today for an evaluation.  HISTORY 07/04/19:   Mr. Santucci is a 77 year old male with a history of obstructive sleep apnea on CPAP and memory disturbance.  He returns today for evaluation of his memory.  Overall he feels that his memory is stable.  He states that sometimes  he draws a blank but after several minutes he is able to recall what he needs to.  He denies any reports by his family or spouse of memory trouble.  He lives at home with his spouse.  He is able to complete all ADLs independently.  His wife handles the finances.  He reports that she is always done this.  He operates a Teacher, music without difficulty.  He reports that sometimes he has decreased confidence with directions but is always going the right way.  He does help with meals.  Reports that he does bake bread frequently.  He is able to remember recipes.  Overall he feels that he is doing well.  He returns today for an evaluation.  REVIEW OF SYSTEMS: Out of a complete 14 system review of symptoms, the patient complains only of the following symptoms, and all other reviewed systems are negative.  FSS 11 How likely are you to doze in the following situations: 0 = not likely, 1 = slight chance, 2 = moderate chance, 3 = high chance  Sitting and Reading? Watching Television? Sitting inactive in a public place (theater or meeting)? Lying down in the afternoon when circumstances permit? Sitting and talking to someone? Sitting quietly after lunch without alcohol? In a car, while stopped for a few minutes in traffic? As a passenger in a car for an hour without a break?  Total = 0/ 24     Labs: Anemia , takes vit c and ferrous sulfate.            Component Ref Range & Units 1 mo ago (10/13/22) 3 mo ago (08/13/22) 5 mo ago (06/13/22) 8 mo ago (04/05/22) 9 mo ago (02/20/22) 10 mo ago (02/10/22) 10 mo ago (01/29/22)  WBC Count 4.0 - 10.5 K/uL 3.0 Low  3.4 Low  2.7 Low  6.8 4.3 4.8 3.7 Low   RBC 4.22 - 5.81 MIL/uL 3.69 Low  3.66 Low  2.90 Low  3.19 Low  3.13 Low  3.48 Low  3.44 Low   Hemoglobin 13.0 - 17.0 g/dL 12.5 Low  12.1 Low  9.5 Low  10.1 Low  10.0 Low  10.9 Low  10.9 Low   HCT 39.0 - 52.0 % 35.6 Low  34.7 Low  27.5 Low  29.4 Low  28.9 Low  33.4 Low  31.6 Low   MCV 80.0 - 100.0 fL 96.5 94.8  94.8 92.2 92.3 96.0 91.9                Component Ref Range & Units 1 mo ago (10/13/22) 3 mo ago (08/13/22) 5 mo ago (06/13/22) 8 mo ago (04/05/22) 9 mo ago (02/20/22) 10 mo ago (02/10/22) 10 mo ago (02/04/22)  Sodium 135 - 145 mmol/L 130 Low  128 Low  127 Low  122 Low  125 Low  127 Low  128 Low  R  Potassium 3.5 - 5.1 mmol/L 4.6 4.0 4.3 4.0 4.0 4.7 4.0 R  Chloride 98 - 111 mmol/L 95 Low  95 Low  95 Low  87 Low  94 Low  95 Low  91 Low  R  CO2 22 -  32 mmol/L '31 28 26 26 24 21 '$ Low  18 Low  R  Glucose, Bld 70 - 99 mg/dL 113 High  95 CM 103 High  CM 119 High  CM 103 High  CM 110 High  CM 96  Comment: Glucose reference range applies only to samples taken after fasting for at least 8 hours.  BUN 8 - 23 mg/dL 32 High  31 High  38 High  '12 17 16 16 '$ R  Creatinine 0.61 - 1.24 mg/dL 0.67 0.64 0.60 Low  0.63 0.71 0.72 0.66 Low  R  Calcium 8.9 - 10.3 mg/dL 9.2 8.7 Low  8.6 Low  8.6 Low  8.6 Low  8.8 Low  9.4 R  Total Protein 6.5 - 8.1 g/dL 6.7 6.8 6.5      Albumin 3.5 - 5.0 g/dL 4.2 4.2 4.1      AST 15 - 41 U/L '21 18 14 '$ Low       ALT 0 - 44 U/L '19 18 12          '$ ALLERGIES: Allergies  Allergen Reactions   Nifedipine Swelling   Amlodipine Besylate Swelling and Other (See Comments)    Ankles swell    HOME MEDICATIONS: Outpatient Medications Prior to Visit  Medication Sig Dispense Refill   alfuzosin (UROXATRAL) 10 MG 24 hr tablet Take 10 mg by mouth at bedtime.     ascorbic acid (VITAMIN C) 1000 MG tablet Take 1,000 mg by mouth daily.     atorvastatin (LIPITOR) 40 MG tablet Take 40 mg by mouth at bedtime.     b complex vitamins capsule Take 1 capsule by mouth daily.     calcium carbonate (OSCAL) 1500 (600 Ca) MG TABS tablet 2 tablets once daily     carvedilol (COREG) 6.25 MG tablet Take 1 tablet (6.25 mg total) by mouth 2 (two) times daily. 180 tablet 3   Cholecalciferol 50 MCG (2000 UT) CAPS Take 2,000 Units by mouth daily.     ferrous sulfate 325 (65 FE) MG tablet Take 325 mg by mouth daily  with breakfast.     Multiple Vitamins-Iron (MULTIVITAMIN/IRON PO) Take 1 tablet by mouth daily with breakfast.     Romosozumab-aqqg (EVENITY [place]) Inject 1 mL into the skin every 30 (thirty) days.     telmisartan (MICARDIS) 80 MG tablet Take 80 mg by mouth daily.     UNABLE TO FIND Take 15 mg by mouth daily. Med Name: [name]     No facility-administered medications prior to visit.    PAST MEDICAL HISTORY: Past Medical History:  Diagnosis Date   Anemia    Hypertension    Prostate cancer ([place])    Sleep apnea    uses cpap    TBI (traumatic brain injury) ([place]) 2015    PAST SURGICAL HISTORY: Past Surgical History:  Procedure Laterality Date   BACK SURGERY     BASAL CELL CARCINOMA EXCISION     BILATERAL CARPAL TUNNEL RELEASE  2014   CYSTOSCOPY N/A 06/29/2019   Procedure: CYSTOSCOPY;  Surgeon: Alexis Frock, MD;  Location: [facility];  Service: Urology;  Laterality: N/A;  No seeds in bladder per Dr. Lilli Light LAMINECTOMY/DECOMPRESSION MICRODISCECTOMY Left 05/28/2021   Procedure: Left  - Lumbar five-Sacral one Laminectomy resection of synovial cyst;  Surgeon: Earnie Larsson, MD;  Location: [facility];  Service: Neurosurgery;  Laterality: Left;   PROSTATE BIOPSY     RADIOACTIVE SEED IMPLANT N/A 06/29/2019   Procedure: RADIOACTIVE SEED IMPLANT/BRACHYTHERAPY  IMPLANT;  Surgeon: Alexis Frock, MD;  Location: [facility];  Service: Urology;  Laterality: N/A;  Day N/A 06/29/2019   Procedure: SPACE OAR INSTILLATION;  Surgeon: Alexis Frock, MD;  Location: [facility];  Service: Urology;  Laterality: N/A;    FAMILY HISTORY: Family History  Problem Relation Age of Onset   Brain cancer Father        GBM    SOCIAL HISTORY: Social History   Socioeconomic History   Marital status: Married    Spouse name: Not on file   Number of children: 1   Years of education: Not on file   Highest education level: Not on file   Occupational History   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: retired  Tobacco Use   Smoking status: Never   Smokeless tobacco: Never  Vaping Use   Vaping Use: Never used  Substance and Sexual Activity   Alcohol use: Yes    Alcohol/week: 1.0 - 2.0 standard drink of alcohol    Types: 1 - 2 Glasses of wine per week    Comment: Wine/Dinner   Drug use: No   Sexual activity: Yes    Comment: with aid of sildenafil  Other Topics Concern   Not on file  Social History Narrative   ** Merged History Encounter **       Married. 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 Preferred name: Rod. Enjoys cycling.   Social Determinants of Health   Financial Resource Strain: Not on file  Food Insecurity: Not on file  Transportation Needs: Not on file  Physical Activity: Not on file  Stress: Not on file  Social Connections: Not on file  Intimate Partner Violence: Not on file      PHYSICAL EXAM  Vitals:   12/09/22 0846  BP: (!) 151/70  Pulse: 60  Weight: 160 lb (72.6 kg)  Height: '5\' 7"'$  (1.702 m)   Body mass index is 25.06 kg/m. Generalized: Well developed, in no acute distress  Chest: Lungs clear to auscultation bilaterally. Facial lesions form basalioma removal, one on he nose _ CPAP mask interferes. And one high left parietal scalp.  Neurological examination  Mentation: Alert oriented to time, place, history taking. Follows all commands speech and language fluent.    07/02/2022    9:21 AM 01/05/2020    3:14 PM 07/04/2019    1:03 PM  Montreal Cognitive Assessment   Visuospatial/ Executive (0/5) '4 5 5  '$ Naming (0/3) '3 3 3  '$ Attention: Read list of digits (0/2) '2 2 1  '$ Attention: Read list of letters (0/1) '1 1 1  '$ Attention: Serial 7 subtraction starting at 100 (0/3) '1 1 3  '$ Language: Repeat phrase (0/2) '2 2 2  '$ Language : Fluency (0/1) 0 1 1  Abstraction (0/2) '2 2 2  '$ Delayed Recall (0/5) 0 3 1  Orientation (0/6) '4 6 5  '$ Total '19 26 24  '$ Adjusted Score  (based on education) 19 26         No data to display          Cranial nerve : no loss of smell or taste, he presents with right facial droop involving forehead, eye cannot be completely closed, his right mouth is drooping.    Extraocular movements were full, visual field were full on confrontational test Head turning and shoulder shrug were with normal ROM, symmetric. He has brisk hyperreflexia.   Sensory:  inatct to vibration and fine touch in hands and feet.   Motor : good hip flexion and knee extension, no pain radiating down his leg, brisk patella DTR bilaterally.  Gait and station: he uses a walker, wears a brace.  Gait is stooped posture, his stride is shorter, he drifts to the right. Right sided foot drop since 2015.    DIAGNOSTIC DATA (LABS, IMAGING, TESTING) - I reviewed patient records, labs, notes, testing and imaging myself where available. CPAP download.   I have reviewed his [facility] records, [facility], nephrologist , osteoporosis clinic.   ASSESSMENT AND PLAN 77 y.o. year old male  has a past medical history of Anemia, Hypertension, Prostate cancer ([place]), Sleep apnea, and TBI (traumatic brain injury) ([place]) (2015). Postoperative infection, central apnea emerging under CPAP.  Bells palsy.    I spent 32 minutes of face-to-face and non-face-to-face time with patient and spouse . I reviewed the records from ED, PCP and agree fully with dx of bells palsy.     This included previsit chart review, lab review, study review, order entry, electronic health record documentation, patient education.  Larey Seat, MD   12/09/2022, 9:19 AM [facility] [address] [place], [place] [address] [phone]

## 2022-12-24 ENCOUNTER — Other Ambulatory Visit: Payer: Self-pay | Admitting: Cardiology

## 2022-12-31 ENCOUNTER — Ambulatory Visit: Payer: Medicare Other | Attending: Nurse Practitioner

## 2022-12-31 DIAGNOSIS — R4701 Aphasia: Secondary | ICD-10-CM | POA: Diagnosis present

## 2022-12-31 DIAGNOSIS — R41841 Cognitive communication deficit: Secondary | ICD-10-CM | POA: Insufficient documentation
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
## 2023-01-07 ENCOUNTER — Ambulatory Visit (INDEPENDENT_AMBULATORY_CARE_PROVIDER_SITE_OTHER): Payer: Medicare Other | Admitting: Adult Health

## 2023-01-07 ENCOUNTER — Encounter: Payer: Self-pay | Admitting: Adult Health

## 2023-01-07 VITALS — BP 120/80 | HR 52 | Ht 67.0 in | Wt 160.2 lb

## 2023-01-07 DIAGNOSIS — G510 Bell's palsy: Secondary | ICD-10-CM

## 2023-01-07 NOTE — Progress Notes (Signed)
PATIENT: Cody Gonzalez DOB: 10/28/1946  REASON FOR VISIT: follow up HISTORY FROM: patient PRIMARY NEUROLOGIST:   Chief Complaint  Patient presents with   Follow-up    Pt in 19 with wife  Pt here for Bells palsy f/u  Pt states still tapes right eye down while sleeping       HISTORY OF PRESENT ILLNESS: Today 01/07/23  Cody Gonzalez is a 77 y.o. male who has been followed in this office for Bell's palsy. Returns today for follow-up.  He was seen 1 month ago by Dr. Brett Fairy.  His wife reports a 95% improvement in his symptoms.  Patient reports that he can now close his eyes all the way.  He still been using an eye patch.  He also has artificial tears.  He returns today for an evaluation.  HISTORY Rv 12-09-2022: Cody Gonzalez, a patient with many back surgeries, a TBI and progressive memory problems presents after  had a Bells palsy on 11-26-2022, was seen at PCP and ER and given a dry eye protection. He is still in phase of recovery. Deltasone and valtrex were given too.  REVIEW OF SYSTEMS: Out of a complete 14 system review of symptoms, the patient complains only of the following symptoms, and all other reviewed systems are negative.  ALLERGIES: Allergies  Allergen Reactions   Nifedipine Swelling   Amlodipine Besylate Swelling and Other (See Comments)    Ankles swell    HOME MEDICATIONS: Outpatient Medications Prior to Visit  Medication Sig Dispense Refill   alfuzosin (UROXATRAL) 10 MG 24 hr tablet Take 10 mg by mouth at bedtime.     ascorbic acid (VITAMIN C) 1000 MG tablet Take 1,000 mg by mouth daily.     atorvastatin (LIPITOR) 40 MG tablet Take 40 mg by mouth at bedtime.     b complex vitamins capsule Take 1 capsule by mouth daily.     calcium carbonate (OSCAL) 1500 (600 Ca) MG TABS tablet 2 tablets once daily     carvedilol (COREG) 6.25 MG tablet Take 1 tablet (6.25 mg total) by mouth 2 (two) times daily. 180 tablet 3   Cholecalciferol 50 MCG (2000 UT)  CAPS Take 2,000 Units by mouth daily.     ferrous sulfate 325 (65 FE) MG tablet Take 325 mg by mouth daily with breakfast.     Multiple Vitamins-Iron (MULTIVITAMIN/IRON PO) Take 1 tablet by mouth daily with breakfast.     Romosozumab-aqqg (EVENITY [place]) Inject 1 mL into the skin every 30 (thirty) days.     telmisartan (MICARDIS) 80 MG tablet TAKE ONE TABLET BY MOUTH DAILY 90 tablet 1   UNABLE TO FIND Take 15 mg by mouth daily. Med Name: Lynett Grimes Petrolatum-Mineral Oil (ARTIFICIAL TEARS) 83-15 % OINT Use over night. 3.5 g 1   No facility-administered medications prior to visit.    PAST MEDICAL HISTORY: Past Medical History:  Diagnosis Date   Anemia    Hypertension    Prostate cancer ([place])    Sleep apnea    uses cpap    TBI (traumatic brain injury) ([place]) 2015    PAST SURGICAL HISTORY: Past Surgical History:  Procedure Laterality Date   BACK SURGERY     BASAL CELL CARCINOMA EXCISION     BILATERAL CARPAL TUNNEL RELEASE  2014   CYSTOSCOPY N/A 06/29/2019   Procedure: CYSTOSCOPY;  Surgeon: Alexis Frock, MD;  Location[facility];  Service: Urology;  Laterality: N/A;  No seeds  in bladder per Dr. Lilli Light LAMINECTOMY/DECOMPRESSION MICRODISCECTOMY Left 05/28/2021   Procedure: Left  - Lumbar five-Sacral one Laminectomy resection of synovial cyst;  Surgeon: Earnie Larsson, MD;  Location: [facility];  Service: Neurosurgery;  Laterality: Left;   PROSTATE BIOPSY     RADIOACTIVE SEED IMPLANT N/A 06/29/2019   Procedure: RADIOACTIVE SEED IMPLANT/BRACHYTHERAPY IMPLANT;  Surgeon: Alexis Frock, MD;  Location: [facility];  Service: Urology;  Laterality: N/A;  [place] N/A 06/29/2019   Procedure: SPACE OAR INSTILLATION;  Surgeon: Alexis Frock, MD;  Location: [facility];  Service: Urology;  Laterality: N/A;    FAMILY HISTORY: Family History  Problem Relation Age of Onset   Brain cancer Father        GBM   Bell's  palsy Neg Hx     SOCIAL HISTORY: Social History   Socioeconomic History   Marital status: Married    Spouse name: Not on file   Number of children: 1   Years of education: Not on file   Highest education level: Not on file  Occupational History   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: retired  Tobacco Use   Smoking status: Never   Smokeless tobacco: Never  Vaping Use   Vaping Use: Never used  Substance and Sexual Activity   Alcohol use: Yes    Alcohol/week: 14.0 standard drinks of alcohol    Types: 14 Glasses of wine per week    Comment: Wine/Dinner   Drug use: No   Sexual activity: Yes    Comment: with aid of sildenafil  Other Topics Concern   Not on file  Social History Narrative   ** Merged History Encounter **       Married. 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 Preferred name: Rod. Enjoys cycling.   Social Determinants of Health   Financial Resource Strain: Not on file  Food Insecurity: Not on file  Transportation Needs: Not on file  Physical Activity: Not on file  Stress: Not on file  Social Connections: Not on file  Intimate Partner Violence: Not on file      PHYSICAL EXAM  Vitals:   01/07/23 1116  BP: 120/80  Pulse: (!) 52  Weight: 160 lb 3.2 oz (72.7 kg)  Height: '5\' 7"'$  (1.702 m)   Body mass index is 25.09 kg/m.  Generalized: Well developed, in no acute distress   Neurological examination  Mentation: Alert oriented to time, place, history taking. Follows all commands speech and language fluent Cranial nerve II-XII: Pupils were equal round reactive to light. Extraocular movements were full, visual field were full on confrontational test. Facial sensation and strength were normal. Uvula tongue midline. Head turning and shoulder shrug  were normal and symmetric.  Patient able to close both eyes.  Right eye has less than 1 mm opening. Motor: The motor testing reveals 5 over 5 strength of all 4 extremities. Good  symmetric motor tone is noted throughout.  Sensory: Sensory testing is intact to soft touch on all 4 extremities. No evidence of extinction is noted.  Coordination: Cerebellar testing reveals good finger-nose-finger and heel-to-shin bilaterally.  Gait and station: Gait is normal.  Reflexes: Deep tendon reflexes are symmetric and normal bilaterally.   DIAGNOSTIC DATA (LABS, IMAGING, TESTING) - I reviewed patient records, labs, notes, testing and imaging myself where available.  Lab Results  Component Value Date   WBC 3.0 (L) 10/13/2022  HGB 12.5 (L) 10/13/2022   HCT 35.6 (L) 10/13/2022   MCV 96.5 10/13/2022   PLT 197 10/13/2022      Component Value Date/Time   NA 130 (L) 10/13/2022 0943   NA 128 (L) 02/04/2022 0919   K 4.6 10/13/2022 0943   CL 95 (L) 10/13/2022 0943   CO2 31 10/13/2022 0943   GLUCOSE 113 (H) 10/13/2022 0943   BUN 32 (H) 10/13/2022 0943   BUN 16 02/04/2022 0919   CREATININE 0.67 10/13/2022 0943   CALCIUM 9.2 10/13/2022 0943   PROT 6.7 10/13/2022 0943   ALBUMIN 4.2 10/13/2022 0943   AST 21 10/13/2022 0943   ALT 19 10/13/2022 0943   ALKPHOS 113 10/13/2022 0943   BILITOT 1.2 10/13/2022 0943   GFRNONAA >60 10/13/2022 0943   GFRAA >60 06/27/2019 1209      ASSESSMENT AND PLAN 77 y.o. year old male  has a past medical history of Anemia, Hypertension, Prostate cancer ([place]), Sleep apnea, and TBI (traumatic brain injury) ([place]) (2015). here with:  1.  Bell's palsy  Patient's eye closure has improved.  Advised that he can use the artificial tears as needed.  Patient would like to stop taping his eye shut. Advised that he can give this a try however if he experiences dry eyes or any irritation he may need to resume.  His wife will also check on him while he is asleep to make sure that he has complete eye closure.  On exam today there was less than 1 mm opening in the right eye.  Advised if symptoms worsen or he develops new symptoms he should let us know  Follow-up  in 6 to 7 months for CPAP and memory     Ward Givens, MSN, NP-C 01/07/2023, 11:29 AM [facility] [address] [place], [place] [address] [phone]

## 2023-01-07 NOTE — Patient Instructions (Signed)
Your Plan:  Continue using artificial tears as needed.     Thank you for coming to see [place] at [facility]. I hope we have been able to provide you high quality care today.  You may receive a patient satisfaction survey over the next few weeks. We would appreciate your feedback and comments so that we may continue to improve ourselves and the health of our patients.

## 2023-01-28 ENCOUNTER — Ambulatory Visit: Payer: Medicare Other | Attending: Nurse Practitioner

## 2023-01-28 DIAGNOSIS — R41841 Cognitive communication deficit: Secondary | ICD-10-CM | POA: Insufficient documentation

## 2023-01-28 DIAGNOSIS — R4701 Aphasia: Secondary | ICD-10-CM | POA: Diagnosis present
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
## 2023-02-11 ENCOUNTER — Encounter (HOSPITAL_BASED_OUTPATIENT_CLINIC_OR_DEPARTMENT_OTHER): Payer: Self-pay | Admitting: Emergency Medicine

## 2023-02-11 ENCOUNTER — Emergency Department (HOSPITAL_BASED_OUTPATIENT_CLINIC_OR_DEPARTMENT_OTHER)
Admission: EM | Admit: 2023-02-11 | Discharge: 2023-02-11 | Disposition: A | Discharge status: Home / Self Care | Payer: Medicare Other | Attending: Emergency Medicine | Admitting: Emergency Medicine

## 2023-02-11 ENCOUNTER — Other Ambulatory Visit: Payer: Self-pay

## 2023-02-11 ENCOUNTER — Emergency Department (HOSPITAL_BASED_OUTPATIENT_CLINIC_OR_DEPARTMENT_OTHER): Payer: Medicare Other | Admitting: Radiology

## 2023-02-11 DIAGNOSIS — Y92003 Bedroom of unspecified non-institutional (private) residence as the place of occurrence of the external cause: Secondary | ICD-10-CM | POA: Insufficient documentation

## 2023-02-11 DIAGNOSIS — M25511 Pain in right shoulder: Secondary | ICD-10-CM | POA: Diagnosis present

## 2023-02-11 DIAGNOSIS — S40011A Contusion of right shoulder, initial encounter: Secondary | ICD-10-CM | POA: Diagnosis not present

## 2023-02-11 DIAGNOSIS — S4991XA Unspecified injury of right shoulder and upper arm, initial encounter: Secondary | ICD-10-CM

## 2023-02-11 DIAGNOSIS — W06XXXA Fall from bed, initial encounter: Secondary | ICD-10-CM | POA: Diagnosis not present

## 2023-02-11 NOTE — ED Provider Notes (Signed)
[place] EMERGENCY DEPARTMENT AT [facility]  Provider Note  CSN: [id number] Arrival date & time: 02/11/23 2037  History Chief Complaint  Patient presents with   Cody Gonzalez is a 77 y.o. male with history of chronic back problems reports he fell while getting into bed last night. Has had R posterior shoulder pain since then, worse with movement. He has also since developed a large bruise over his anterior chest but that is not the site of his pain. No head injury. Does not take blood thinners.    Home Medications Prior to Admission medications   Medication Sig Start Date End Date Taking? Authorizing Provider  alfuzosin (UROXATRAL) 10 MG 24 hr tablet Take 10 mg by mouth at bedtime.    [provider]  ascorbic acid (VITAMIN C) 1000 MG tablet Take 1,000 mg by mouth daily.    [provider]  atorvastatin (LIPITOR) 40 MG tablet Take 40 mg by mouth at bedtime. 09/01/19   [provider]  b complex vitamins capsule Take 1 capsule by mouth daily.    [provider]  calcium carbonate (OSCAL) 1500 (600 Ca) MG TABS tablet 2 tablets once daily    [provider]  carvedilol (COREG) 6.25 MG tablet Take 1 tablet (6.25 mg total) by mouth 2 (two) times daily. 10/08/22   Tolia, Sunit, DO  Cholecalciferol 50 MCG (2000 UT) CAPS Take 2,000 Units by mouth daily.    [provider]  ferrous sulfate 325 (65 FE) MG tablet Take 325 mg by mouth daily with breakfast.    [provider]  Multiple Vitamins-Iron (MULTIVITAMIN/IRON PO) Take 1 tablet by mouth daily with breakfast.    [provider]  Romosozumab-aqqg (EVENITY Jennette) Inject 1 mL into the skin every 30 (thirty) days.    [provider]  telmisartan (MICARDIS) 80 MG tablet TAKE ONE TABLET BY MOUTH DAILY 12/24/22   Tolia, Sunit, DO  UNABLE TO FIND Take 15 mg by mouth daily. Med Name: Mickle Mallory    [provider]  White Petrolatum-Mineral  Oil (ARTIFICIAL TEARS) 83-15 % OINT Use over night. 12/09/22   Dohmeier, Porfirio Mylar, MD     Allergies    Nifedipine and Amlodipine besylate   Review of Systems   Review of Systems Please see HPI for pertinent positives and negatives  Physical Exam BP (!) 132/58   Pulse 61   Temp 98 F (36.7 C)   Resp 18   SpO2 95%   Physical Exam Vitals and nursing note reviewed.  HENT:     Head: Normocephalic.     Nose: Nose normal.  Eyes:     Extraocular Movements: Extraocular movements intact.  Cardiovascular:     Pulses: Normal pulses.  Pulmonary:     Effort: Pulmonary effort is normal.  Musculoskeletal:     Cervical back: Neck supple.     Comments: No focal bony tenderness to R shoulder but soft tissue tenderness posteriorly. Large anterior bruise is not tender. Pain with ROM of shoulder.   Skin:    Findings: No rash (on exposed skin).  Neurological:     Mental Status: He is alert and oriented to person, place, and time.  Psychiatric:        Mood and Affect: Mood normal.     ED Results / Procedures / Treatments   EKG None  Procedures Procedures  Medications Ordered in the ED Medications - No data to display  Initial Impression and Plan  Patient with R shoulder injury yesterday, here with large bruise and soft tissue tenderness. I personally viewed the images from radiology studies and agree with radiologist interpretation: xray neg for fracture. Suspect a soft tissue/ligament injury. Plan sling for comfort. Patient declines narcotic pain medications, will take APAP. Refer to Ortho for re-evaluation.    ED Course       MDM Rules/Calculators/A&P Medical Decision Making Problems Addressed: Injury of right shoulder, initial encounter: acute illness or injury  Amount and/or Complexity of Data Reviewed Radiology: ordered and independent interpretation performed. Decision-making details documented in ED Course.  Risk OTC drugs.     Final Clinical Impression(s) / ED  Diagnoses Final diagnoses:  Injury of right shoulder, initial encounter    Rx / DC Orders ED Discharge Orders     None        Pollyann SavoySheldon, Alexis Mizuno B, MD 02/11/23 [id number]

## 2023-02-11 NOTE — ED Triage Notes (Signed)
Fall last night. Lost his balance getting into bed.  Ems called last night to eval and assist from ground.   Right shoulder continued pain into back no relief with ice, lido patch and tylenol. Ambulatory to triage with cane accompanied  by wife

## 2023-02-13 ENCOUNTER — Telehealth: Payer: Self-pay

## 2023-02-13 NOTE — Telephone Encounter (Signed)
        Patient  visited [facility] on 02/11/2023  for fall, injury of right shoulder.   Telephone encounter attempt :  1st  A HIPAA compliant voice message was left requesting a return call.  Instructed patient to call back at [phone].   Fard Borunda [facility]  [facility] Population Health Community Resource Care Guide   [email or web address]  ?? [phone]   Website: [email or web address]  [email or web address]

## 2023-02-16 ENCOUNTER — Telehealth: Payer: Self-pay

## 2023-02-16 ENCOUNTER — Encounter: Payer: Self-pay | Admitting: Podiatry

## 2023-02-16 ENCOUNTER — Ambulatory Visit (INDEPENDENT_AMBULATORY_CARE_PROVIDER_SITE_OTHER): Payer: Medicare Other | Admitting: Podiatry

## 2023-02-16 ENCOUNTER — Ambulatory Visit (INDEPENDENT_AMBULATORY_CARE_PROVIDER_SITE_OTHER): Payer: Medicare Other

## 2023-02-16 DIAGNOSIS — M7752 Other enthesopathy of left foot: Secondary | ICD-10-CM

## 2023-02-16 DIAGNOSIS — M79674 Pain in right toe(s): Secondary | ICD-10-CM | POA: Diagnosis not present

## 2023-02-16 DIAGNOSIS — B351 Tinea unguium: Secondary | ICD-10-CM

## 2023-02-16 DIAGNOSIS — M79675 Pain in left toe(s): Secondary | ICD-10-CM | POA: Diagnosis not present

## 2023-02-16 NOTE — Telephone Encounter (Signed)
     Patient  visit on 02/11/2023  at [facility] was for injury of right shoulder.  Have you been able to follow up with your primary care physician? Patient has appointment with Orthopedic Surgeon.  The patient was or was not able to obtain any needed medicine or equipment. No medication prescribed.  Are there diet recommendations that you are having difficulty following? No  Patient expresses understanding of discharge instructions and education provided has no other needs at this time. Yes   Cody Gonzalez [facility]  [facility] Population Health Community Resource Care Guide   [email or web address]  ?? [phone]   Website: [email or web address]  [email or web address]

## 2023-02-16 NOTE — Progress Notes (Signed)
Chief Complaint  Patient presents with   Follow-up    RFC with pain and swelling in left foot x 1 week.    HPI: 77 y.o. male presenting today for evaluation of increased swelling with tenderness to the left foot and ankle.  Onset about 1 week ago.  He did sustain a fall injury about a week ago.  He has stability and balance issues and is going to physical therapy routinely.  Presenting for further treatment and evaluation  Past Medical History:  Diagnosis Date   Anemia    Hypertension    Prostate cancer    Sleep apnea    uses cpap    TBI (traumatic brain injury) 2015    Past Surgical History:  Procedure Laterality Date   BACK SURGERY     BASAL CELL CARCINOMA EXCISION     BILATERAL CARPAL TUNNEL RELEASE  2014   CYSTOSCOPY N/A 06/29/2019   Procedure: CYSTOSCOPY;  Surgeon: Sebastian Ache, MD;  Location: [facility];  Service: Urology;  Laterality: N/A;  No seeds in bladder per Dr. Drue Flirt LAMINECTOMY/DECOMPRESSION MICRODISCECTOMY Left 05/28/2021   Procedure: Left  - Lumbar five-Sacral one Laminectomy resection of synovial cyst;  Surgeon: Julio Sicks, MD;  Location: [facility] OR;  Service: Neurosurgery;  Laterality: Left;   PROSTATE BIOPSY     RADIOACTIVE SEED IMPLANT N/A 06/29/2019   Procedure: RADIOACTIVE SEED IMPLANT/BRACHYTHERAPY IMPLANT;  Surgeon: Sebastian Ache, MD;  Location: [facility];  Service: Urology;  Laterality: N/A;  90 MINS   SPACE OAR INSTILLATION N/A 06/29/2019   Procedure: SPACE OAR INSTILLATION;  Surgeon: Sebastian Ache, MD;  Location: [facility];  Service: Urology;  Laterality: N/A;    Allergies  Allergen Reactions   Nifedipine Swelling   Amlodipine Besylate Swelling and Other (See Comments)    Ankles swell     Physical Exam: General: The patient is alert and oriented x3 in no acute distress.  Dermatology: Skin is warm, dry and supple bilateral lower extremities.   Vascular: Palpable pedal pulses  bilaterally. Capillary refill within normal limits.  No appreciable edema.  No erythema.  Neurological: Grossly intact via light touch  Musculoskeletal Exam: No pedal deformities noted  Radiographic Exam LT foot and ankle 02/16/2023:  Normal osseous mineralization.  There is some irregularity of the navicular joint.  Possible acute fracture vs. chronic healed fracture vs. advanced severe DJD of the TN joint.  No other osseous irregularities noted.  There is some soft tissue edema around the ankle joint  Assessment/Plan of Care: 1.  Edema with increased pain left ankle joint 2.  Possible acute fracture navicular left  -Patient evaluated.  X-rays reviewed -Compression ankle sleeve dispensed.  Wear daily -Short cam boot dispensed.  WBAT.  Advised the patient and spouse that if the cam boot is more detrimental to the patient's balance and he is at higher risk of falls I would rather him just wear good supportive tennis shoes -Return to clinic 4 weeks       Felecia Shelling, DPM [facility]  Dr. Felecia Shelling, DPM    [address] [place], [place] [address]  Office [phone]  Fax [phone]

## 2023-02-26 NOTE — Progress Notes (Signed)
Averages January 2021 summary Average Systolic BP Level 136.33 mmHg Lowest Systolic BP Level 117 mmHg Highest Systolic BP Level 157 mmHg  Feb 2024 Systolic Blood Pressure 131.7 (98.0 - 153.0) Diastolic Blood Pressure 76.5 (56.2 - 97.0) Heart Rate 59.3 (55.0 - 63.0)  Mar 2024 Systolic Blood Pressure 132.5 (98.0 - 153.0) Diastolic Blood Pressure 76.9 (13.0 - 97.0) Heart Rate  59.3 (53.0 - 67.0)  April 2024 SBP 139.9 (122.0 - 162.0) DBP 81.7 (74.0 - 91.0) HR 61.0 (53.0 - 69.0)  Date Systolic Diastolic Units Heart Rate Units 12/04/22 8:32 PM 98 56 mmHg 60 bpm 12/06/22 8:47 PM 120 68 mmHg 63 bpm 12/07/22 9:07 PM 141 85 mmHg 61 bpm 12/08/22 8:42 PM 132 76 mmHg 62 bpm 12/09/22 8:31 PM 141 80 mmHg 57 bpm 12/10/22 8:58 PM 132 82 mmHg 60 bpm 12/11/22 8:32 PM 130 77 mmHg 56 bpm 12/12/22 8:31 PM 121 67 mmHg 63 bpm 12/13/22 9:01 PM 134 73 mmHg 63 bpm 12/14/22 8:34 PM 127 73 mmHg 60 bpm 12/16/22 9:19 PM 144 81 mmHg 55 bpm 12/17/22 8:32 PM 126 74 mmHg 56 bpm 12/18/22 9:00 PM 137 75 mmHg 58 bpm 12/19/22 9:00 PM 134 82 mmHg 57 bpm 12/22/22 9:51 PM 153 97 mmHg 55 bpm 12/24/22 8:25 PM 127 76 mmHg 59 bpm 12/25/22 8:31 PM 138 80 mmHg 60 bpm 12/29/22 8:35 PM 135 76 mmHg 62 bpm 12/30/22 8:33 PM 133 75 mmHg 60 bpm 12/31/22 8:31 PM 130 74 mmHg 61 bpm 01/01/23 8:31 PM 134 78 mmHg 61 bpm 01/02/23 8:32 PM 131 78 mmHg 60 bpm 01/04/23 8:27 PM 135 80 mmHg 58 bpm 01/05/23 8:41 PM 140 85 mmHg 58 bpm 01/06/23 8:02 PM 128 73 mmHg 58 bpm 01/07/23 8:32 PM 130 80 mmHg 58 bpm 01/08/23 9:03 PM 127 71 mmHg 61 bpm 01/09/23 8:40 PM 133 78 mmHg 59 bpm 01/11/23 7:31 PM 132 75 mmHg 63 bpm 01/12/23 8:32 PM 134 71 mmHg 60 bpm 01/13/23 8:31 PM 147 89 mmHg 57 bpm 01/14/23 8:37 PM 147 91 mmHg 53 bpm 01/15/23 8:31 PM 125 70 mmHg 59 bpm 01/16/23 8:31 PM 135 72 mmHg 60 bpm 01/17/23 8:40 PM 130 73 mmHg 67 bpm 01/18/23 8:31 PM 141 81 mmHg 56 bpm 01/19/23 8:33 PM 145 84 mmHg 54 bpm 01/20/23 8:31 PM 130 81 mmHg 60 bpm 01/21/23 8:32 PM 114 65 mmHg 65 bpm 01/22/23 8:31  PM 144 87 mmHg 53 bpm 01/23/23 8:31 PM 127 72 mmHg 62 bpm 01/24/23 8:32 PM 132 75 mmHg 59 bpm 01/25/23 8:42 PM 125 75 mmHg 56 bpm 01/26/23 8:31 PM 128 73 mmHg 60 bpm 01/27/23 8:09 PM 136 77 mmHg 64 bpm 01/28/23 8:29 PM 142 82 mmHg 53 bpm 01/29/23 8:52 PM 130 79 mmHg 57 bpm 01/30/23 8:31 PM 137 80 mmHg 61 bpm 02/02/23 9:10 PM 143 81 mmHg 56 bpm 02/03/23 8:19 PM 142 86 mmHg 60 bpm 02/04/23 8:26 PM 136 84 mmHg 60 bpm 02/05/23 8:32 PM 136 82 mmHg 61 bpm 02/06/23 8:30 PM 144 91 mmHg 58 bpm 02/07/23 8:32 PM 132 78 mmHg 55 bpm 02/08/23 8:31 PM 126 75 mmHg 60 bpm 02/09/23 8:31 PM 131 75 mmHg 56 bpm 02/10/23 8:32 PM 122 74 mmHg 61 bpm 02/12/23 8:34 PM 162 86 mmHg 63 bpm 02/13/23 8:29 PM 140 84 mmHg 62 bpm 02/14/23 8:31 PM 135 80 mmHg 62 bpm 02/15/23 8:31 PM 146 85 mmHg 65 bpm 02/16/23 8:24 PM 153 88 mmHg 68 bpm 02/17/23 8:28 PM 148 84 mmHg 65 bpm  02/18/23 8:25 PM 139 89 mmHg 63 bpm 02/20/23 8:23 PM 147 85 mmHg 63 bpm 02/22/23 8:25 PM 150 79 mmHg 58 bpm 02/23/23 8:31 PM 138 83 mmHg 59 bpm 02/24/23 8:41 PM 138 81 mmHg 69 bpm 02/25/23 8:31 PM 144 75 mmHg 65 bpm

## 2023-03-16 ENCOUNTER — Ambulatory Visit (INDEPENDENT_AMBULATORY_CARE_PROVIDER_SITE_OTHER): Payer: Medicare Other | Admitting: Podiatry

## 2023-03-16 DIAGNOSIS — M7752 Other enthesopathy of left foot: Secondary | ICD-10-CM | POA: Diagnosis not present

## 2023-03-16 NOTE — Progress Notes (Signed)
Chief Complaint  Patient presents with   Foot Pain    Patient came in today for left foot ankle pain follow-up, patient denies any pain, patient wore the cam boot for 2 weeks and now is wearing good supportive tennis shoes.    HPI: 77 y.o. male presenting today for follow-up evaluation of left foot and ankle pain.  Patient and spouse state that he Mehling or has any pain or tenderness associated to the left foot or ankle.  He was able to wear the cam boot for about 2 weeks.  This helped tremendously they state.  He also continues to wear the compression sleeve daily.  Past Medical History:  Diagnosis Date   Anemia    Hypertension    Prostate cancer (HCC)    Sleep apnea    uses cpap    TBI (traumatic brain injury) (HCC) 2015    Past Surgical History:  Procedure Laterality Date   BACK SURGERY     BASAL CELL CARCINOMA EXCISION     BILATERAL CARPAL TUNNEL RELEASE  2014   CYSTOSCOPY N/A 06/29/2019   Procedure: CYSTOSCOPY;  Surgeon: Sebastian Ache, MD;  [facility];  Service: Urology;  Laterality: N/A;  No seeds in bladder per Dr. Drue Flirt LAMINECTOMY/DECOMPRESSION MICRODISCECTOMY Left 05/28/2021   Procedure: Left  - Lumbar five-Sacral one Laminectomy resection of synovial cyst;  Surgeon: Julio Sicks, MD;  Location: [facility] OR;  Service: Neurosurgery;  Laterality: Left;   PROSTATE BIOPSY     RADIOACTIVE SEED IMPLANT N/A 06/29/2019   Procedure: RADIOACTIVE SEED IMPLANT/BRACHYTHERAPY IMPLANT;  Surgeon: Sebastian Ache, MD;  Location: [facility];  Service: Urology;  Laterality: N/A;  90 MINS   SPACE OAR INSTILLATION N/A 06/29/2019   Procedure: SPACE OAR INSTILLATION;  Surgeon: Sebastian Ache, MD;  Location: [facility];  Service: Urology;  Laterality: N/A;    Allergies  Allergen Reactions   Nifedipine Swelling   Amlodipine Besylate Swelling and Other (See Comments)    Ankles swell     Physical Exam: General: The patient  is alert and oriented x3 in no acute distress.  Dermatology: Skin is warm, dry and supple bilateral lower extremities.   Vascular: Palpable pedal pulses bilaterally. Capillary refill within normal limits.  No appreciable edema.  No erythema.  Neurological: Grossly intact via light touch  Musculoskeletal Exam: No pedal deformities noted  Radiographic Exam LT foot and ankle 02/16/2023:  Normal osseous mineralization.  There is some irregularity of the navicular joint.  Possible acute fracture vs. chronic healed fracture vs. advanced severe DJD of the TN joint.  No other osseous irregularities noted.  There is some soft tissue edema around the ankle joint  Assessment/Plan of Care: 1.  Edema with increased pain left ankle joint 2.  Possible acute fracture navicular left  -Patient evaluated.  -Patient no longer wearing the cam boot.  He is wearing good supportive tennis shoes without any pain or tenderness.  Continue. -Continue compression ankle sleeve daily as needed -Return to clinic as needed      Felecia Shelling, DPM [facility]  Dr. Felecia Shelling, DPM    [address], [place] [address]  Office [phone]  Fax [phone]

## 2023-04-08 ENCOUNTER — Other Ambulatory Visit: Payer: Self-pay

## 2023-04-08 ENCOUNTER — Emergency Department (HOSPITAL_BASED_OUTPATIENT_CLINIC_OR_DEPARTMENT_OTHER): Payer: Medicare Other

## 2023-04-08 ENCOUNTER — Emergency Department (HOSPITAL_BASED_OUTPATIENT_CLINIC_OR_DEPARTMENT_OTHER)
Admission: EM | Admit: 2023-04-08 | Discharge: 2023-04-08 | Disposition: A | Discharge status: Home / Self Care | Payer: Medicare Other | Attending: Emergency Medicine | Admitting: Emergency Medicine

## 2023-04-08 ENCOUNTER — Encounter (HOSPITAL_BASED_OUTPATIENT_CLINIC_OR_DEPARTMENT_OTHER): Payer: Self-pay | Admitting: Emergency Medicine

## 2023-04-08 DIAGNOSIS — R030 Elevated blood-pressure reading, without diagnosis of hypertension: Secondary | ICD-10-CM | POA: Diagnosis not present

## 2023-04-08 DIAGNOSIS — W19XXXA Unspecified fall, initial encounter: Secondary | ICD-10-CM

## 2023-04-08 DIAGNOSIS — W01198A Fall on same level from slipping, tripping and stumbling with subsequent striking against other object, initial encounter: Secondary | ICD-10-CM | POA: Diagnosis not present

## 2023-04-08 DIAGNOSIS — Y9301 Activity, walking, marching and hiking: Secondary | ICD-10-CM | POA: Diagnosis not present

## 2023-04-08 DIAGNOSIS — S59901A Unspecified injury of right elbow, initial encounter: Secondary | ICD-10-CM | POA: Diagnosis present

## 2023-04-08 DIAGNOSIS — Y92481 Parking lot as the place of occurrence of the external cause: Secondary | ICD-10-CM | POA: Diagnosis not present

## 2023-04-08 DIAGNOSIS — S50311A Abrasion of right elbow, initial encounter: Secondary | ICD-10-CM | POA: Diagnosis not present

## 2023-04-08 DIAGNOSIS — S0990XA Unspecified injury of head, initial encounter: Secondary | ICD-10-CM | POA: Insufficient documentation

## 2023-04-08 NOTE — Discharge Instructions (Addendum)
Follow up outpatient  Take your nighttime medications when you get home  Return for new or worsening symptoms

## 2023-04-08 NOTE — ED Triage Notes (Signed)
Fall, lost balance. Hit head on side of car and hit elbow. Denies pain. Happened around 6pm Denies loc, no blood thinners

## 2023-04-08 NOTE — ED Provider Notes (Signed)
[place] EMERGENCY DEPARTMENT AT [facility] Provider Note   CSN: [id number] Arrival date & time: 04/08/23  1815    History  Chief Complaint  Patient presents with   Cody Gonzalez is a 77 y.o. male significant for TB, mild cognitive impairment here for evaluation mechanical fall.  Wife states they were walking in a parking lot when he tripped over his left foot.  Hit right side of head and elbow on another car.  He denies any LOC, anticoagulation.  He has been ambulatory since.  He is at his baseline mentation per his wife.  He denies any headache, nausea, vomiting, neck pain, back pain, chest pain, shortness of breath, abdominal pain.  No pain or swelling to lower or upper extremities. Abrasion right elbow Tetanus up to date   HPI     Home Medications Prior to Admission medications   Medication Sig Start Date End Date Taking? Authorizing Provider  alfuzosin (UROXATRAL) 10 MG 24 hr tablet Take 10 mg by mouth at bedtime.    [provider]  ascorbic acid (VITAMIN C) 1000 MG tablet Take 1,000 mg by mouth daily.    [provider]  atorvastatin (LIPITOR) 40 MG tablet Take 40 mg by mouth at bedtime. 09/01/19   [provider]  b complex vitamins capsule Take 1 capsule by mouth daily.    [provider]  calcium carbonate (OSCAL) 1500 (600 Ca) MG TABS tablet 2 tablets once daily    [provider]  carvedilol (COREG) 6.25 MG tablet Take 1 tablet (6.25 mg total) by mouth 2 (two) times daily. 10/08/22   Tolia, Sunit, DO  Cholecalciferol 50 MCG (2000 UT) CAPS Take 2,000 Units by mouth daily.    [provider]  ferrous sulfate 325 (65 FE) MG tablet Take 325 mg by mouth daily with breakfast.    [provider]  Multiple Vitamins-Iron (MULTIVITAMIN/IRON PO) Take 1 tablet by mouth daily with breakfast.    [provider]  Romosozumab-aqqg (EVENITY [place]) Inject 1 mL into the skin every 30 (thirty)  days.    [provider]  telmisartan (MICARDIS) 80 MG tablet TAKE ONE TABLET BY MOUTH DAILY 12/24/22   Tolia, Sunit, DO  UNABLE TO FIND Take 15 mg by mouth daily. Med Name: Mickle Mallory    [provider]  White Petrolatum-Mineral Oil (ARTIFICIAL TEARS) 83-15 % OINT Use over night. 12/09/22   Dohmeier, Porfirio Mylar, MD      Allergies    Nifedipine and Amlodipine besylate    Review of Systems   Review of Systems  HENT: Negative.    Respiratory: Negative.    Cardiovascular: Negative.   Gastrointestinal: Negative.   Genitourinary: Negative.   Musculoskeletal: Negative.   Skin:  Positive for wound. Negative for color change and rash.       Abrasion right elbow  Neurological: Negative.   All other systems reviewed and are negative.  Physical Exam Updated Vital Signs BP (!) 191/79 (BP Location: Right Arm)   Pulse (!) 55   Temp 97.8 F (36.6 C)   Resp 18   SpO2 99%  Physical Exam Vitals and nursing note reviewed.  Constitutional:      General: He is not in acute distress.    Appearance: He is well-developed. He is not ill-appearing or diaphoretic.  HENT:     Head: Atraumatic.  Eyes:     Pupils: Pupils are equal, round, and reactive to light.  Cardiovascular:  Rate and Rhythm: Normal rate and regular rhythm.     Pulses: Normal pulses.          Radial pulses are 2+ on the right side and 2+ on the left side.       Dorsalis pedis pulses are 2+ on the right side and 2+ on the left side.     Heart sounds: Normal heart sounds.  Pulmonary:     Effort: Pulmonary effort is normal. No respiratory distress.     Breath sounds: Normal breath sounds.  Abdominal:     General: Bowel sounds are normal. There is no distension.     Palpations: Abdomen is soft.  Musculoskeletal:        General: Signs of injury present. No swelling or deformity. Normal range of motion.     Cervical back: Normal range of motion and neck supple.     Right lower leg: No edema.     Left lower leg: No  edema.     Comments: Abrasion right elbow, nontender, full range of motion No midline C/T/L tenderness  Skin:    General: Skin is warm and dry.     Capillary Refill: Capillary refill takes less than 2 seconds.  Neurological:     General: No focal deficit present.     Mental Status: He is alert. Mental status is at baseline.     ED Results / Procedures / Treatments   Labs (all labs ordered are listed, but only abnormal results are displayed) Labs Reviewed - No data to display  EKG None  Radiology CT Cervical Spine Wo Contrast  Result Date: 04/08/2023 CLINICAL DATA:  Trauma, fall EXAM: CT CERVICAL SPINE WITHOUT CONTRAST TECHNIQUE: Multidetector CT imaging of the cervical spine was performed without intravenous contrast. Multiplanar CT image reconstructions were also generated. RADIATION DOSE REDUCTION: This exam was performed according to the departmental dose-optimization program which includes automated exposure control, adjustment of the mA and/or kV according to patient size and/or use of iterative reconstruction technique. COMPARISON:  None Available. FINDINGS: Alignment: Alignment of posterior margins of vertebral bodies is within normal limits. Skull base and vertebrae: No recent fracture is seen. There is anterior surgical fusion at C4-C5 and C5-C6 levels. There is 8 mm low-density in the left posterior aspect of body C7 vertebra without break in the cortical margins. This finding has not changed in comparison with the earlier CT thoracic spine done on 04/02/2022 suggesting benign process such as a cyst. Soft tissues and spinal canal: There is no central spinal stenosis. Disc levels: There is encroachment of neural foramina from C3 to C7 levels. Upper chest: No acute findings are seen. Other: Coarse calcifications are seen in carotid arteries in both sides of neck. IMPRESSION: No recent fracture is seen in cervical spine. Previous anterior fusion at C4-C5 and C5-C6 levels. Cervical  spondylosis with encroachment of neural foramina at multiple levels, more so at C3-C4 level. Electronically Signed   By: Ernie Avena M.D.   On: 04/08/2023 19:29   CT Head Wo Contrast  Result Date: 04/08/2023 CLINICAL DATA:  Trauma, fall EXAM: CT HEAD WITHOUT CONTRAST TECHNIQUE: Contiguous axial images were obtained from the base of the skull through the vertex without intravenous contrast. RADIATION DOSE REDUCTION: This exam was performed according to the departmental dose-optimization program which includes automated exposure control, adjustment of the mA and/or kV according to patient size and/or use of iterative reconstruction technique. COMPARISON:  11/27/2022 FINDINGS: Brain: No acute intracranial findings are seen. There is prominence  of third and both lateral ventricles. Cortical sulci are prominent. There is decreased density in periventricular and subcortical white matter. Vascular: Scattered arterial calcifications are seen. Skull: No acute findings are seen. Sinuses/Orbits: There is mild mucosal thickening in ethmoid sinus. Other: None. IMPRESSION: No acute intracranial findings are seen. Atrophy. Small vessel disease. Mild chronic ethmoid sinusitis. Electronically Signed   By: Ernie Avena M.D.   On: 04/08/2023 19:19   DG Elbow Complete Right  Result Date: 04/08/2023 CLINICAL DATA:  Fall with trauma to the elbow EXAM: RIGHT ELBOW - COMPLETE 4 VIEW COMPARISON:  None Available. FINDINGS: There is no evidence of fracture, dislocation, or joint effusion. There is no evidence of arthropathy or other focal bone abnormality. Soft tissues are unremarkable. IMPRESSION: No acute fracture or dislocation. Electronically Signed   By: Agustin Cree M.D.   On: 04/08/2023 19:05    Procedures Procedures    Medications Ordered in ED Medications - No data to display  ED Course/ Medical Decision Making/ A&P   77 year old here for evaluation after mechanical fall.  Fall from tripping in a parking  lot prior to arrival.  He denies any complaints. He hit the car next to hit when he fell. No midline C/T/L tenderness.  Denies any headache, anticoagulation.  No chest pain abdominal pain.  No syncope, anticoagulation.  Abrasion right elbow, tetanus up-to-date per family  Imaging personally viewed and interpreted:  CT head, cervical without significant abnormality X-ray right elbow without significant abnormality  Patient reassessed.  Discussed labs with patient, family in room.  He is currently asymptomatic, ambulatory.  Will DC home with symptomatic management.  Of note he is hypertensive here.  He has not taken his home dose of his blood pressure medication.  No chest pain, headache, vision changes, nausea, vomiting abdominal pain, numbness or weakness.  Low suspicion for hypertensive urgency or emergency.  Offered home blood pressure medication here which patient declined.  States he will take at home.  I encouraged this as well as close monitoring of his symptoms.  Will have him return for new or worsening symptoms.  The patient has been appropriately medically screened and/or stabilized in the ED. I have low suspicion for any other emergent medical condition which would require further screening, evaluation or treatment in the ED or require inpatient management.  Patient is hemodynamically stable and in no acute distress.  Patient able to ambulate in department prior to ED.  Evaluation does not show acute pathology that would require ongoing or additional emergent interventions while in the emergency department or further inpatient treatment.  I have discussed the diagnosis with the patient and answered all questions.  Pain is been managed while in the emergency department and patient has no further complaints prior to discharge.  Patient is comfortable with plan discussed in room and is stable for discharge at this time.  I have discussed strict return precautions for returning to the emergency  department.  Patient was encouraged to follow-up with PCP/specialist refer to at discharge.                             Medical Decision Making Amount and/or Complexity of Data Reviewed Independent Historian: spouse External Data Reviewed: labs, radiology and notes. Radiology: ordered and independent interpretation performed. Decision-making details documented in ED Course.  Risk OTC drugs. Decision regarding hospitalization. Diagnosis or treatment significantly limited by social determinants of health.  Final Clinical Impression(s) / ED Diagnoses Final diagnoses:  Fall, initial encounter  Elevated blood pressure reading    Rx / DC Orders ED Discharge Orders     None         Ankita Newcomer A, PA-C 04/08/23 2250    Lonell Grandchild, MD 04/09/23 1232

## 2023-04-13 ENCOUNTER — Telehealth: Payer: Self-pay

## 2023-04-13 NOTE — Telephone Encounter (Signed)
Transition Care Management Follow-up Telephone Call Date of discharge and from where: [facility] How have you been since you were released from the hospital? Doing fine  Any questions or concerns? No  Items Reviewed: Did the pt receive and understand the discharge instructions provided? Yes  Medications obtained and verified? Yes  Other? No  Any new allergies since your discharge? No  Dietary orders reviewed? No Do you have support at home? Yes     Follow up appointments reviewed:  PCP Hospital f/u appt confirmed? No  Scheduled to see  on  @ . Specialist Hospital f/u appt confirmed? No  Scheduled to see  on  @ . Are transportation arrangements needed? No  If their condition worsens, is the pt aware to call PCP or go to the Emergency Dept.? Yes  Was the patient provided with contact information for the PCP's office or ED? Yes Was to pt encouraged to call back with questions or concerns? Yes

## 2023-05-18 ENCOUNTER — Ambulatory Visit: Payer: Medicare Other | Admitting: Podiatry

## 2023-05-18 ENCOUNTER — Encounter: Payer: Self-pay | Admitting: Podiatry

## 2023-05-18 DIAGNOSIS — M79674 Pain in right toe(s): Secondary | ICD-10-CM

## 2023-05-18 DIAGNOSIS — B351 Tinea unguium: Secondary | ICD-10-CM | POA: Diagnosis not present

## 2023-05-18 DIAGNOSIS — M79675 Pain in left toe(s): Secondary | ICD-10-CM

## 2023-05-18 NOTE — Progress Notes (Signed)
This patient returns to the office for evaluation and treatment of long thick painful nails .  This patient is unable to trim his own nails since the patient cannot reach his feet.  Patient says the nails are painful walking and wearing his shoes.  He returns for preventive foot care services. ? ?General Appearance  Alert, conversant and in no acute stress. ? ?Vascular  Dorsalis pedis and posterior tibial  pulses are palpable  bilaterally.  Capillary return is within normal limits  bilaterally. Temperature is within normal limits  bilaterally. ? ?Neurologic  Senn-Weinstein monofilament wire test within normal limits  bilaterally. Muscle power within normal limits bilaterally. ? ?Nails Thick disfigured discolored nails with subungual debris  hallux nails bilaterally. No evidence of bacterial infection or drainage bilaterally. ? ?Orthopedic  No limitations of motion  feet .  No crepitus or effusions noted.  No bony pathology or digital deformities noted. ? ?Skin  normotropic skin with no porokeratosis noted bilaterally.  No signs of infections or ulcers noted.    ? ?Onychomycosis  Pain in toes right foot  Pain in toes left foot ? ?Debridement  of nails  1-5  B/L with a nail nipper.  Nails were then filed using a dremel tool with no incidents.    RTC  4 months  ? ? ?Gardiner Barefoot DPM   ?

## 2023-06-19 ENCOUNTER — Other Ambulatory Visit: Payer: Self-pay | Admitting: Cardiology

## 2023-07-14 ENCOUNTER — Ambulatory Visit
Admission: RE | Admit: 2023-07-14 | Discharge: 2023-07-14 | Disposition: A | Discharge status: Home / Self Care | Payer: Medicare Other | Source: Ambulatory Visit | Attending: Physician Assistant | Admitting: Physician Assistant

## 2023-07-14 DIAGNOSIS — M810 Age-related osteoporosis without current pathological fracture: Secondary | ICD-10-CM

## 2023-07-17 ENCOUNTER — Other Ambulatory Visit (HOSPITAL_BASED_OUTPATIENT_CLINIC_OR_DEPARTMENT_OTHER): Payer: Self-pay

## 2023-07-17 MED ORDER — COVID-19 MRNA VAC-TRIS(PFIZER) 30 MCG/0.3ML IM SUSY
0.3000 mL | PREFILLED_SYRINGE | Freq: Once | INTRAMUSCULAR | 0 refills | Status: AC
  Filled 2023-07-17: qty 0.3, 1d supply, fill #0

## 2023-07-27 ENCOUNTER — Ambulatory Visit: Payer: Medicare Other

## 2023-07-27 ENCOUNTER — Ambulatory Visit (INDEPENDENT_AMBULATORY_CARE_PROVIDER_SITE_OTHER): Payer: Medicare Other

## 2023-07-27 ENCOUNTER — Ambulatory Visit (INDEPENDENT_AMBULATORY_CARE_PROVIDER_SITE_OTHER): Payer: Medicare Other | Admitting: Podiatry

## 2023-07-27 DIAGNOSIS — M778 Other enthesopathies, not elsewhere classified: Secondary | ICD-10-CM

## 2023-07-27 DIAGNOSIS — R52 Pain, unspecified: Secondary | ICD-10-CM | POA: Diagnosis not present

## 2023-07-27 DIAGNOSIS — M7752 Other enthesopathy of left foot: Secondary | ICD-10-CM | POA: Diagnosis not present

## 2023-07-27 DIAGNOSIS — M2042 Other hammer toe(s) (acquired), left foot: Secondary | ICD-10-CM | POA: Diagnosis not present

## 2023-07-27 MED ORDER — BETAMETHASONE SOD PHOS & ACET 6 (3-3) MG/ML IJ SUSP
3.0000 mg | Freq: Once | INTRAMUSCULAR | Status: AC
Start: 2023-07-27 — End: 2023-07-27
  Administered 2023-07-27: 3 mg via INTRA_ARTICULAR

## 2023-07-27 MED ORDER — MELOXICAM 15 MG PO TABS: 15.0000 mg | ORAL_TABLET | Freq: Every day | ORAL | 1 refills | Status: DC

## 2023-07-27 NOTE — Progress Notes (Signed)
Chief Complaint  Patient presents with   Foot Pain    C/o achy pain to the ball of left foot x 2 weeks. No known injuries. He does walk a mile everyday. He does not wear any inserts. Not diabetic.     HPI: 77 y.o. male presenting today for new complaint of pain and tenderness associated to the ball of the left foot ongoing for about a month now.  Patient has increased his walking which she says helps.  Idiopathic onset.  Denies a history of injury.  He has not really done anything for treatment.  Past Medical History:  Diagnosis Date   Anemia    Hypertension    Prostate cancer (HCC)    Sleep apnea    uses cpap    TBI (traumatic brain injury) (HCC) 2015    Past Surgical History:  Procedure Laterality Date   BACK SURGERY     BASAL CELL CARCINOMA EXCISION     BILATERAL CARPAL TUNNEL RELEASE  2014   CYSTOSCOPY N/A 06/29/2019   Procedure: CYSTOSCOPY;  Surgeon: Sebastian Ache, MD;  Location: [facility];  Service: Urology;  Laterality: N/A;  No seeds in bladder per Dr. Drue Flirt LAMINECTOMY/DECOMPRESSION MICRODISCECTOMY Left 05/28/2021   Procedure: Left  - Lumbar five-Sacral one Laminectomy resection of synovial cyst;  Surgeon: Julio Sicks, MD;  Location: [facility] OR;  Service: Neurosurgery;  Laterality: Left;   PROSTATE BIOPSY     RADIOACTIVE SEED IMPLANT N/A 06/29/2019   Procedure: RADIOACTIVE SEED IMPLANT/BRACHYTHERAPY IMPLANT;  Surgeon: Sebastian Ache, MD;  Location: [facility];  Service: Urology;  Laterality: N/A;  90 MINS   SPACE OAR INSTILLATION N/A 06/29/2019   Procedure: SPACE OAR INSTILLATION;  Surgeon: Sebastian Ache, MD;  Location: [facility];  Service: Urology;  Laterality: N/A;    Allergies  Allergen Reactions   Nifedipine Swelling   Amlodipine Besylate Swelling and Other (See Comments)    Ankles swell     Physical Exam: General: The patient is alert and oriented x3 in no acute distress.  Dermatology: Skin is  warm, dry and supple bilateral lower extremities.   Vascular: Palpable pedal pulses bilaterally. Capillary refill within normal limits.  No appreciable edema.  No erythema.  Neurological: Grossly intact via light touch  Musculoskeletal Exam: Hammertoe contracture deformity noted to digits 2-5 bilateral.  There is pain with palpation and range of motion to the fourth MTP of the left foot.  Radiographic Exam LT foot 07/27/2023:  Normal osseous mineralization.  There is some degenerative changes noted to the talonavicular joint of the left foot.  Clinically this is asymptomatic.  No acute fractures identified.  Hammertoe contracture noted to the lesser digits.  Joint spaces to the MTPs preserved.  Assessment/Plan of Care: 1.  Fourth MTP capsulitis left 2.  Hammertoe contracture lesser digits bilateral  -Patient evaluated.  X-rays reviewed -Injection of 0.5 cc Celestone Soluspan injected around the fourth MTP left -Prescription for meloxicam 15 mg daily -Recommend good supportive shoes and sneakers.  Patient may continue walking for exercise -Return to clinic next scheduled appointment for routine footcare       Felecia Shelling, DPM [facility]  Dr. Felecia Shelling, DPM    [address]  [place], [place] [address]                Office [phone]  Fax [phone]

## 2023-08-03 ENCOUNTER — Other Ambulatory Visit (HOSPITAL_BASED_OUTPATIENT_CLINIC_OR_DEPARTMENT_OTHER): Payer: Self-pay

## 2023-08-03 MED ORDER — INFLUENZA VAC A&B SURF ANT ADJ 0.5 ML IM SUSY
0.5000 mL | PREFILLED_SYRINGE | Freq: Once | INTRAMUSCULAR | 0 refills | Status: AC
  Filled 2023-08-03: qty 0.5, 1d supply, fill #0

## 2023-08-10 NOTE — Progress Notes (Unsigned)
PATIENT: Cody Gonzalez DOB: 31-Mar-1946  REASON FOR VISIT: follow up HISTORY FROM: patient PRIMARY NEUROLOGIST: Dr. Vickey Huger  Chief Complaint  Patient presents with   Follow-up    Pt in 20  with wife  Pt here for CPAP f/u  Pt states no questions or concerns for todays visit      HISTORY OF PRESENT ILLNESS: Today 08/10/23  Cody Gonzalez is a 77 y.o. male who has been followed in this office for OSA on CPAP.  His download indicates that he uses machine nightly for compliance of 100%.  He uses machine on average 7 hours and 51 minutes.  His residual AHI is 7.8 on 6 to 15 cm of water with an EPR of 3.  He reports that the CPAP is working well.  He denies any new issues.  Patient is seeing orthopedist at [facility].  Has had 3 back surgeries in the last 2 years.  He reports today that he was out walking 2 days ago and he states he suddenly had a hard time moving his legs.  Denies any pain.  Denies any numbness or tingling down the legs.  He states that it felt like he had to think more about moving his legs.  He was able to walk back to his car.  States that he went home was able to make himself lunch and rested.  He gait is back to baseline.  Has not had any additional symptoms.  He reports that his gait is always slightly unsteady since he has had back surgeries.  Often tripping over the left toe.  He uses a cane when ambulating unless it is for short distances.  Does physical therapy once a week.      REVIEW OF SYSTEMS: Out of a complete 14 system review of symptoms, the patient complains only of the following symptoms, and all other reviewed systems are negative.  ALLERGIES: Allergies  Allergen Reactions   Nifedipine Swelling   Amlodipine Besylate Swelling and Other (See Comments)    Ankles swell    HOME MEDICATIONS: Outpatient Medications Prior to Visit  Medication Sig Dispense Refill   alfuzosin (UROXATRAL) 10 MG 24 hr tablet Take 10 mg by mouth at bedtime.      ascorbic acid (VITAMIN C) 1000 MG tablet Take 1,000 mg by mouth daily.     atorvastatin (LIPITOR) 40 MG tablet Take 40 mg by mouth at bedtime.     b complex vitamins capsule Take 1 capsule by mouth daily.     calcium carbonate (OSCAL) 1500 (600 Ca) MG TABS tablet 2 tablets once daily     carvedilol (COREG) 6.25 MG tablet Take 1 tablet (6.25 mg total) by mouth 2 (two) times daily. 180 tablet 3   Cholecalciferol 50 MCG (2000 UT) CAPS Take 2,000 Units by mouth daily.     ferrous sulfate 325 (65 FE) MG tablet Take 325 mg by mouth daily with breakfast.     meloxicam (MOBIC) 15 MG tablet Take 1 tablet (15 mg total) by mouth daily. 30 tablet 1   Multiple Vitamins-Iron (MULTIVITAMIN/IRON PO) Take 1 tablet by mouth daily with breakfast.     Romosozumab-aqqg (EVENITY [place]) Inject 1 mL into the skin every 30 (thirty) days.     telmisartan (MICARDIS) 80 MG tablet TAKE 1 TABLET BY MOUTH DAILY 90 tablet 1   UNABLE TO FIND Take 15 mg by mouth daily. Med Name: Alric Ran Petrolatum-Mineral Oil (ARTIFICIAL TEARS) 83-15 %  OINT Use over night. 3.5 g 1   No facility-administered medications prior to visit.    PAST MEDICAL HISTORY: Past Medical History:  Diagnosis Date   Anemia    Hypertension    Prostate cancer (HCC)    Sleep apnea    uses cpap    TBI (traumatic brain injury) (HCC) 2015    PAST SURGICAL HISTORY: Past Surgical History:  Procedure Laterality Date   BACK SURGERY     BASAL CELL CARCINOMA EXCISION     BILATERAL CARPAL TUNNEL RELEASE  2014   CYSTOSCOPY N/A 06/29/2019   Procedure: CYSTOSCOPY;  Surgeon: Sebastian Ache, MD;  Location: [facility];  Service: Urology;  Laterality: N/A;  No seeds in bladder per Dr. Drue Flirt LAMINECTOMY/DECOMPRESSION MICRODISCECTOMY Left 05/28/2021   Procedure: Left  - Lumbar five-Sacral one Laminectomy resection of synovial cyst;  Surgeon: Julio Sicks, MD;  Location: [facility] OR;  Service: Neurosurgery;  Laterality: Left;   PROSTATE BIOPSY      RADIOACTIVE SEED IMPLANT N/A 06/29/2019   Procedure: RADIOACTIVE SEED IMPLANT/BRACHYTHERAPY IMPLANT;  Surgeon: Sebastian Ache, MD;  Location: [facility];  Service: Urology;  Laterality: N/A;  90 MINS   SPACE OAR INSTILLATION N/A 06/29/2019   Procedure: SPACE OAR INSTILLATION;  Surgeon: Sebastian Ache, MD;  Location: [facility];  Service: Urology;  Laterality: N/A;    FAMILY HISTORY: Family History  Problem Relation Age of Onset   Brain cancer Father        GBM   Bell's palsy Neg Hx     SOCIAL HISTORY: Social History   Socioeconomic History   Marital status: Married    Spouse name: Not on file   Number of children: 1   Years of education: Not on file   Highest education level: Not on file  Occupational History   Occupation: Charity fundraiser    Comment: retired  Tobacco Use   Smoking status: Never   Smokeless tobacco: Never  Vaping Use   Vaping status: Never Used  Substance and Sexual Activity   Alcohol use: Yes    Alcohol/week: 14.0 standard drinks of alcohol    Types: 14 Glasses of wine per week    Comment: Wine/Dinner   Drug use: No   Sexual activity: Yes    Comment: with aid of sildenafil  Other Topics Concern   Not on file  Social History Narrative   ** Merged History Encounter **       Married. 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 Preferred name: Rod. Enjoys cycling.   Social Determinants of Health   Financial Resource Strain: Low Risk  (06/18/2023)   Received from [facility]   Overall Financial Resource Strain (CARDIA)    Difficulty of Paying Living Expenses: Not hard at all  Food Insecurity: No Food Insecurity (06/18/2023)   Received from [facility]   Hunger Vital Sign    Worried About Running Out of Food in the Last Year: Never true    Ran Out of Food in the Last Year: Never true  Transportation Needs: No Transportation Needs (06/18/2023)    Received from [facility] - Transportation    In the past 12 months, has lack of transportation kept you from medical appointments or from getting medications?: No    Lack of Transportation (Non-Medical): No  Physical Activity: Not on file  Stress: Not on file  Social Connections:  Not on file  Intimate Partner Violence: Not on file      PHYSICAL EXAM  Vitals:   08/12/23 1045  BP: (!) 167/70  Pulse: (!) 51  Weight: 144 lb 3.2 oz (65.4 kg)  Height: 5\' 4"  (1.626 m)   Body mass index is 24.75 kg/m.  Generalized: Well developed, in no acute distress   Neurological examination  Mentation: Alert oriented to time, place, history taking. Follows all commands speech and language fluent Cranial nerve II-XII: Pupils were equal round reactive to light. Extraocular movements were full, visual field were full on confrontational test. Facial sensation and strength were normal. Uvula tongue midline. Head turning and shoulder shrug  were normal and symmetric. Motor: The motor testing reveals 5 over 5 strength of all 4 extremities. Good symmetric motor tone is noted throughout.  Sensory: Sensory testing is intact to soft touch on all 4 extremities. No evidence of extinction is noted.  Coordination: Cerebellar testing reveals good finger-nose-finger and heel-to-shin bilaterally.  Gait and station: Gait is slightly unsteady.  Uses a cane when ambulating.  2 times  his left foot caught on the floor.   DIAGNOSTIC DATA (LABS, IMAGING, TESTING) - I reviewed patient records, labs, notes, testing and imaging myself where available.  Lab Results  Component Value Date   WBC 3.0 (L) 10/13/2022   HGB 12.5 (L) 10/13/2022   HCT 35.6 (L) 10/13/2022   MCV 96.5 10/13/2022   PLT 197 10/13/2022      Component Value Date/Time   NA 130 (L) 10/13/2022 0943   NA 128 (L) 02/04/2022 0919   K 4.6 10/13/2022 0943   CL 95 (L) 10/13/2022 0943   CO2 31 10/13/2022 0943   GLUCOSE 113  (H) 10/13/2022 0943   BUN 32 (H) 10/13/2022 0943   BUN 16 02/04/2022 0919   CREATININE 0.67 10/13/2022 0943   CALCIUM 9.2 10/13/2022 0943   PROT 6.7 10/13/2022 0943   ALBUMIN 4.2 10/13/2022 0943   AST 21 10/13/2022 0943   ALT 19 10/13/2022 0943   ALKPHOS 113 10/13/2022 0943   BILITOT 1.2 10/13/2022 0943   GFRNONAA >60 10/13/2022 0943   GFRAA >60 06/27/2019 1209      ASSESSMENT AND PLAN 77 y.o. year old male  has a past medical history of Anemia, Hypertension, Prostate cancer (HCC), Sleep apnea, and TBI (traumatic brain injury) (HCC) (2015). here with:  1.  Obstructive sleep apnea on CPAP  -Good compliance -Good treatment of apnea -Encouraged the patient to use CPAP nightly and greater than 4 hours each night  2.  Abnormality of gait  -Patient is back to his baseline.  I encouraged him to reach out to his orthopedist and make them aware of this episode.  - encouraged him to continue with PT - Did advise that if he wanted me to ordering imaging- I would. However he deferred and said he would monitor for now.   FU in 1 year for CPAP     Butch Penny, MSN, NP-C 08/10/2023, 2:39 PM [facility] [address], [address] [place], [place] [address] [phone]

## 2023-08-12 ENCOUNTER — Encounter: Payer: Self-pay | Admitting: Adult Health

## 2023-08-12 ENCOUNTER — Ambulatory Visit (INDEPENDENT_AMBULATORY_CARE_PROVIDER_SITE_OTHER): Payer: Medicare Other | Admitting: Adult Health

## 2023-08-12 VITALS — BP 167/70 | HR 51 | Ht 64.0 in | Wt 144.2 lb

## 2023-08-12 DIAGNOSIS — G4733 Obstructive sleep apnea (adult) (pediatric): Secondary | ICD-10-CM

## 2023-08-12 DIAGNOSIS — R269 Unspecified abnormalities of gait and mobility: Secondary | ICD-10-CM

## 2023-08-21 ENCOUNTER — Ambulatory Visit: Payer: Self-pay | Admitting: Cardiology

## 2023-09-03 ENCOUNTER — Emergency Department (HOSPITAL_BASED_OUTPATIENT_CLINIC_OR_DEPARTMENT_OTHER)
Admission: EM | Admit: 2023-09-03 | Discharge: 2023-09-03 | Disposition: A | Discharge status: Home / Self Care | Payer: Medicare Other | Attending: Emergency Medicine | Admitting: Emergency Medicine

## 2023-09-03 ENCOUNTER — Emergency Department (HOSPITAL_BASED_OUTPATIENT_CLINIC_OR_DEPARTMENT_OTHER): Payer: Medicare Other

## 2023-09-03 ENCOUNTER — Other Ambulatory Visit: Payer: Self-pay

## 2023-09-03 DIAGNOSIS — F0390 Unspecified dementia without behavioral disturbance: Secondary | ICD-10-CM | POA: Diagnosis not present

## 2023-09-03 DIAGNOSIS — S80211A Abrasion, right knee, initial encounter: Secondary | ICD-10-CM | POA: Diagnosis not present

## 2023-09-03 DIAGNOSIS — I10 Essential (primary) hypertension: Secondary | ICD-10-CM | POA: Insufficient documentation

## 2023-09-03 DIAGNOSIS — S59911A Unspecified injury of right forearm, initial encounter: Secondary | ICD-10-CM | POA: Diagnosis present

## 2023-09-03 DIAGNOSIS — W19XXXA Unspecified fall, initial encounter: Secondary | ICD-10-CM

## 2023-09-03 DIAGNOSIS — S0001XA Abrasion of scalp, initial encounter: Secondary | ICD-10-CM | POA: Diagnosis not present

## 2023-09-03 DIAGNOSIS — W109XXA Fall (on) (from) unspecified stairs and steps, initial encounter: Secondary | ICD-10-CM | POA: Insufficient documentation

## 2023-09-03 DIAGNOSIS — S51811A Laceration without foreign body of right forearm, initial encounter: Secondary | ICD-10-CM | POA: Diagnosis not present

## 2023-09-03 NOTE — ED Triage Notes (Signed)
Fell up steps. No thinners. Struck head. Recent alzheimer's diagnosis. Laceration to right side of head-hemorrhage controlled on arrival. Abrasions to right hand. Walks with cane. No LOC. Denies neck, back pain. Teeth intact, tongue free on injury. Full ROM in UE and LE. Pupils equal 4mm reactive to light.

## 2023-09-03 NOTE — Discharge Instructions (Addendum)
You were seen in the emergency department after your fall.  You had some skin tears and an abrasion to your head but no broken bones or internal bleeding.  You can take Tylenol or Motrin as needed for pain.  You can wash your wounds with soap and water and I recommend dressing wound with nonstick dressings to help your wounds heal.  You can follow-up with your primary doctor in the next few days to have your symptoms and wounds rechecked.  You should return to the emergency department if you are having severe headaches, repetitive vomiting, numbness or weakness in one-sided body compared to the other or any other new or concerning symptoms.

## 2023-09-03 NOTE — ED Triage Notes (Addendum)
Charted in error.

## 2023-09-03 NOTE — ED Provider Notes (Signed)
[place] EMERGENCY DEPARTMENT AT [facility] Provider Note   CSN: [id number] Arrival date & time: 09/03/23  2055     History  Chief Complaint  Patient presents with   Cody Gonzalez is a 77 y.o. male.  Patient is a 77 year old male with a past medical history of dementia hypertension presenting to the emergency department after a fall.  Patient states that he was walking inside after giving out candy for trigger treaters when he tripped on the steps and fell forward hitting his head.  He denies any loss of consciousness.  His wife states that he initially went to the bathroom to clean himself up however she saw that he was bleeding and recommended that he come to the emergency department.  He denies any headache or any other pain, nausea or vomiting, numbness or weakness.  He denies any blood thinner use.  The history is provided by the patient and the spouse.  Fall       Home Medications Prior to Admission medications   Medication Sig Start Date End Date Taking? Authorizing Provider  alfuzosin (UROXATRAL) 10 MG 24 hr tablet Take 10 mg by mouth at bedtime.    [provider]  ascorbic acid (VITAMIN C) 1000 MG tablet Take 1,000 mg by mouth daily.    [provider]  atorvastatin (LIPITOR) 40 MG tablet Take 40 mg by mouth at bedtime. 09/01/19   [provider]  b complex vitamins capsule Take 1 capsule by mouth daily.    [provider]  calcium carbonate (OSCAL) 1500 (600 Ca) MG TABS tablet 2 tablets once daily    [provider]  carvedilol (COREG) 6.25 MG tablet Take 1 tablet (6.25 mg total) by mouth 2 (two) times daily. 10/08/22   Tolia, Sunit, DO  Cholecalciferol 50 MCG (2000 UT) CAPS Take 2,000 Units by mouth daily.    [provider]  ferrous sulfate 325 (65 FE) MG tablet Take 325 mg by mouth daily with breakfast.    [provider]  meloxicam (MOBIC) 15 MG tablet Take 1 tablet (15 mg  total) by mouth daily. 07/27/23   Felecia Shelling, DPM  Multiple Vitamins-Iron (MULTIVITAMIN/IRON PO) Take 1 tablet by mouth daily with breakfast.    [provider]  Romosozumab-aqqg (EVENITY [place]) Inject 1 mL into the skin every 30 (thirty) days.    [provider]  telmisartan (MICARDIS) 80 MG tablet TAKE 1 TABLET BY MOUTH DAILY 06/19/23   Tolia, Sunit, DO  UNABLE TO FIND Take 15 mg by mouth daily. Med Name: Mickle Mallory    [provider]  White Petrolatum-Mineral Oil (ARTIFICIAL TEARS) 83-15 % OINT Use over night. 12/09/22   Dohmeier, Porfirio Mylar, MD      Allergies    Nifedipine and Amlodipine besylate    Review of Systems   Review of Systems  Physical Exam Updated Vital Signs BP (!) 180/78   Pulse 61   Temp 98.5 F (36.9 C) (Oral)   Resp 16   Wt 65.8 kg   SpO2 100%   BMI 24.89 kg/m  Physical Exam Vitals and nursing note reviewed.  Constitutional:      General: He is not in acute distress.    Appearance: Normal appearance.  HENT:     Head: Normocephalic.     Comments: Nonbleeding abrasion to right scalp with small hematoma    Nose: Nose normal.     Mouth/Throat:     Mouth: Mucous membranes  are moist.     Pharynx: Oropharynx is clear.  Eyes:     Extraocular Movements: Extraocular movements intact.     Conjunctiva/sclera: Conjunctivae normal.     Pupils: Pupils are equal, round, and reactive to light.  Neck:     Comments: No midline neck tenderness Cardiovascular:     Rate and Rhythm: Normal rate and regular rhythm.     Heart sounds: Normal heart sounds.  Pulmonary:     Effort: Pulmonary effort is normal.     Breath sounds: Normal breath sounds.  Abdominal:     General: Abdomen is flat.     Palpations: Abdomen is soft.     Tenderness: There is no abdominal tenderness.  Musculoskeletal:        General: Normal range of motion.     Cervical back: Normal range of motion and neck supple.     Comments: No midline back tenderness No bony tenderness of  bilateral upper or lower extremities  Skin:    General: Skin is warm and dry.     Comments: Multiple skin tears to right forearm, mildly oozing, no lacerations Nonbleeding abrasion to right knee  Neurological:     General: No focal deficit present.     Mental Status: He is alert and oriented to person, place, and time.     Cranial Nerves: No cranial nerve deficit.     Sensory: No sensory deficit.     Motor: No weakness.     Gait: Gait normal.  Psychiatric:        Mood and Affect: Mood normal.        Behavior: Behavior normal.     ED Results / Procedures / Treatments   Labs (all labs ordered are listed, but only abnormal results are displayed) Labs Reviewed - No data to display  EKG None  Radiology CT Head Wo Contrast  Result Date: 09/03/2023 CLINICAL DATA:  Larey Seat going up steps. Struck head. Laceration to right side of head. EXAM: CT HEAD WITHOUT CONTRAST CT CERVICAL SPINE WITHOUT CONTRAST TECHNIQUE: Multidetector CT imaging of the head and cervical spine was performed following the standard protocol without intravenous contrast. Multiplanar CT image reconstructions of the cervical spine were also generated. RADIATION DOSE REDUCTION: This exam was performed according to the departmental dose-optimization program which includes automated exposure control, adjustment of the mA and/or kV according to patient size and/or use of iterative reconstruction technique. COMPARISON:  CT head and cervical spine 04/08/2023 FINDINGS: CT HEAD FINDINGS Brain: No intracranial hemorrhage, mass effect, or evidence of acute infarct. Stable ventriculomegaly. No extra-axial fluid collection. Age related cerebral atrophy and chronic small vessel ischemic disease. Chronic left thalamic infarct. Vascular: No hyperdense vessel. Intracranial arterial calcification. Skull: No fracture or focal lesion. Laceration and hematoma to the right frontal-parietal scalp. Sinuses/Orbits: No acute finding. Other: None. CT  CERVICAL SPINE FINDINGS Alignment: No evidence of traumatic malalignment. Skull base and vertebrae: No acute fracture. No primary bone lesion or focal pathologic process. Soft tissues and spinal canal: No prevertebral fluid or swelling. No visible canal hematoma. Disc levels: ACDF C4-C6. Multilevel age-related spondylosis and facet arthropathy. Upper chest: No acute abnormality. Other: Carotid calcification. IMPRESSION: 1. No acute intracranial abnormality. Generalized atrophy and small vessel white matter disease. 2. No acute fracture in the cervical spine. Multilevel degenerative spondylosis. 3. Laceration/hematoma to the right scalp.  No calvarial fracture. Electronically Signed   By: Minerva Fester M.D.   On: 09/03/2023 23:04   CT Cervical Spine Wo Contrast  Result Date: 09/03/2023 CLINICAL DATA:  Larey Seat going up steps. Struck head. Laceration to right side of head. EXAM: CT HEAD WITHOUT CONTRAST CT CERVICAL SPINE WITHOUT CONTRAST TECHNIQUE: Multidetector CT imaging of the head and cervical spine was performed following the standard protocol without intravenous contrast. Multiplanar CT image reconstructions of the cervical spine were also generated. RADIATION DOSE REDUCTION: This exam was performed according to the departmental dose-optimization program which includes automated exposure control, adjustment of the mA and/or kV according to patient size and/or use of iterative reconstruction technique. COMPARISON:  CT head and cervical spine 04/08/2023 FINDINGS: CT HEAD FINDINGS Brain: No intracranial hemorrhage, mass effect, or evidence of acute infarct. Stable ventriculomegaly. No extra-axial fluid collection. Age related cerebral atrophy and chronic small vessel ischemic disease. Chronic left thalamic infarct. Vascular: No hyperdense vessel. Intracranial arterial calcification. Skull: No fracture or focal lesion. Laceration and hematoma to the right frontal-parietal scalp. Sinuses/Orbits: No acute finding.  Other: None. CT CERVICAL SPINE FINDINGS Alignment: No evidence of traumatic malalignment. Skull base and vertebrae: No acute fracture. No primary bone lesion or focal pathologic process. Soft tissues and spinal canal: No prevertebral fluid or swelling. No visible canal hematoma. Disc levels: ACDF C4-C6. Multilevel age-related spondylosis and facet arthropathy. Upper chest: No acute abnormality. Other: Carotid calcification. IMPRESSION: 1. No acute intracranial abnormality. Generalized atrophy and small vessel white matter disease. 2. No acute fracture in the cervical spine. Multilevel degenerative spondylosis. 3. Laceration/hematoma to the right scalp.  No calvarial fracture. Electronically Signed   By: Minerva Fester M.D.   On: 09/03/2023 23:04    Procedures Procedures    Medications Ordered in ED Medications - No data to display  ED Course/ Medical Decision Making/ A&P Clinical Course as of 09/03/23 2311  Thu Sep 03, 2023  2307 No acute traumatic injury on CT or C-spine. Patient is stable for discharge home with outpatient follow up. [VK]    Clinical Course User Index [VK] Rexford Maus, DO                                 Medical Decision Making This patient presents to the ED with chief complaint(s) of fall with pertinent past medical history of dementia, HTN which further complicates the presenting complaint. The complaint involves an extensive differential diagnosis and also carries with it a high risk of complications and morbidity.    The differential diagnosis includes ICH, mass effect, no other traumatic injury seen on exam, no presyncopal symptoms a syncopal fall likely, abrasion, skin tear  Additional history obtained: Additional history obtained from spouse Records reviewed Care Everywhere/External Records  ED Course and Reassessment: On patient's arrival he is hemodynamically stable in no acute distress.  He was initially evaluated in triage and had head CT and  C-spine performed.  No other traumatic injury seen on exam.  He does have skin tears but no underlying bony tenderness.  He reports his tetanus is up-to-date.  He will have his wounds irrigated and placed with nonstick dressing.  He declines any pain control at this time.  Independent labs interpretation:  N/A  Independent visualization of imaging: - I independently visualized the following imaging with scope of interpretation limited to determining acute life threatening conditions related to emergency care: CTH/C-spine, which revealed no acute traumatic injury  Consultation: - Consulted or discussed management/test interpretation w/ external professional: N/A  Consideration for admission or further workup: Patient has no emergent conditions  requiring admission or further work-up at this time and is stable for discharge home with primary care follow-up  Social Determinants of health: n/a    Amount and/or Complexity of Data Reviewed Radiology: ordered.          Final Clinical Impression(s) / ED Diagnoses Final diagnoses:  Fall, initial encounter  Skin tear of right forearm without complication, initial encounter  Abrasion of scalp, initial encounter    Rx / DC Orders ED Discharge Orders     None         Rexford Maus, DO 09/03/23 2311

## 2023-09-11 ENCOUNTER — Ambulatory Visit: Payer: Medicare Other | Attending: Cardiology | Admitting: Cardiology

## 2023-09-11 ENCOUNTER — Encounter: Payer: Self-pay | Admitting: Cardiology

## 2023-09-11 VITALS — BP 138/66 | HR 57 | Ht 64.0 in | Wt 147.8 lb

## 2023-09-11 DIAGNOSIS — E782 Mixed hyperlipidemia: Secondary | ICD-10-CM | POA: Diagnosis not present

## 2023-09-11 DIAGNOSIS — I951 Orthostatic hypotension: Secondary | ICD-10-CM | POA: Insufficient documentation

## 2023-09-11 DIAGNOSIS — I10 Essential (primary) hypertension: Secondary | ICD-10-CM | POA: Insufficient documentation

## 2023-09-11 NOTE — Patient Instructions (Signed)

## 2023-09-11 NOTE — Progress Notes (Signed)
Cardiology Office Note:  .   Date:  09/11/2023  ID:  Cody Gonzalez, DOB [birth year], MRN [id number] PCP:  Cody Pilon, FNP  Former Cardiology Providers: None [facility] Providers Cardiologist:  Cody Lerner, DO , [name] (established care 08/20/2022) Electrophysiologist:  None  Click to update primary MD,subspecialty MD or APP then REFRESH:1}    Chief Complaint  Patient presents with   Follow-up    1 year-history of orthostatic hypotension    History of Present Illness: .   Cody Gonzalez is a 77 y.o. Caucasian male whose past medical history and cardiovascular risk factors includes: Benign essential hypertension, mixed hyperlipidemia, gastritis/iron deficiency anemia, prostate cancer, chronic hyponatremia, Alzheimer's disease, advanced age.   Patient was referred to the practice for evaluation and management of orthostatic hypotension.  In the past we discontinued his vasodilator therapy and with medication titration his symptoms have improved significantly.  He presents today for a 1 year follow-up visit.  Over the last 1 year patient denies any anginal chest pain or heart failure symptoms.  He is walking at least 3 times a week and feeling well.  No hospitalizations or urgent care visits from a cardiovascular standpoint.  His wife informs me that he has been diagnosed with Alzheimer's disease in the interim and has been started on memantine.  Review of Systems: .   Review of Systems  Cardiovascular:  Negative for chest pain, claudication, irregular heartbeat, leg swelling, near-syncope, orthopnea, palpitations, paroxysmal nocturnal dyspnea and syncope.  Respiratory:  Negative for shortness of breath.   Hematologic/Lymphatic: Negative for bleeding problem.    Studies Reviewed:   WUJ:WJXBJ bradycardia Nonspecific T wave abnormality When compared with ECG of 20-Feb-2022 22:26, No significant change was found Confirmed by Cody Gonzalez [phone]) on 09/11/2023  9:01:25 AM  Echocardiogram: March 2023: LVEF 60-65%, global longitudinal strain -22.9%, normal diastolic function Moderate LAE Mild MR Trivial TR Estimated RAP 3 mmHg  RADIOLOGY: NA  Risk Assessment/Calculations:   NA   Labs:       Latest Ref Rng & Units 10/13/2022    9:43 AM 08/13/2022    9:48 AM 06/13/2022    9:41 AM  CBC  WBC 4.0 - 10.5 K/uL 3.0  3.4  2.7   Hemoglobin 13.0 - 17.0 g/dL 56.2  13.0  9.5   Hematocrit 39.0 - 52.0 % 35.6  34.7  27.5   Platelets 150 - 400 K/uL 197  211  217        Latest Ref Rng & Units 10/13/2022    9:43 AM 08/13/2022    9:48 AM 06/13/2022    9:41 AM  BMP  Glucose 70 - 99 mg/dL 865  95  784   BUN 8 - 23 mg/dL 32  31  38   Creatinine 0.61 - 1.24 mg/dL 6.96  2.95  2.84   Sodium 135 - 145 mmol/L 130  128  127   Potassium 3.5 - 5.1 mmol/L 4.6  4.0  4.3   Chloride 98 - 111 mmol/L 95  95  95   CO2 22 - 32 mmol/L 31  28  26    Calcium 8.9 - 10.3 mg/dL 9.2  8.7  8.6       Latest Ref Rng & Units 10/13/2022    9:43 AM 08/13/2022    9:48 AM 06/13/2022    9:41 AM  CMP  Glucose 70 - 99 mg/dL 132  95  440   BUN 8 - 23 mg/dL 32  31  38   Creatinine 0.61 - 1.24 mg/dL 4.09  8.11  9.14   Sodium 135 - 145 mmol/L 130  128  127   Potassium 3.5 - 5.1 mmol/L 4.6  4.0  4.3   Chloride 98 - 111 mmol/L 95  95  95   CO2 22 - 32 mmol/L 31  28  26    Calcium 8.9 - 10.3 mg/dL 9.2  8.7  8.6   Total Protein 6.5 - 8.1 g/dL 6.7  6.8  6.5   Total Bilirubin 0.3 - 1.2 mg/dL 1.2  1.1  0.8   Alkaline Phos 38 - 126 U/L 113  109  138   AST 15 - 41 U/L 21  18  14    ALT 0 - 44 U/L 19  18  12      No results found for: "CHOL", "HDL", "LDLCALC", "LDLDIRECT", "TRIG", "CHOLHDL" No results for input(s): "LIPOA" in the last 8760 hours. No components found for: "NTPROBNP" No results for input(s): "PROBNP" in the last 8760 hours. No results for input(s): "TSH" in the last 8760 hours.  External Labs: Collected: May 2024 available in Care Everywhere. BUN 20, creatinine  0.64. Sodium 127 (chronically hyponatremia). AST ALT within normal limits  Physical Exam:    Today's Vitals   09/11/23 0842  BP: 138/66  Pulse: (!) 57  SpO2: 96%  Weight: 147 lb 12.8 oz (67 kg)  Height: 5\' 4"  (1.626 m)   Body mass index is 25.37 kg/m. Wt Readings from Last 3 Encounters:  09/11/23 147 lb 12.8 oz (67 kg)  09/03/23 145 lb (65.8 kg)  08/12/23 144 lb 3.2 oz (65.4 kg)    Physical Exam  Constitutional: No distress.  Walks w/ cane, hemodynamically stable  Neck: No JVD present.  Cardiovascular: Normal rate, regular rhythm, S1 normal and S2 normal. Exam reveals no gallop, no S3 and no S4.  No murmur heard. Pulmonary/Chest: Effort normal and breath sounds normal. No stridor. He has no wheezes. He has no rales.  Abdominal: Soft. Bowel sounds are normal. He exhibits no distension. There is no abdominal tenderness.  Musculoskeletal:        General: No edema.     Cervical back: Neck supple.  Neurological: He is alert and oriented to person, place, and time. He has intact cranial nerves (2-12).  Skin: Skin is warm.   Impression & Recommendation(s):  Impression:   ICD-10-CM   1. Benign hypertension  I10 EKG 12-Lead    2. Orthostatic hypotension  I95.1     3. Mixed hyperlipidemia  E78.2        Recommendation(s):  Benign hypertension Orthostatic hypotension History of orthostatic hypotension-resolved after titration of medical therapy. Presents today for 1 year follow-up visit. Home blood pressure log reviewed and clinically denies orthostasis. Continue carvedilol 6.25 mg p.o. twice daily. Continue telmisartan 80 mg p.o. daily. Patient was recently started on memantine for Alzheimer's disease which can predispose him to bradycardia.  However, patient's wife states that his pulse has been relatively well-controlled at home.  If this becomes an issue I would first recommend transitioning him off of carvedilol to a different antihypertensive medication.  Mixed  hyperlipidemia Currently on Lipitor 40 mg p.o. nightly. Patient states that labs were recently checked with PCP-I do not have a copy for reference.  Orders Placed:  Orders Placed This Encounter  Procedures   EKG 12-Lead   Final Medication List:   No orders of the defined types were placed in this encounter.   Medications Discontinued  During This Encounter  Medication Reason   meloxicam (MOBIC) 15 MG tablet No longer needed (for PRN medications)   Romosozumab-aqqg ([facility] [place])    White Petrolatum-Mineral Oil (ARTIFICIAL TEARS) 83-15 % OINT      Current Outpatient Medications:    alendronate (FOSAMAX) 70 MG tablet, Take 70 mg by mouth once a week., Disp: , Rfl:    alfuzosin (UROXATRAL) 10 MG 24 hr tablet, Take 10 mg by mouth at bedtime., Disp: , Rfl:    ascorbic acid (VITAMIN C) 1000 MG tablet, Take 1,000 mg by mouth daily., Disp: , Rfl:    atorvastatin (LIPITOR) 40 MG tablet, Take 40 mg by mouth at bedtime., Disp: , Rfl:    b complex vitamins capsule, Take 1 capsule by mouth daily., Disp: , Rfl:    calcium carbonate (OSCAL) 1500 (600 Ca) MG TABS tablet, 2 tablets once daily, Disp: , Rfl:    carvedilol (COREG) 6.25 MG tablet, Take 1 tablet (6.25 mg total) by mouth 2 (two) times daily., Disp: 180 tablet, Rfl: 3   Cholecalciferol 50 MCG (2000 UT) CAPS, Take 2,000 Units by mouth daily., Disp: , Rfl:    ferrous sulfate 325 (65 FE) MG tablet, Take 325 mg by mouth daily with breakfast., Disp: , Rfl:    memantine (NAMENDA) 5 MG tablet, Take 5 mg by mouth 2 (two) times daily., Disp: , Rfl:    Multiple Vitamins-Iron (MULTIVITAMIN/IRON PO), Take 1 tablet by mouth daily with breakfast., Disp: , Rfl:    telmisartan (MICARDIS) 80 MG tablet, TAKE 1 TABLET BY MOUTH DAILY, Disp: 90 tablet, Rfl: 1   UNABLE TO FIND, Take 15 mg by mouth daily. Med Name: Mickle Mallory, Disp: , Rfl:   Consent:   NA  Disposition:   1 year sooner if needed. Patient may be asked to follow-up sooner based on the results of the  above-mentioned testing.  His questions and concerns were addressed to his satisfaction. He voices understanding of the recommendations provided during this encounter.    Signed, Cody Lerner, DO, [facility]  [facility]  [address] [place], [place] [address] 09/11/2023 11:30 AM

## 2023-09-21 ENCOUNTER — Ambulatory Visit (INDEPENDENT_AMBULATORY_CARE_PROVIDER_SITE_OTHER): Payer: Medicare Other | Admitting: Podiatry

## 2023-09-21 ENCOUNTER — Encounter: Payer: Self-pay | Admitting: Podiatry

## 2023-09-21 DIAGNOSIS — M79675 Pain in left toe(s): Secondary | ICD-10-CM

## 2023-09-21 DIAGNOSIS — M79674 Pain in right toe(s): Secondary | ICD-10-CM

## 2023-09-21 DIAGNOSIS — B351 Tinea unguium: Secondary | ICD-10-CM

## 2023-09-21 NOTE — Progress Notes (Signed)
This patient returns to the office for evaluation and treatment of long thick painful nails .  This patient is unable to trim his own nails since the patient cannot reach his feet.  Patient says the nails are painful walking and wearing his shoes.  He returns for preventive foot care services. ? ?General Appearance  Alert, conversant and in no acute stress. ? ?Vascular  Dorsalis pedis and posterior tibial  pulses are palpable  bilaterally.  Capillary return is within normal limits  bilaterally. Temperature is within normal limits  bilaterally. ? ?Neurologic  Senn-Weinstein monofilament wire test within normal limits  bilaterally. Muscle power within normal limits bilaterally. ? ?Nails Thick disfigured discolored nails with subungual debris  hallux nails bilaterally. No evidence of bacterial infection or drainage bilaterally. ? ?Orthopedic  No limitations of motion  feet .  No crepitus or effusions noted.  No bony pathology or digital deformities noted. ? ?Skin  normotropic skin with no porokeratosis noted bilaterally.  No signs of infections or ulcers noted.    ? ?Onychomycosis  Pain in toes right foot  Pain in toes left foot ? ?Debridement  of nails  1-5  B/L with a nail nipper.  Nails were then filed using a dremel tool with no incidents.    RTC  4 months  ? ? ?Gardiner Barefoot DPM   ?

## 2023-09-30 ENCOUNTER — Other Ambulatory Visit: Payer: Self-pay | Admitting: Cardiology

## 2023-09-30 DIAGNOSIS — I951 Orthostatic hypotension: Secondary | ICD-10-CM

## 2023-10-16 ENCOUNTER — Other Ambulatory Visit: Payer: Self-pay

## 2023-10-16 DIAGNOSIS — D649 Anemia, unspecified: Secondary | ICD-10-CM

## 2023-10-16 DIAGNOSIS — C61 Malignant neoplasm of prostate: Secondary | ICD-10-CM

## 2023-10-19 ENCOUNTER — Inpatient Hospital Stay: Payer: Medicare Other | Attending: Internal Medicine

## 2023-10-19 ENCOUNTER — Inpatient Hospital Stay (HOSPITAL_BASED_OUTPATIENT_CLINIC_OR_DEPARTMENT_OTHER): Payer: Medicare Other | Admitting: Hematology

## 2023-10-19 VITALS — BP 142/74 | HR 56 | Temp 97.3°F | Resp 16 | Wt 149.0 lb

## 2023-10-19 DIAGNOSIS — Z8546 Personal history of malignant neoplasm of prostate: Secondary | ICD-10-CM | POA: Insufficient documentation

## 2023-10-19 DIAGNOSIS — G309 Alzheimer's disease, unspecified: Secondary | ICD-10-CM | POA: Diagnosis not present

## 2023-10-19 DIAGNOSIS — F02B0 Dementia in other diseases classified elsewhere, moderate, without behavioral disturbance, psychotic disturbance, mood disturbance, and anxiety: Secondary | ICD-10-CM | POA: Diagnosis not present

## 2023-10-19 DIAGNOSIS — D649 Anemia, unspecified: Secondary | ICD-10-CM | POA: Diagnosis not present

## 2023-10-19 DIAGNOSIS — C61 Malignant neoplasm of prostate: Secondary | ICD-10-CM | POA: Diagnosis not present

## 2023-10-19 LAB — CMP (CANCER CENTER ONLY)
ALT: 17 U/L (ref 0–44)
AST: 20 U/L (ref 15–41)
Albumin: 4.4 g/dL (ref 3.5–5.0)
Alkaline Phosphatase: 54 U/L (ref 38–126)
Anion gap: 5 (ref 5–15)
BUN: 37 mg/dL — ABNORMAL HIGH (ref 8–23)
CO2: 30 mmol/L (ref 22–32)
Calcium: 9.4 mg/dL (ref 8.9–10.3)
Chloride: 95 mmol/L — ABNORMAL LOW (ref 98–111)
Creatinine: 0.77 mg/dL (ref 0.61–1.24)
GFR, Estimated: >60 mL/min (ref >60)
Glucose, Bld: 90 mg/dL (ref 70–99)
Potassium: 4.1 mmol/L (ref 3.5–5.1)
Sodium: 130 mmol/L — ABNORMAL LOW (ref 135–145)
Total Bilirubin: 1.7 mg/dL — ABNORMAL HIGH (ref <1.2)
Total Protein: 6.7 g/dL (ref 6.5–8.1)

## 2023-10-19 LAB — CBC WITH DIFFERENTIAL (CANCER CENTER ONLY)
Abs Immature Granulocytes: 0.01 10*3/uL (ref 0.00–0.07)
Basophils Absolute: 0 10*3/uL (ref 0.0–0.1)
Basophils Relative: 0 %
Eosinophils Absolute: 0.1 10*3/uL (ref 0.0–0.5)
Eosinophils Relative: 2 %
HCT: 37.8 % — ABNORMAL LOW (ref 39.0–52.0)
Hemoglobin: 12.9 g/dL — ABNORMAL LOW (ref 13.0–17.0)
Immature Granulocytes: 0 %
Lymphocytes Relative: 9 %
Lymphs Abs: 0.4 10*3/uL — ABNORMAL LOW (ref 0.7–4.0)
MCH: 34.7 pg — ABNORMAL HIGH (ref 26.0–34.0)
MCHC: 34.1 g/dL (ref 30.0–36.0)
MCV: 101.6 fL — ABNORMAL HIGH (ref 80.0–100.0)
Monocytes Absolute: 0.5 10*3/uL (ref 0.1–1.0)
Monocytes Relative: 10 %
Neutro Abs: 3.6 10*3/uL (ref 1.7–7.7)
Neutrophils Relative %: 79 %
Platelet Count: 193 10*3/uL (ref 150–400)
RBC: 3.72 MIL/uL — ABNORMAL LOW (ref 4.22–5.81)
RDW: 12.4 % (ref 11.5–15.5)
WBC Count: 4.6 10*3/uL (ref 4.0–10.5)
nRBC: 0 % (ref 0.0–0.2)

## 2023-10-19 LAB — FERRITIN: Ferritin: 205 ng/mL (ref 24–336)

## 2023-10-19 LAB — SAMPLE TO BLOOD BANK

## 2023-10-19 NOTE — Progress Notes (Signed)
[facility]   [phone] [phone]) [phone]   Clinic Follow up Note   Patient Care Team: Soundra Pilon, FNP as PCP - General (Family Medicine) Tessa Lerner, DO as PCP - Cardiology (Cardiology) Felicita Gage, RN Nurse Navigator as Registered Nurse (Medical Oncology)  Date of Service:  10/19/2023  CHIEF COMPLAINT: f/u of anemia   CURRENT THERAPY:  Oral iron   Assessment and Plan    1.  Remove the first discharge of the bladder is full to [place] travel to [place] dismission dictating to resume all excluding bruising, which is chronic which she is already sebacic which usually does not become asymptomatic symptoms with daughter whether to do this to see gynecologist for chronic anemia 20 mg also nodule in all the home.  Damage Johnson syndrome the ER immediately your temporary or total bili of 32 nodule at [facility] tissue within the uterine tubular she can complaints.  She has tried normal results with the most also watercolors also anemia Hemoglobin level improved to 12.9 g/dL from last year's 4-54 g/dL. Effective management with oral iron supplements and vitamin C. No constipation or gastrointestinal issues. No recent surgeries or transfusions. - Continue oral iron supplementation with vitamin C - Annual blood counts and iron levels with primary care physician - Monitor for signs of anemia such as pallor or bleeding and contact primary care physician if these occur  Prostate Cancer Diagnosed in 2020. Annual PSA checks with Dr. Kathrynn Running remain nondetectable. - Continue annual PSA checks with Dr. Kathrynn Running  Alzheimer's Disease Mild to moderate Alzheimer's disease, managed at [facility]. Patient remains independent in daily activities. - Continue current management and follow-up with treating physician at [facility] Maintenance Discussed dietary iron sources. Patient consumes chicken, fish, and beans, but not beef. - Encourage consumption of iron-rich foods -  Annual iron level checks to avoid potential iron overload  Plan -lab reviewed, Hg 12.9, iron study pending. I will see him as needed in future  - Follow up with primary care physician in May for annual wellness visit, please include CBC and iron study  - Contact hematology/oncology if concerns about anemia or if blood counts drop.        Discussed the use of AI scribe software for clinical note transcription with the patient, who gave verbal consent to proceed.  History of Present Illness   The patient is a 36-58 year old gentleman with a history of anemia, Alzheimer's disease, and prostate cancer. The patient was recently diagnosed with Alzheimer's disease, which is described as mild to moderate in severity. The patient is independent and does not require constant supervision. The patient's anemia is being managed with oral iron supplements, which the patient tolerates well. The patient's blood counts have improved since the last visit. The patient also has a history of prostate cancer, which is being monitored with annual PSA checks. The patient's PSA levels have remained nondetectable. The patient has had multiple surgeries in the past, the most recent of which was a year and a half ago. The patient has had a couple of transfusions following the surgeries. The patient has also had a couple of falls, the most recent of which was on Halloween night.         All other systems were reviewed with the patient and are negative.  MEDICAL HISTORY:  Past Medical History:  Diagnosis Date   Anemia    Hypertension    Prostate cancer (HCC)    Sleep apnea  uses cpap    TBI (traumatic brain injury) (HCC) 2015    SURGICAL HISTORY: Past Surgical History:  Procedure Laterality Date   BACK SURGERY     BASAL CELL CARCINOMA EXCISION     BILATERAL CARPAL TUNNEL RELEASE  2014   CYSTOSCOPY N/A 06/29/2019   Procedure: CYSTOSCOPY;  Surgeon: Sebastian Ache, MD;  Location: [facility];   Service: Urology;  Laterality: N/A;  No seeds in bladder per Dr. Drue Flirt LAMINECTOMY/DECOMPRESSION MICRODISCECTOMY Left 05/28/2021   Procedure: Left  - Lumbar five-Sacral one Laminectomy resection of synovial cyst;  Surgeon: Julio Sicks, MD;  Location: [facility] OR;  Service: Neurosurgery;  Laterality: Left;   PROSTATE BIOPSY     RADIOACTIVE SEED IMPLANT N/A 06/29/2019   Procedure: RADIOACTIVE SEED IMPLANT/BRACHYTHERAPY IMPLANT;  Surgeon: Sebastian Ache, MD;  Location: [facility];  Service: Urology;  Laterality: N/A;  90 MINS   SPACE OAR INSTILLATION N/A 06/29/2019   Procedure: SPACE OAR INSTILLATION;  Surgeon: Sebastian Ache, MD;  Location: [facility];  Service: Urology;  Laterality: N/A;    I have reviewed the social history and family history with the patient and they are unchanged from previous note.  ALLERGIES:  is allergic to nifedipine and amlodipine besylate.  MEDICATIONS:  Current Outpatient Medications  Medication Sig Dispense Refill   alendronate (FOSAMAX) 70 MG tablet Take 70 mg by mouth once a week.     alfuzosin (UROXATRAL) 10 MG 24 hr tablet Take 10 mg by mouth at bedtime.     ascorbic acid (VITAMIN C) 1000 MG tablet Take 1,000 mg by mouth daily.     atorvastatin (LIPITOR) 40 MG tablet Take 40 mg by mouth at bedtime.     b complex vitamins capsule Take 1 capsule by mouth daily.     calcium carbonate (OSCAL) 1500 (600 Ca) MG TABS tablet 2 tablets once daily     carvedilol (COREG) 6.25 MG tablet TAKE 1 TABLET BY MOUTH TWICE A DAY 180 tablet 3   Cholecalciferol 50 MCG (2000 UT) CAPS Take 2,000 Units by mouth daily.     ferrous sulfate 325 (65 FE) MG tablet Take 325 mg by mouth daily with breakfast.     memantine (NAMENDA) 5 MG tablet Take 5 mg by mouth 2 (two) times daily.     Multiple Vitamins-Iron (MULTIVITAMIN/IRON PO) Take 1 tablet by mouth daily with breakfast.     telmisartan (MICARDIS) 80 MG tablet TAKE 1 TABLET BY MOUTH DAILY 90 tablet  1   UNABLE TO FIND Take 15 mg by mouth daily. Med Name: Mickle Mallory     No current facility-administered medications for this visit.    PHYSICAL EXAMINATION: ECOG PERFORMANCE STATUS: 1 - Symptomatic but completely ambulatory  Vitals:   10/19/23 1050 10/19/23 1054  BP: (!) 163/70 (!) 142/74  Pulse: (!) 56   Resp: 16   Temp: (!) 97.3 F (36.3 C)   SpO2: 100%    Wt Readings from Last 3 Encounters:  10/19/23 149 lb (67.6 kg)  09/11/23 147 lb 12.8 oz (67 kg)  09/03/23 145 lb (65.8 kg)     GENERAL:alert, no distress and comfortable SKIN: skin color, texture, turgor are normal, no rashes or significant lesions EYES: normal, Conjunctiva are pink and non-injected, sclera clear Musculoskeletal:no cyanosis of digits and no clubbing  NEURO: alert & oriented x 3 with fluent speech, no focal motor/sensory deficits   LABORATORY DATA:  I have reviewed the data as listed  Latest Ref Rng & Units 10/19/2023   10:42 AM 10/13/2022    9:43 AM 08/13/2022    9:48 AM  CBC  WBC 4.0 - 10.5 K/uL 4.6  3.0  3.4   Hemoglobin 13.0 - 17.0 g/dL 13.2  44.0  10.2   Hematocrit 39.0 - 52.0 % 37.8  35.6  34.7   Platelets 150 - 400 K/uL 193  197  211         Latest Ref Rng & Units 10/19/2023   10:42 AM 10/13/2022    9:43 AM 08/13/2022    9:48 AM  CMP  Glucose 70 - 99 mg/dL 90  725  95   BUN 8 - 23 mg/dL 37  32  31   Creatinine 0.61 - 1.24 mg/dL 3.66  4.40  3.47   Sodium 135 - 145 mmol/L 130  130  128   Potassium 3.5 - 5.1 mmol/L 4.1  4.6  4.0   Chloride 98 - 111 mmol/L 95  95  95   CO2 22 - 32 mmol/L 30  31  28    Calcium 8.9 - 10.3 mg/dL 9.4  9.2  8.7   Total Protein 6.5 - 8.1 g/dL 6.7  6.7  6.8   Total Bilirubin <1.2 mg/dL 1.7  1.2  1.1   Alkaline Phos 38 - 126 U/L 54  113  109   AST 15 - 41 U/L 20  21  18    ALT 0 - 44 U/L 17  19  18        RADIOGRAPHIC STUDIES: I have personally reviewed the radiological images as listed and agreed with the findings in the report. No results found.     No orders of the defined types were placed in this encounter.  All questions were answered. The patient knows to call the clinic with any problems, questions or concerns. No barriers to learning was detected. The total time spent in the appointment was 15 minutes.     Malachy Mood, MD 10/19/2023

## 2023-11-25 ENCOUNTER — Encounter: Payer: Self-pay | Admitting: Cardiology

## 2023-11-26 ENCOUNTER — Other Ambulatory Visit: Payer: Self-pay

## 2023-11-26 DIAGNOSIS — I10 Essential (primary) hypertension: Secondary | ICD-10-CM

## 2023-11-26 MED ORDER — SPIRONOLACTONE 25 MG PO TABS: 25.0000 mg | ORAL_TABLET | Freq: Every day | ORAL | 1 refills | Status: DC

## 2023-11-26 NOTE — Telephone Encounter (Signed)
Can do Aldactone 25mg  po qAM w/ BMP in one week.  Make sure he is not on potassium supplements. If so hold them.   Clell Trahan Maunie, DO, [name]

## 2023-11-26 NOTE — Telephone Encounter (Signed)
Add Norvasc 5mg  po qAM.   Tessa Lerner, DO, [facility]

## 2023-11-26 NOTE — Progress Notes (Signed)
Order for Spironolactone 25 mg once daily in the morning and a repeat BMP in 1 week ordered per Dr. Emelda Brothers request.

## 2023-11-26 NOTE — Telephone Encounter (Signed)
Please call and find out when he takes the valsartan (am or Pm)?  Amron Guerrette Fontana, DO, [facility]

## 2023-12-03 ENCOUNTER — Other Ambulatory Visit: Payer: Self-pay

## 2023-12-03 DIAGNOSIS — I10 Essential (primary) hypertension: Secondary | ICD-10-CM

## 2023-12-03 LAB — BASIC METABOLIC PANEL
BUN/Creatinine Ratio: 32 — ABNORMAL HIGH (ref 10–24)
BUN: 23 mg/dL (ref 8–27)
CO2: 21 mmol/L (ref 20–29)
Calcium: 9.7 mg/dL (ref 8.6–10.2)
Chloride: 91 mmol/L — ABNORMAL LOW (ref 96–106)
Creatinine, Ser: 0.71 mg/dL — ABNORMAL LOW (ref 0.76–1.27)
Glucose: 106 mg/dL — ABNORMAL HIGH (ref 70–99)
Potassium: 4.8 mmol/L (ref 3.5–5.2)
Sodium: 130 mmol/L — ABNORMAL LOW (ref 134–144)
eGFR: 94 mL/min/{1.73_m2} (ref >59)

## 2023-12-07 NOTE — Telephone Encounter (Signed)
Yes ST

## 2023-12-11 ENCOUNTER — Other Ambulatory Visit (HOSPITAL_COMMUNITY): Payer: Self-pay | Admitting: Family Medicine

## 2023-12-11 ENCOUNTER — Telehealth: Payer: Self-pay | Admitting: Cardiology

## 2023-12-11 ENCOUNTER — Ambulatory Visit (HOSPITAL_COMMUNITY)
Admission: RE | Admit: 2023-12-11 | Discharge: 2023-12-11 | Disposition: A | Discharge status: Home / Self Care | Payer: Medicare Other | Source: Ambulatory Visit | Attending: Vascular Surgery

## 2023-12-11 DIAGNOSIS — R609 Edema, unspecified: Secondary | ICD-10-CM | POA: Insufficient documentation

## 2023-12-11 NOTE — Telephone Encounter (Signed)
 Pt c/o medication issue:  1. Name of Medication:   spironolactone  (ALDACTONE ) 25 MG tablet   2. How are you currently taking this medication (dosage and times per day)?   As prescribed  3. Are you having a reaction (difficulty breathing--STAT)?   Cramps in right calf  4. What is your medication issue?   Wife Miranda) stated patient has been using this medication for the last two weeks and has been having cramps in his right calf.  Wife wants a call back to discuss next steps. Wife notes patient is having a LE Venous DVT test this afternoon.

## 2023-12-11 NOTE — Telephone Encounter (Signed)
 Based on the last labs can can continue spironolactone -as he was having elevated blood pressure readings.  Cody Kossman Mayhill, DO, [facility]

## 2023-12-11 NOTE — Telephone Encounter (Signed)
 I would continue the spironolactone  as it has helped his blood pressures and labs are remained stable.  Muscle cramps can be caused by multiple reasons-continue to workup for DVT as recommended by PCP.  If muscle cramps become significant then I would recommend holding spironolactone  if that is truly the cause of his discomfort.  Stormi Vandevelde Davis, DO, FACC

## 2023-12-11 NOTE — Telephone Encounter (Signed)
 Called and spoke with patient's wife regarding patient's leg cramps. She states that the patient started on spironolactone  2 weeks ago. Starting Monday of this week he started having muscle cramps particularity in the right calf.  Patient seen his PCP this morning due to the cramping and the PCP ordered labs and a ultrasound of his right leg today at 3pm to r/o a DVT.   W[facility] so his results should be on his medical record for viewing.  Wife would like a call back with advice from Dr Michele is patient should continue on the aldactone .  She states she follows on [facility] and would be able to see any messages sent.

## 2023-12-12 ENCOUNTER — Other Ambulatory Visit: Payer: Self-pay | Admitting: Cardiology

## 2023-12-15 NOTE — Telephone Encounter (Signed)
MyChart message read by patient.  Last read by Merilyn Baba at  9:01 AM on 12/12/2023.  Last read by Sherrilyn Rist at  3:58 PM on 12/11/2023.

## 2023-12-17 NOTE — Progress Notes (Unsigned)
Cardiology Office Note:  .   Date:  12/18/2023  ID:  Cody Gonzalez, DOB November 20, 1945, MRN [id number] PCP: Soundra Pilon, FNP  [facility] Providers Cardiologist:  Tessa Lerner, DO  History of Present Illness: .   Cody Gonzalez is a 78 y.o. male with a past medical history of HTN, orthostatic hypotension, HLD, prostate cancer, chronic hyponatremia, Alzheimer's disease. Patient is followed by Dr. Odis Hollingshead and presents today for a follow up appointment   Patient was initially referred to cardiology for evaluation and management of orthostatic hypotension. His vasodilator therapies were discontinued, and his medications improved significantly. He previously underwent echocardiogram on 01/27/22 that showed EF 60-65%, no regional wall motion abnormalities, normal RV function, mild MR.   Patient was last seen by Dr. Odis Hollingshead on 09/11/23. At that time, patient was doing well. He denied chest pain or heart failure symptoms. Denied recent hospitalizations. He had recently been diagnosed with Alzheimer's disease. He remained on carvedilol 6.25 mg BID, telmisartan 80 mg daily, lipitor 40 mg daily. In 11/2023, he contacted the office with elevated BP. He was started on spironolactone.   Today, patient presents for evaluation of elevated BP and persistent calf pain and swelling. His calf pain started approximately two weeks after initiating spironolactone for hypertension. He felt like he was developing cramping in his right leg. There was concern that his pain was a side effect of his spironolactone, so it was discontinued. However, the calf pain, described as a constant presence with occasional cramping sensations, persisted. The pain  seems to be exacerbated by standing or walking. His PCP ordered a DVT scan last week which showed no evidence of DVT. He saw ortho earlier today who ordered x-ray and MRI. The patient's wife has been monitoring his blood pressure at home, which has been slightly  elevated. The patient denies any dizziness or other symptoms suggestive of orthostatic hypotension. Denies chest pain, shortness of breath.    ROS: Per HPI   Studies Reviewed: .    Cardiac Studies & Procedures   ______________________________________________________________________________________________     ECHOCARDIOGRAM  ECHOCARDIOGRAM COMPLETE 01/27/2022  Narrative ECHOCARDIOGRAM REPORT    Patient Name:   Cody Gonzalez Date of Exam: 01/27/2022 Medical Rec #:  [id number]              Height:       67.0 in Accession #:    [id number]             Weight:       163.7 lb Date of Birth:  09-11-1946               BSA:          1.857 m Patient Age:    76 years               BP:           167/77 mmHg Patient Gender: M                      HR:           74 bpm. Exam Location:  [facility]  Procedure: 2D Echo, Cardiac Doppler, Color Doppler, 3D Echo and Strain Analysis  Indications:    Syncope R55; Recent epidural infection  History:        Patient has no prior history of Echocardiogram examinations. Risk Factors:Hypertension.  Sonographer:    Thurman Coyer RDCS Referring Phys: [id number] Tessa Lerner  IMPRESSIONS  1. Left ventricular ejection fraction, by estimation, is 60 to 65%. The left ventricle has normal function. The left ventricle has no regional wall motion abnormalities. Left ventricular diastolic parameters were normal. The average left ventricular global longitudinal strain is -22.9 %. The global longitudinal strain is normal. 2. Right ventricular systolic function is normal. The right ventricular size is normal. 3. Left atrial size was moderately dilated. 4. The mitral valve is grossly normal. Mild mitral valve regurgitation. No evidence of mitral stenosis. 5. The aortic valve is grossly normal. Aortic valve regurgitation is trivial. Aortic valve sclerosis is present, with no evidence of aortic valve stenosis. 6. The inferior vena cava is normal in size  with greater than 50% respiratory variability, suggesting right atrial pressure of 3 mmHg.  FINDINGS Left Ventricle: Left ventricular ejection fraction, by estimation, is 60 to 65%. The left ventricle has normal function. The left ventricle has no regional wall motion abnormalities. The average left ventricular global longitudinal strain is -22.9 %. The global longitudinal strain is normal. The left ventricular internal cavity size was normal in size. There is no left ventricular hypertrophy. Left ventricular diastolic parameters were normal.  Right Ventricle: The right ventricular size is normal. No increase in right ventricular wall thickness. Right ventricular systolic function is normal.  Left Atrium: Left atrial size was moderately dilated.  Right Atrium: Right atrial size was normal in size.  Pericardium: There is no evidence of pericardial effusion.  Mitral Valve: The mitral valve is grossly normal. Mild mitral valve regurgitation. No evidence of mitral valve stenosis.  Tricuspid Valve: The tricuspid valve is normal in structure. Tricuspid valve regurgitation is not demonstrated. No evidence of tricuspid stenosis.  Aortic Valve: The aortic valve is grossly normal. Aortic valve regurgitation is trivial. Aortic regurgitation PHT measures 362 msec. Aortic valve sclerosis is present, with no evidence of aortic valve stenosis.  Pulmonic Valve: The pulmonic valve was grossly normal. Pulmonic valve regurgitation is not visualized. No evidence of pulmonic stenosis.  Aorta: The aortic root and ascending aorta are structurally normal, with no evidence of dilitation.  Venous: The inferior vena cava is normal in size with greater than 50% respiratory variability, suggesting right atrial pressure of 3 mmHg.  IAS/Shunts: The atrial septum is grossly normal.   LEFT VENTRICLE PLAX 2D LVIDd:         5.20 cm      Diastology LVIDs:         2.90 cm      LV e' medial:    7.05 cm/s LV PW:          0.90 cm      LV E/e' medial:  10.6 LV IVS:        1.10 cm      LV e' lateral:   10.50 cm/s LVOT diam:     2.20 cm      LV E/e' lateral: 7.1 LV SV:         86 LV SV Index:   46           2D Longitudinal Strain LVOT Area:     3.80 cm     2D Strain GLS Avg:     -22.9 %  LV Volumes (MOD) LV vol d, MOD A2C: 84.6 ml  3D Volume EF: LV vol d, MOD A4C: 118.0 ml 3D EF:        65 % LV vol s, MOD A2C: 30.7 ml  LV EDV:       139 ml  LV vol s, MOD A4C: 43.6 ml  LV ESV:       49 ml LV SV MOD A2C:     53.9 ml  LV SV:        90 ml LV SV MOD A4C:     118.0 ml LV SV MOD BP:      62.2 ml  RIGHT VENTRICLE RV Basal diam:  3.90 cm RV Mid diam:    3.10 cm RV S prime:     16.50 cm/s TAPSE (M-mode): 2.0 cm  LEFT ATRIUM             Index        RIGHT ATRIUM           Index LA diam:        4.50 cm 2.42 cm/m   RA Area:     19.70 cm LA Vol (A2C):   76.8 ml 41.35 ml/m  RA Volume:   56.90 ml  30.64 ml/m LA Vol (A4C):   83.4 ml 44.91 ml/m LA Biplane Vol: 83.3 ml 44.85 ml/m AORTIC VALVE LVOT Vmax:   99.80 cm/s LVOT Vmean:  67.300 cm/s LVOT VTI:    0.226 m AI PHT:      362 msec  AORTA Ao Root diam: 3.00 cm  MITRAL VALVE MV Area (PHT): 3.42 cm    SHUNTS MV Decel Time: 222 msec    Systemic VTI:  0.23 m MV E velocity: 74.70 cm/s  Systemic Diam: 2.20 cm MV A velocity: 69.40 cm/s MV E/A ratio:  1.08  Sunit Tolia DO Electronically signed by Tessa Lerner DO Signature Date/Time: 01/29/2022/11:59:48 PM    Final          ______________________________________________________________________________________________      Risk Assessment/Calculations:             Physical Exam:   VS:  BP 138/62   Pulse (!) 59   Ht 5\' 5"  (1.651 m)   Wt 152 lb 3.2 oz (69 kg)   SpO2 95%   BMI 25.33 kg/m    Wt Readings from Last 3 Encounters:  12/18/23 152 lb 3.2 oz (69 kg)  10/19/23 149 lb (67.6 kg)  09/11/23 147 lb 12.8 oz (67 kg)    GEN: Well nourished, well developed in no acute distress. Sitting  upright on the exam table  NECK: No JVD  CARDIAC: RRR, no murmurs, rubs, gallops. Radial pulses 2+ bilaterally  RESPIRATORY:  Clear to auscultation without rales, wheezing or rhonchi. Normal WOB on room air   ABDOMEN: Soft, non-tender, non-distended EXTREMITIES:  Trace edema in BLE, more significant in R>L; No deformity   ASSESSMENT AND PLAN: .    HTN  Orthostatic Hypotension - Patient contacted the office on 1/22 concerned that his BP was elevated. Started on spironolactone, but developed leg pain so it was stopped - Per review of BP log, his HR has been elevated with SBP in the 130s-140s and DBP in the 80s-90s - Denies symptoms of hypotension or orthostatic hypotension- reports that this has not been an issue for a few years  - Patient wanting to retry amlodipine. In the past, he did have ankle swelling on amlodipine, but swelling occurred around the time he had multiple back surgeries. He is not convinced the medication alone caused his swelling  - Start amlodipine 2.5 mg daily. If he tolerates it without side effects, can titrate further as needed for BP support  - Continue carvedilol 6.25 mg BID, telmisartan 80 mg daily  -  HR in the 60s at home. Note, he was also ran and cycled for many years. Suspect he may have low resting HR to begin with. No dizziness, syncope, near syncope  - K 4.8, creatinine 0.71 on 11/23/23  HLD  - LDL 48 in 03/2023  - Continue lipitor 40 mg daily   Right Calf Pain  - Patient reports developing right calf pain/cramping about 3 weeks ago. Initially felt to be a side effect from spironolactone, so it was stopped. Symptoms did not improve with starting spironolactone  - He saw his PCP who ordered venous ultrasound on 2/7- showed no evidence of DVT  - Also saw ortho earlier today who ordered x-ray and MRI. Results pending  - Symptoms concerning for claudication- worse with exertion, improved with rest. Ordered ABIs and ultrasounds to rule out PAD    Dispo:  Follow up with Dr. Odis Hollingshead in 3 months   Signed, Jonita Albee, PA-C

## 2023-12-18 ENCOUNTER — Encounter: Payer: Self-pay | Admitting: Cardiology

## 2023-12-18 ENCOUNTER — Ambulatory Visit: Payer: Medicare Other | Attending: Cardiology | Admitting: Cardiology

## 2023-12-18 ENCOUNTER — Other Ambulatory Visit: Payer: Self-pay

## 2023-12-18 VITALS — BP 138/62 | HR 59 | Ht 65.0 in | Wt 152.2 lb

## 2023-12-18 DIAGNOSIS — I739 Peripheral vascular disease, unspecified: Secondary | ICD-10-CM | POA: Insufficient documentation

## 2023-12-18 DIAGNOSIS — R609 Edema, unspecified: Secondary | ICD-10-CM | POA: Insufficient documentation

## 2023-12-18 DIAGNOSIS — M4325 Fusion of spine, thoracolumbar region: Secondary | ICD-10-CM

## 2023-12-18 MED ORDER — AMLODIPINE BESYLATE 2.5 MG PO TABS: 2.5000 mg | ORAL_TABLET | Freq: Every day | ORAL | 3 refills | Status: DC

## 2023-12-18 NOTE — Addendum Note (Signed)
Addended by: Jonita Albee on: 12/18/2023 04:58 PM   Modules accepted: Orders

## 2023-12-18 NOTE — Patient Instructions (Signed)
Medication Instructions:  Start amlodipine 2.5 mg once a day  *If you need a refill on your cardiac medications before your next appointment, please call your pharmacy*  Lab Work: No labs  Testing/Procedures: Your physician has requested that you have a lower extremity arterial duplex. During this test, ultrasound is used to evaluate arterial blood flow in the legs. Allow one hour for this exam. There are no restrictions or special instructions. This will take place at [address] [facility], [address].  Please note: We ask at that you not bring children with you during ultrasound (echo/ vascular) testing. Due to room size and safety concerns, children are not allowed in the ultrasound rooms during exams. Our front office staff cannot provide observation of children in our lobby area while testing is being conducted. An adult accompanying a patient to their appointment will only be allowed in the ultrasound room at the discretion of the ultrasound technician under special circumstances. We apologize for any inconvenience.  Your physician has requested that you have an ankle brachial index (ABI). During this test an ultrasound and blood pressure cuff are used to evaluate the arteries that supply the arms and legs with blood. Allow thirty minutes for this exam. There are no restrictions or special instructions. This will take place at [address] [facility], [address].   Please note: We ask at that you not bring children with you during ultrasound (echo/ vascular) testing. Due to room size and safety concerns, children are not allowed in the ultrasound rooms during exams. Our front office staff cannot provide observation of children in our lobby area while testing is being conducted. An adult accompanying a patient to their appointment will only be allowed in the ultrasound room at the discretion of the ultrasound technician under special circumstances. We apologize for any inconvenience.  Follow-Up: At [facility], you and your health needs are our priority.  As part of our continuing mission to provide you with exceptional heart care, we have created designated Provider Care Teams.  These Care Teams include your primary Cardiologist (physician) and Advanced Practice Providers (APPs -  Physician Assistants and Nurse Practitioners) who all work together to provide you with the care you need, when you need it.  We recommend signing up for the patient portal called "MyChart".  Sign up information is provided on this After Visit Summary.  MyChart is used to connect with patients for Virtual Visits (Telemedicine).  Patients are able to view lab/test results, encounter notes, upcoming appointments, etc.  Non-urgent messages can be sent to your provider as well.   To learn more about what you can do with MyChart, go to [email or web address].    Your next appointment:   3 month(s)  Provider:   Tessa Lerner, DO

## 2023-12-21 ENCOUNTER — Other Ambulatory Visit: Payer: Self-pay | Admitting: Orthopedic Surgery

## 2023-12-21 DIAGNOSIS — M4325 Fusion of spine, thoracolumbar region: Secondary | ICD-10-CM

## 2024-01-15 ENCOUNTER — Ambulatory Visit: Payer: Medicare Other | Admitting: Podiatry

## 2024-01-15 ENCOUNTER — Encounter: Payer: Self-pay | Admitting: Podiatry

## 2024-01-15 ENCOUNTER — Ambulatory Visit (HOSPITAL_BASED_OUTPATIENT_CLINIC_OR_DEPARTMENT_OTHER)
Admission: RE | Admit: 2024-01-15 | Discharge: 2024-01-15 | Disposition: A | Discharge status: Home / Self Care | Payer: Medicare Other | Source: Ambulatory Visit | Attending: Cardiology | Admitting: Cardiology

## 2024-01-15 ENCOUNTER — Ambulatory Visit (HOSPITAL_COMMUNITY)
Admission: RE | Admit: 2024-01-15 | Discharge: 2024-01-15 | Disposition: A | Discharge status: Home / Self Care | Payer: Medicare Other | Source: Ambulatory Visit | Attending: Cardiology | Admitting: Cardiology

## 2024-01-15 DIAGNOSIS — B351 Tinea unguium: Secondary | ICD-10-CM | POA: Diagnosis not present

## 2024-01-15 DIAGNOSIS — R609 Edema, unspecified: Secondary | ICD-10-CM

## 2024-01-15 DIAGNOSIS — M79675 Pain in left toe(s): Secondary | ICD-10-CM | POA: Diagnosis not present

## 2024-01-15 DIAGNOSIS — I739 Peripheral vascular disease, unspecified: Secondary | ICD-10-CM

## 2024-01-15 DIAGNOSIS — M79674 Pain in right toe(s): Secondary | ICD-10-CM | POA: Diagnosis not present

## 2024-01-15 LAB — VAS US ABI WITH/WO TBI
Left ABI: 1.13
Right ABI: 1.1

## 2024-01-15 NOTE — Progress Notes (Signed)
This patient returns to the office for evaluation and treatment of long thick painful nails .  This patient is unable to trim his own nails since the patient cannot reach his feet.  Patient says the nails are painful walking and wearing his shoes.  He returns for preventive foot care services. ? ?General Appearance  Alert, conversant and in no acute stress. ? ?Vascular  Dorsalis pedis and posterior tibial  pulses are palpable  bilaterally.  Capillary return is within normal limits  bilaterally. Temperature is within normal limits  bilaterally. ? ?Neurologic  Senn-Weinstein monofilament wire test within normal limits  bilaterally. Muscle power within normal limits bilaterally. ? ?Nails Thick disfigured discolored nails with subungual debris  hallux nails bilaterally. No evidence of bacterial infection or drainage bilaterally. ? ?Orthopedic  No limitations of motion  feet .  No crepitus or effusions noted.  No bony pathology or digital deformities noted. ? ?Skin  normotropic skin with no porokeratosis noted bilaterally.  No signs of infections or ulcers noted.    ? ?Onychomycosis  Pain in toes right foot  Pain in toes left foot ? ?Debridement  of nails  1-5  B/L with a nail nipper.  Nails were then filed using a dremel tool with no incidents.    RTC  4 months  ? ? ?Gardiner Barefoot DPM   ?

## 2024-01-18 ENCOUNTER — Ambulatory Visit: Payer: Medicare Other | Admitting: Podiatry

## 2024-01-18 ENCOUNTER — Telehealth: Payer: Self-pay

## 2024-01-18 ENCOUNTER — Encounter (HOSPITAL_COMMUNITY): Payer: Medicare Other

## 2024-01-18 NOTE — Telephone Encounter (Signed)
 Called patient wife advised of below they verbalized understanding and will pass on the message

## 2024-01-18 NOTE — Telephone Encounter (Signed)
-----   Message from Jonita Albee sent at 01/18/2024  8:02 AM EDT ----- Please tell patient that his recent lower extremity ultrasounds showed normal blood flow in both of his lower legs. This is good news! No changes to treatment plan   Thanks KJ

## 2024-01-25 ENCOUNTER — Other Ambulatory Visit: Payer: Self-pay | Admitting: Physician Assistant

## 2024-01-25 DIAGNOSIS — M818 Other osteoporosis without current pathological fracture: Secondary | ICD-10-CM

## 2024-01-26 ENCOUNTER — Ambulatory Visit
Admission: RE | Admit: 2024-01-26 | Discharge: 2024-01-26 | Disposition: A | Discharge status: Home / Self Care | Payer: Medicare Other | Source: Ambulatory Visit | Attending: Orthopedic Surgery | Admitting: Orthopedic Surgery

## 2024-01-26 DIAGNOSIS — M4325 Fusion of spine, thoracolumbar region: Secondary | ICD-10-CM

## 2024-01-26 MED ORDER — GADOPICLENOL 0.5 MMOL/ML IV SOLN
7.0000 mL | Freq: Once | INTRAVENOUS | Status: AC | PRN
  Administered 2024-01-26: 7 mL via INTRAVENOUS

## 2024-01-29 ENCOUNTER — Ambulatory Visit
Admission: RE | Admit: 2024-01-29 | Discharge: 2024-01-29 | Disposition: A | Discharge status: Home / Self Care | Payer: Medicare Other | Source: Ambulatory Visit | Attending: Orthopedic Surgery | Admitting: Orthopedic Surgery

## 2024-01-29 DIAGNOSIS — M4325 Fusion of spine, thoracolumbar region: Secondary | ICD-10-CM

## 2024-01-29 MED ORDER — GADOPICLENOL 0.5 MMOL/ML IV SOLN
6.0000 mL | Freq: Once | INTRAVENOUS | Status: AC | PRN
  Administered 2024-01-29: 6 mL via INTRAVENOUS

## 2024-03-11 ENCOUNTER — Encounter: Payer: Self-pay | Admitting: Cardiology

## 2024-03-11 ENCOUNTER — Ambulatory Visit: Payer: Medicare Other | Attending: Cardiology | Admitting: Cardiology

## 2024-03-11 VITALS — BP 140/70 | HR 56 | Resp 16 | Ht 65.0 in | Wt 154.6 lb

## 2024-03-11 DIAGNOSIS — R9431 Abnormal electrocardiogram [ECG] [EKG]: Secondary | ICD-10-CM | POA: Insufficient documentation

## 2024-03-11 DIAGNOSIS — E782 Mixed hyperlipidemia: Secondary | ICD-10-CM | POA: Insufficient documentation

## 2024-03-11 DIAGNOSIS — I951 Orthostatic hypotension: Secondary | ICD-10-CM | POA: Insufficient documentation

## 2024-03-11 DIAGNOSIS — I10 Essential (primary) hypertension: Secondary | ICD-10-CM | POA: Diagnosis present

## 2024-03-11 NOTE — Progress Notes (Signed)
 Cardiology Office Note:  .   Date:  03/11/2024  ID:  Cody Gonzalez, DOB 12/23/1945, MRN [id number] PCP:  Alejandro Hurt, FNP  Former Cardiology Providers: None [facility] Providers Cardiologist:  Olinda Bertrand, DO , [facility] (established care 08/20/2022) Electrophysiologist:  None  Click to update primary MD,subspecialty MD or APP then REFRESH:1}    Chief Complaint  Patient presents with   Hypertension   Follow-up    History of Present Illness: .   Cody Gonzalez is a 78 y.o. Caucasian male whose past medical history and cardiovascular risk factors includes: Benign essential hypertension, mixed hyperlipidemia, gastritis/iron deficiency anemia, prostate cancer, chronic hyponatremia, Alzheimer's disease, advanced age.   Patient was referred to the practice for evaluation and management of orthostatic hypotension.  In the past we discontinued his vasodilator therapy and with medication titration his symptoms have improved significantly.  During his last office visit in November 2024 no medication changes were made.  In the interim patient called the office requesting medication assistance for elevated blood pressures.  He was started on spironolactone  but was later discontinued secondary to concerns for leg cramps.  Patient was evaluated by APP back in February 2025 his amatory blood pressure log at that time noted SBP ranging between 130-140 mmHg and DBP ranging between 80-90 mmHg.  He did not endorse any evidence of orthostasis.  Patient was started on amlodipine  2.5 mg p.o. daily and to monitor for any side effect such as swelling which she had in the past.  Given his right calf pain patient had undergone a DVT study with PCP which was negative for deep venous thrombosis as per prior progress notes.  He was requested to undergo PAD workup.  Bilateral ABIs were noted to be within normal limits.  Lower extremity arterial duplex noted triphasic flows bilaterally.  Patient  presents today for follow-up.  Patient is accompanied by his wife at today's office visit.  Since last office visit his blood pressures are better controlled since starting amlodipine  2.5 mg p.o. daily.  His blood pressure average for the last 30 days is 127/73 in the last 6 months is 140/79.  Patient's wife informs me that he was diagnosed with Alzheimer's but upon completion of the testing he does not have it.  He is currently being worked up for possible vascular dementia.  Patient denies any anginal chest pain or heart failure symptoms.  He continues to ambulate with a cane and does a stationary bike for at least 20 minutes every day without any exertional symptoms.  Review of Systems: .   Review of Systems  Cardiovascular:  Negative for chest pain, claudication, irregular heartbeat, leg swelling, near-syncope, orthopnea, palpitations, paroxysmal nocturnal dyspnea and syncope.  Respiratory:  Negative for shortness of breath.   Hematologic/Lymphatic: Negative for bleeding problem.    Studies Reviewed:   WUJ:WJXBJ bradycardia T wave abnormality, consider inferior ischemia When compared with ECG of 11-Sep-2023 08:39, T wave inversion now evident in Inferior leads Confirmed by Olinda Bertrand [phone]) on 03/11/2024 11:02:51 AM  Echocardiogram: March 2023: LVEF 60-65%, global longitudinal strain -22.9%, normal diastolic function Moderate LAE Mild MR Trivial TR Estimated RAP 3 mmHg  RADIOLOGY: NA  Risk Assessment/Calculations:   NA   Labs:       Latest Ref Rng & Units 10/19/2023   10:42 AM 10/13/2022    9:43 AM 08/13/2022    9:48 AM  CBC  WBC 4.0 - 10.5 K/uL 4.6  3.0  3.4   Hemoglobin  13.0 - 17.0 g/dL 13.0  86.5  78.4   Hematocrit 39.0 - 52.0 % 37.8  35.6  34.7   Platelets 150 - 400 K/uL 193  197  211        Latest Ref Rng & Units 12/03/2023    8:21 AM 10/19/2023   10:42 AM 10/13/2022    9:43 AM  BMP  Glucose 70 - 99 mg/dL 696  90  295   BUN 8 - 27 mg/dL 23  37  32    Creatinine 0.76 - 1.27 mg/dL 2.84  1.32  4.40   BUN/Creat Ratio 10 - 24 32     Sodium 134 - 144 mmol/L 130  130  130   Potassium 3.5 - 5.2 mmol/L 4.8  4.1  4.6   Chloride 96 - 106 mmol/L 91  95  95   CO2 20 - 29 mmol/L 21  30  31    Calcium  8.6 - 10.2 mg/dL 9.7  9.4  9.2       Latest Ref Rng & Units 12/03/2023    8:21 AM 10/19/2023   10:42 AM 10/13/2022    9:43 AM  CMP  Glucose 70 - 99 mg/dL 102  90  725   BUN 8 - 27 mg/dL 23  37  32   Creatinine 0.76 - 1.27 mg/dL 3.66  4.40  3.47   Sodium 134 - 144 mmol/L 130  130  130   Potassium 3.5 - 5.2 mmol/L 4.8  4.1  4.6   Chloride 96 - 106 mmol/L 91  95  95   CO2 20 - 29 mmol/L 21  30  31    Calcium  8.6 - 10.2 mg/dL 9.7  9.4  9.2   Total Protein 6.5 - 8.1 g/dL  6.7  6.7   Total Bilirubin <1.2 mg/dL  1.7  1.2   Alkaline Phos 38 - 126 U/L  54  113   AST 15 - 41 U/L  20  21   ALT 0 - 44 U/L  17  19     No results found for: "CHOL", "HDL", "LDLCALC", "LDLDIRECT", "TRIG", "CHOLHDL" No results for input(s): "LIPOA" in the last 8760 hours. No components found for: "NTPROBNP" No results for input(s): "PROBNP" in the last 8760 hours. No results for input(s): "TSH" in the last 8760 hours.  External Labs: Collected: May 2024 available in Care Everywhere. BUN 20, creatinine 0.64. Sodium 127 (chronically hyponatremia). AST ALT within normal limits  Physical Exam:    Today's Vitals   03/11/24 1041 03/11/24 1106  BP: (!) 150/76 (!) 140/70  Pulse: (!) 56   Resp: 16   SpO2: 95%   Weight: 154 lb 9.6 oz (70.1 kg)   Height: 5\' 5"  (1.651 m)    Body mass index is 25.73 kg/m. Wt Readings from Last 3 Encounters:  03/11/24 154 lb 9.6 oz (70.1 kg)  12/18/23 152 lb 3.2 oz (69 kg)  10/19/23 149 lb (67.6 kg)    Physical Exam  Constitutional: No distress. He appears chronically ill.  Walks w/ cane, hemodynamically stable  Neck: No JVD present.  Cardiovascular: Normal rate, regular rhythm, S1 normal and S2 normal. Exam reveals no gallop, no S3  and no S4.  No murmur heard. Pulmonary/Chest: Effort normal and breath sounds normal. No stridor. He has no wheezes. He has no rales.  Abdominal: Soft. Bowel sounds are normal. He exhibits no distension. There is no abdominal tenderness.  Musculoskeletal:        General:  No edema.     Cervical back: Neck supple.  Neurological: He is alert and oriented to person, place, and time. He has intact cranial nerves (2-12).  Skin: Skin is warm.   Impression & Recommendation(s):  Impression:   ICD-10-CM   1. Benign hypertension  I10 EKG 12-Lead    2. Orthostatic hypotension  I95.1     3. Mixed hyperlipidemia  E78.2     4. Nonspecific abnormal electrocardiogram (ECG) (EKG)  R94.31 ECHOCARDIOGRAM COMPLETE       Recommendation(s):  Benign hypertension Orthostatic hypotension History of orthostatic hypotension-resolved after titration of medical therapy. Currently on amlodipine  2.5 mg p.o. daily. Continue carvedilol  6.25 mg p.o. twice daily. Continue telmisartan  80 mg p.o. daily. Spironolactone  discontinued secondary to leg cramps. Ambulatory blood pressure log reviewed and 30-day and 180-day averages are within acceptable limits.  Mixed hyperlipidemia Currently on Lipitor 40 mg p.o. nightly. Cardiology following peripherally, managed by primary care provider.  Abnormal EKG: Routine 26-month follow-up EKG was ordered during today's office visit which notes subtle ST depressions in the inferior leads. Clinically patient denies anginal chest pain or heart failure symptoms. Overall functional capacity remains relatively stable. Recommended either continuing surveillance/follow-up versus at least a repeat echocardiogram to reevaluate LVEF and regional wall motion.  Patient and wife prefer the latter. Echo will be ordered to evaluate for structural heart disease and left ventricular systolic function.  As part of today's office visit reviewed the last progress note by [facility] APP, lower extremity  arterial duplex, lower extremity venous duplex, ankle-brachial index, EKG, medication reconciliation, ordering additional diagnostic workup, coordination of care.  Orders Placed:  Orders Placed This Encounter  Procedures   EKG 12-Lead   ECHOCARDIOGRAM COMPLETE    Standing Status:   Future    Expected Date:   03/18/2024    Expiration Date:   03/11/2025    Where should this test be performed:   [facility]    Does the patient weigh less than or greater than 250 lbs?:   Patient weighs less than 250 lbs    Perflutren DEFINITY (image enhancing agent) should be administered unless hypersensitivity or allergy exist:   Administer Perflutren    Reason for exam-Echo:   Other-Full Diagnosis List    Full ICD-10/Reason for Exam:   Abnormal EKG [276271]   Final Medication List:   No orders of the defined types were placed in this encounter.   There are no discontinued medications.    Current Outpatient Medications:    escitalopram (LEXAPRO) 10 MG tablet, Take 10 mg by mouth daily., Disp: , Rfl:    alendronate (FOSAMAX) 70 MG tablet, Take 70 mg by mouth once a week., Disp: , Rfl:    alfuzosin (UROXATRAL) 10 MG 24 hr tablet, Take 10 mg by mouth at bedtime., Disp: , Rfl:    amLODipine  (NORVASC ) 2.5 MG tablet, Take 1 tablet (2.5 mg total) by mouth daily., Disp: 90 tablet, Rfl: 3   ascorbic acid (VITAMIN C) 1000 MG tablet, Take 1,000 mg by mouth daily., Disp: , Rfl:    atorvastatin  (LIPITOR) 40 MG tablet, Take 40 mg by mouth at bedtime., Disp: , Rfl:    b complex vitamins capsule, Take 1 capsule by mouth daily., Disp: , Rfl:    calcium  carbonate (OSCAL) 1500 (600 Ca) MG TABS tablet, 2 tablets once daily, Disp: , Rfl:    carvedilol  (COREG ) 6.25 MG tablet, TAKE 1 TABLET BY MOUTH TWICE A DAY, Disp: 180 tablet, Rfl: 3  Cholecalciferol 50 MCG (2000 UT) CAPS, Take 2,000 Units by mouth daily., Disp: , Rfl:    ferrous sulfate  325 (65 FE) MG tablet, Take 325 mg by mouth daily with breakfast., Disp: ,  Rfl:    memantine (NAMENDA) 10 MG tablet, Take 1 tablet by mouth 2 (two) times daily., Disp: , Rfl:    Multiple Vitamins-Iron (MULTIVITAMIN/IRON PO), Take 1 tablet by mouth daily with breakfast., Disp: , Rfl:    telmisartan  (MICARDIS ) 80 MG tablet, TAKE 1 TABLET BY MOUTH DAILY, Disp: 90 tablet, Rfl: 3   UNABLE TO FIND, Take 15 mg by mouth daily. Med Name: Dollene Fresh, Disp: , Rfl:   Consent:   NA  Disposition:   1 year sooner if needed.  His questions and concerns were addressed to his satisfaction. He voices understanding of the recommendations provided during this encounter.    Signed, Awilda Bogus, [facility]  [facility]  03/11/2024 11:27 AM

## 2024-03-11 NOTE — Patient Instructions (Signed)
 Medication Instructions:  Your physician recommends that you continue on your current medications as directed. Please refer to the Current Medication list given to you today.  *If you need a refill on your cardiac medications before your next appointment, please call your pharmacy*  Lab Work: NONE If you have labs (blood work) drawn today and your tests are completely normal, you will receive your results only by: MyChart Message (if you have MyChart) OR A paper copy in the mail If you have any lab test that is abnormal or we need to change your treatment, we will call you to review the results.  Testing/Procedures: Echocardiogram Your physician has requested that you have an echocardiogram. Echocardiography is a painless test that uses sound waves to create images of your heart. It provides your doctor with information about the size and shape of your heart and how well your heart's chambers and valves are working. This procedure takes approximately one hour. There are no restrictions for this procedure. Please do NOT wear cologne, perfume, aftershave, or lotions (deodorant is allowed). Please arrive 15 minutes prior to your appointment time.  Please note: We ask at that you not bring children with you during ultrasound (echo/ vascular) testing. Due to room size and safety concerns, children are not allowed in the ultrasound rooms during exams. Our front office staff cannot provide observation of children in our lobby area while testing is being conducted. An adult accompanying a patient to their appointment will only be allowed in the ultrasound room at the discretion of the ultrasound technician under special circumstances. We apologize for any inconvenience.   Follow-Up: At [facility], you and your health needs are our priority.  As part of our continuing mission to provide you with exceptional heart care, our providers are all part of one team.  This team includes your primary  Cardiologist (physician) and Advanced Practice Providers or APPs (Physician Assistants and Nurse Practitioners) who all work together to provide you with the care you need, when you need it.  Your next appointment:   1 year(s)  Provider:   Olinda Bertrand, DO    We recommend signing up for the patient portal called "MyChart".  Sign up information is provided on this After Visit Summary.  MyChart is used to connect with patients for Virtual Visits (Telemedicine).  Patients are able to view lab/test results, encounter notes, upcoming appointments, etc.  Non-urgent messages can be sent to your provider as well.   To learn more about what you can do with MyChart, go to [email or web address].   Other Instructions

## 2024-04-10 ENCOUNTER — Emergency Department (HOSPITAL_BASED_OUTPATIENT_CLINIC_OR_DEPARTMENT_OTHER)
Admission: EM | Admit: 2024-04-10 | Discharge: 2024-04-11 | Disposition: A | Discharge status: Home / Self Care | Attending: Emergency Medicine | Admitting: Emergency Medicine

## 2024-04-10 ENCOUNTER — Other Ambulatory Visit: Payer: Self-pay

## 2024-04-10 ENCOUNTER — Encounter (HOSPITAL_BASED_OUTPATIENT_CLINIC_OR_DEPARTMENT_OTHER): Payer: Self-pay

## 2024-04-10 ENCOUNTER — Emergency Department (HOSPITAL_BASED_OUTPATIENT_CLINIC_OR_DEPARTMENT_OTHER)

## 2024-04-10 DIAGNOSIS — I110 Hypertensive heart disease with heart failure: Secondary | ICD-10-CM | POA: Insufficient documentation

## 2024-04-10 DIAGNOSIS — E871 Hypo-osmolality and hyponatremia: Secondary | ICD-10-CM | POA: Diagnosis not present

## 2024-04-10 DIAGNOSIS — Z79899 Other long term (current) drug therapy: Secondary | ICD-10-CM | POA: Insufficient documentation

## 2024-04-10 DIAGNOSIS — Z8546 Personal history of malignant neoplasm of prostate: Secondary | ICD-10-CM | POA: Diagnosis not present

## 2024-04-10 DIAGNOSIS — I509 Heart failure, unspecified: Secondary | ICD-10-CM | POA: Diagnosis not present

## 2024-04-10 DIAGNOSIS — R600 Localized edema: Secondary | ICD-10-CM | POA: Diagnosis not present

## 2024-04-10 LAB — CBC WITH DIFFERENTIAL/PLATELET
Abs Immature Granulocytes: 0.01 10*3/uL (ref 0.00–0.07)
Basophils Absolute: 0 10*3/uL (ref 0.0–0.1)
Basophils Relative: 0 %
Eosinophils Absolute: 0.1 10*3/uL (ref 0.0–0.5)
Eosinophils Relative: 2 %
HCT: 35.9 % — ABNORMAL LOW (ref 39.0–52.0)
Hemoglobin: 12.4 g/dL — ABNORMAL LOW (ref 13.0–17.0)
Immature Granulocytes: 0 %
Lymphocytes Relative: 15 %
Lymphs Abs: 0.6 10*3/uL — ABNORMAL LOW (ref 0.7–4.0)
MCH: 34.9 pg — ABNORMAL HIGH (ref 26.0–34.0)
MCHC: 34.5 g/dL (ref 30.0–36.0)
MCV: 101.1 fL — ABNORMAL HIGH (ref 80.0–100.0)
Monocytes Absolute: 0.5 10*3/uL (ref 0.1–1.0)
Monocytes Relative: 11 %
Neutro Abs: 3 10*3/uL (ref 1.7–7.7)
Neutrophils Relative %: 72 %
Platelets: 153 10*3/uL (ref 150–400)
RBC: 3.55 MIL/uL — ABNORMAL LOW (ref 4.22–5.81)
RDW: 12.4 % (ref 11.5–15.5)
WBC: 4.2 10*3/uL (ref 4.0–10.5)
nRBC: 0 % (ref 0.0–0.2)

## 2024-04-10 LAB — COMPREHENSIVE METABOLIC PANEL WITH GFR
ALT: 26 U/L (ref 0–44)
AST: 25 U/L (ref 15–41)
Albumin: 4.4 g/dL (ref 3.5–5.0)
Alkaline Phosphatase: 68 U/L (ref 38–126)
Anion gap: 14 (ref 5–15)
BUN: 30 mg/dL — ABNORMAL HIGH (ref 8–23)
CO2: 22 mmol/L (ref 22–32)
Calcium: 9.1 mg/dL (ref 8.9–10.3)
Chloride: 92 mmol/L — ABNORMAL LOW (ref 98–111)
Creatinine, Ser: 0.78 mg/dL (ref 0.61–1.24)
GFR, Estimated: >60 mL/min (ref >60)
Glucose, Bld: 91 mg/dL (ref 70–99)
Potassium: 4.2 mmol/L (ref 3.5–5.1)
Sodium: 128 mmol/L — ABNORMAL LOW (ref 135–145)
Total Bilirubin: 1 mg/dL (ref 0.0–1.2)
Total Protein: 6.9 g/dL (ref 6.5–8.1)

## 2024-04-10 LAB — PRO BRAIN NATRIURETIC PEPTIDE: Pro Brain Natriuretic Peptide: 255 pg/mL (ref <300.0)

## 2024-04-10 MED ORDER — CARVEDILOL 6.25 MG PO TABS
6.2500 mg | ORAL_TABLET | Freq: Once | ORAL | Status: AC
  Administered 2024-04-10: 6.25 mg via ORAL
  Filled 2024-04-10: qty 1

## 2024-04-10 NOTE — ED Triage Notes (Signed)
 Right ankle and leg swelling. Afebrile. Has had episodes similar to this before and was attributed to arthritis flare up.
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
## 2024-04-11 DIAGNOSIS — R600 Localized edema: Secondary | ICD-10-CM | POA: Diagnosis not present

## 2024-04-11 NOTE — Discharge Instructions (Signed)
 Thank you for allowing [place] to take care of you today.  We hope you begin feeling better soon.  To-Do: Please follow-up with your primary doctor. Please return to the Emergency Department or call 911 if you experience chest pain, shortness of breath, severe pain, severe fever, altered mental status, or have any reason to think that you need emergency medical care.  Thank you again.  Hope you feel better soon.  Department of Emergency Medicine [facility]

## 2024-04-13 ENCOUNTER — Other Ambulatory Visit (HOSPITAL_BASED_OUTPATIENT_CLINIC_OR_DEPARTMENT_OTHER): Payer: Self-pay

## 2024-04-20 ENCOUNTER — Ambulatory Visit (HOSPITAL_COMMUNITY)
Admission: RE | Admit: 2024-04-20 | Discharge: 2024-04-20 | Disposition: A | Discharge status: Home / Self Care | Source: Ambulatory Visit | Attending: Cardiology | Admitting: Cardiology

## 2024-04-20 DIAGNOSIS — R9431 Abnormal electrocardiogram [ECG] [EKG]: Secondary | ICD-10-CM | POA: Diagnosis not present

## 2024-04-20 LAB — ECHOCARDIOGRAM COMPLETE
Area-P 1/2: 3.33 cm2
P 1/2 time: 442 ms
S' Lateral: 3.5 cm

## 2024-04-23 ENCOUNTER — Ambulatory Visit: Payer: Self-pay | Admitting: Cardiology

## 2024-05-16 ENCOUNTER — Ambulatory Visit (INDEPENDENT_AMBULATORY_CARE_PROVIDER_SITE_OTHER): Admitting: Podiatry

## 2024-05-16 ENCOUNTER — Encounter: Payer: Self-pay | Admitting: Podiatry

## 2024-05-16 DIAGNOSIS — M79675 Pain in left toe(s): Secondary | ICD-10-CM | POA: Diagnosis not present

## 2024-05-16 DIAGNOSIS — M79674 Pain in right toe(s): Secondary | ICD-10-CM | POA: Diagnosis not present

## 2024-05-16 DIAGNOSIS — B351 Tinea unguium: Secondary | ICD-10-CM | POA: Diagnosis not present

## 2024-05-16 NOTE — Progress Notes (Signed)
 This patient returns to the office for evaluation and treatment of long thick painful nails .  This patient is unable to trim his own nails since the patient cannot reach his feet.  Patient says the nails are painful walking and wearing his shoes.  He returns for preventive foot care services.  General Appearance  Alert, conversant and in no acute stress.  Vascular  Dorsalis pedis and posterior tibial  pulses are palpable  bilaterally.  Capillary return is within normal limits  bilaterally. Temperature is within normal limits  bilaterally.  Neurologic  Senn-Weinstein monofilament wire test within normal limits  bilaterally. Muscle power within normal limits bilaterally.  Nails Thick disfigured discolored nails with subungual debris hallux nails   bilaterally. No evidence of bacterial infection or drainage bilaterally. Subungual hematoma third toe right foot.  Orthopedic  No limitations of motion  feet .  No crepitus or effusions noted.  No bony pathology or digital deformities noted.  Skin  normotropic skin with no porokeratosis noted bilaterally.  No signs of infections or ulcers noted.     Onychomycosis  Pain in toes right foot  Pain in toes left foot  Debridement  of nails  1-5  B/L with a nail nipper.  Nails were then filed using a dremel tool with no incidents.    RTC  4  months   Cordella Bold DPM

## 2024-07-28 ENCOUNTER — Other Ambulatory Visit (HOSPITAL_BASED_OUTPATIENT_CLINIC_OR_DEPARTMENT_OTHER): Payer: Self-pay

## 2024-07-28 MED ORDER — FLUZONE HIGH-DOSE 0.5 ML IM SUSY
0.5000 mL | PREFILLED_SYRINGE | Freq: Once | INTRAMUSCULAR | 0 refills | Status: AC
  Filled 2024-07-28: qty 0.5, 1d supply, fill #0

## 2024-07-28 MED ORDER — COMIRNATY 30 MCG/0.3ML IM SUSY
0.3000 mL | PREFILLED_SYRINGE | Freq: Once | INTRAMUSCULAR | 0 refills | Status: AC
  Filled 2024-07-28: qty 0.3, 1d supply, fill #0

## 2024-08-08 NOTE — Progress Notes (Unsigned)
 PATIENT: Cody Gonzalez DOB: 1946/10/03  REASON FOR VISIT: follow up HISTORY FROM: patient PRIMARY NEUROLOGIST: Dr. Chalice  No chief complaint on file.    HISTORY OF PRESENT ILLNESS: Today 08/08/24  Cody Gonzalez is a 78 y.o. male who has been followed in this office for OSA on CPAP.  His download indicates that he uses machine nightly for compliance of 100%.  He uses machine on average 7 hours and 51 minutes.  His residual AHI is 7.8 on 6 to 15 cm of water  with an EPR of 3.  He reports that the CPAP is working well.  He denies any new issues.  Patient is seeing orthopedist at [facility].  Has had 3 back surgeries in the last 2 years.  He reports today that he was out walking 2 days ago and he states he suddenly had a hard time moving his legs.  Denies any pain.  Denies any numbness or tingling down the legs.  He states that it felt like he had to think more about moving his legs.  He was able to walk back to his car.  States that he went home was able to make himself lunch and rested.  He gait is back to baseline.  Has not had any additional symptoms.  He reports that his gait is always slightly unsteady since he has had back surgeries.  Often tripping over the left toe.  He uses a cane when ambulating unless it is for short distances.  Does physical therapy once a week.      REVIEW OF SYSTEMS: Out of a complete 14 system review of symptoms, the patient complains only of the following symptoms, and all other reviewed systems are negative.  ALLERGIES: Allergies  Allergen Reactions   Nifedipine  Swelling   Amlodipine  Besylate Swelling and Other (See Comments)    Ankles swell    HOME MEDICATIONS: Outpatient Medications Prior to Visit  Medication Sig Dispense Refill   alendronate (FOSAMAX) 70 MG tablet Take 70 mg by mouth once a week.     alfuzosin (UROXATRAL) 10 MG 24 hr tablet Take 10 mg by mouth at bedtime.     amLODipine  (NORVASC ) 2.5 MG tablet Take 1 tablet (2.5  mg total) by mouth daily. 90 tablet 3   ascorbic acid (VITAMIN C) 1000 MG tablet Take 1,000 mg by mouth daily.     atorvastatin  (LIPITOR) 40 MG tablet Take 40 mg by mouth at bedtime.     b complex vitamins capsule Take 1 capsule by mouth daily.     calcium  carbonate (OSCAL) 1500 (600 Ca) MG TABS tablet 2 tablets once daily     carvedilol  (COREG ) 6.25 MG tablet TAKE 1 TABLET BY MOUTH TWICE A DAY 180 tablet 3   Cholecalciferol 50 MCG (2000 UT) CAPS Take 2,000 Units by mouth daily.     escitalopram (LEXAPRO) 10 MG tablet Take 10 mg by mouth daily.     ferrous sulfate  325 (65 FE) MG tablet Take 325 mg by mouth daily with breakfast.     memantine (NAMENDA) 10 MG tablet Take 1 tablet by mouth 2 (two) times daily.     Multiple Vitamins-Iron (MULTIVITAMIN/IRON PO) Take 1 tablet by mouth daily with breakfast.     telmisartan  (MICARDIS ) 80 MG tablet TAKE 1 TABLET BY MOUTH DAILY 90 tablet 3   UNABLE TO FIND Take 15 mg by mouth daily. Med Name: UreAide     No facility-administered medications prior to visit.  PAST MEDICAL HISTORY: Past Medical History:  Diagnosis Date   Anemia    Hypertension    Prostate cancer (HCC)    Sleep apnea    uses cpap    TBI (traumatic brain injury) (HCC) 2015    PAST SURGICAL HISTORY: Past Surgical History:  Procedure Laterality Date   BACK SURGERY     BASAL CELL CARCINOMA EXCISION     BILATERAL CARPAL TUNNEL RELEASE  2014   CYSTOSCOPY N/A 06/29/2019   Procedure: CYSTOSCOPY;  Surgeon: Alvaro Hummer, MD;  Location: [facility];  Service: Urology;  Laterality: N/A;  No seeds in bladder per Dr. Alvaro ILES LAMINECTOMY/DECOMPRESSION MICRODISCECTOMY Left 05/28/2021   Procedure: Left  - Lumbar five-Sacral one Laminectomy resection of synovial cyst;  Surgeon: Louis Shove, MD;  [facility];  Service: Neurosurgery;  Laterality: Left;   PROSTATE BIOPSY     RADIOACTIVE SEED IMPLANT N/A 06/29/2019   Procedure: RADIOACTIVE SEED  IMPLANT/BRACHYTHERAPY IMPLANT;  Surgeon: Alvaro Hummer, MD;  Location: [name];  Service: Urology;  Laterality: N/A;  90 MINS   SPACE OAR INSTILLATION N/A 06/29/2019   Procedure: SPACE OAR INSTILLATION;  Surgeon: Alvaro Hummer, MD;  Location: [facility];  Service: Urology;  Laterality: N/A;    FAMILY HISTORY: Family History  Problem Relation Age of Onset   Brain cancer Father        GBM   Bell's palsy Neg Hx    Sleep apnea Neg Hx     SOCIAL HISTORY: Social History   Socioeconomic History   Marital status: Married    Spouse name: Not on file   Number of children: 1   Years of education: Not on file   Highest education level: Not on file  Occupational History   Occupation: Charity fundraiser    Comment: retired  Tobacco Use   Smoking status: Never   Smokeless tobacco: Never  Vaping Use   Vaping status: Never Used  Substance and Sexual Activity   Alcohol use: Yes    Alcohol/week: 7.0 standard drinks of alcohol    Types: 7 Glasses of wine per week    Comment: Wine/Dinner   Drug use: No   Sexual activity: Yes    Comment: with aid of sildenafil  Other Topics Concern   Not on file  Social History Narrative   ** Merged History Encounter **       Married. 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 Preferred name: Rod. Enjoys cycling.   Social Drivers of Corporate investment banker Strain: Low Risk  (08/01/2024)   Received from [facility]   Overall Financial Resource Strain (CARDIA)    Difficulty of Paying Living Expenses: Not hard at all  Food Insecurity: No Food Insecurity (08/01/2024)   Received from [facility]   Hunger Vital Sign    Within the past 12 months, you worried that your food would run out before you got the money to buy more.: Never true    Within the past 12 months, the food you bought just didn't last and you didn't have money to get more.: Never true   Transportation Needs: No Transportation Needs (08/01/2024)   Received from [facility] - Transportation    In the past 12 months, has lack of transportation kept you from medical appointments or from getting medications?: No    Lack of Transportation (Non-Medical): No  Physical  Activity: Insufficiently Active (08/01/2024)   Received from [facility]   Exercise Vital Sign    On average, how many days per week do you engage in moderate to strenuous exercise (like a brisk walk)?: 1 day    On average, how many minutes do you engage in exercise at this level?: 60 min  Stress: No Stress Concern Present (08/01/2024)   Received from [facility] - Occupational Stress Questionnaire    Feeling of Stress : Not at all  Social Connections: Moderately Integrated (08/01/2024)   Received from [facility]   Social Connection and Isolation Panel    In a typical week, how many times do you talk on the phone with family, friends, or neighbors?: More than three times a week    How often do you get together with friends or relatives?: More than three times a week    How often do you attend church or religious services?: Never    Do you belong to any clubs or organizations such as church groups, unions, fraternal or athletic groups, or school groups?: Yes    How often do you attend meetings of the clubs or organizations you belong to?: More than 4 times per year    Are you married, widowed, divorced, separated, never married, or living with a partner?: Married  Intimate Partner Violence: Not on file      PHYSICAL EXAM  There were no vitals filed for this visit.  There is no height or weight on file to calculate BMI.  Generalized: Well developed, in no acute distress   Neurological examination  Mentation: Alert oriented to time, place, history taking. Follows all commands speech and  language fluent Cranial nerve II-XII: Pupils were equal round reactive to light. Extraocular movements were full, visual field were full on confrontational test. Facial sensation and strength were normal. Uvula tongue midline. Head turning and shoulder shrug  were normal and symmetric. Motor: The motor testing reveals 5 over 5 strength of all 4 extremities. Good symmetric motor tone is noted throughout.  Sensory: Sensory testing is intact to soft touch on all 4 extremities. No evidence of extinction is noted.  Coordination: Cerebellar testing reveals good finger-nose-finger and heel-to-shin bilaterally.  Gait and station: Gait is slightly unsteady.  Uses a cane when ambulating.  2 times  his left foot caught on the floor.   DIAGNOSTIC DATA (LABS, IMAGING, TESTING) - I reviewed patient records, labs, notes, testing and imaging myself where available.  Lab Results  Component Value Date   WBC 4.2 04/10/2024   HGB 12.4 (L) 04/10/2024   HCT 35.9 (L) 04/10/2024   MCV 101.1 (H) 04/10/2024   PLT 153 04/10/2024      Component Value Date/Time   NA 128 (L) 04/10/2024 1943   NA 130 (L) 12/03/2023 0821   K 4.2 04/10/2024 1943   CL 92 (L) 04/10/2024 1943   CO2 22 04/10/2024 1943   GLUCOSE 91 04/10/2024 1943   BUN 30 (H) 04/10/2024 1943   BUN 23 12/03/2023 0821   CREATININE 0.78 04/10/2024 1943   CREATININE 0.77 10/19/2023 1042   CALCIUM  9.1 04/10/2024 1943   PROT 6.9 04/10/2024 1943   ALBUMIN 4.4 04/10/2024 1943   AST 25 04/10/2024 1943   AST 20 10/19/2023 1042   ALT 26 04/10/2024 1943   ALT 17 10/19/2023 1042   ALKPHOS 68 04/10/2024 1943   BILITOT 1.0 04/10/2024 1943  BILITOT 1.7 (H) 10/19/2023 1042   GFRNONAA >60 04/10/2024 1943   GFRNONAA >60 10/19/2023 1042   GFRAA >60 06/27/2019 1209      ASSESSMENT AND PLAN 78 y.o. year old male  has a past medical history of Anemia, Hypertension, Prostate cancer (HCC), Sleep apnea, and TBI (traumatic brain injury) (HCC) (2015). here  with:  1.  Obstructive sleep apnea on CPAP  -Good compliance -Good treatment of apnea -Encouraged the patient to use CPAP nightly and greater than 4 hours each night  2.  Abnormality of gait  -Patient is back to his baseline.  I encouraged him to reach out to his orthopedist and make them aware of this episode.  - encouraged him to continue with PT - Did advise that if he wanted me to ordering imaging- I would. However he deferred and said he would monitor for now.   FU in 1 year for CPAP     Duwaine Russell, MSN, NP-C 08/08/2024, 3:21 PM [facility] [address], [address] [place], [place] [address] [phone]

## 2024-08-09 ENCOUNTER — Encounter: Payer: Self-pay | Admitting: Adult Health

## 2024-08-09 ENCOUNTER — Ambulatory Visit (INDEPENDENT_AMBULATORY_CARE_PROVIDER_SITE_OTHER): Admitting: Adult Health

## 2024-08-09 VITALS — BP 141/79 | HR 73 | Ht 64.0 in | Wt 153.0 lb

## 2024-08-09 DIAGNOSIS — G4733 Obstructive sleep apnea (adult) (pediatric): Secondary | ICD-10-CM

## 2024-08-09 NOTE — Patient Instructions (Signed)
 Continue using CPAP nightly and greater than 4 hours each night If your symptoms worsen or you develop new symptoms please let us  know.

## 2024-08-10 NOTE — Progress Notes (Signed)
Cpap supply order sent to [facility].

## 2024-08-11 ENCOUNTER — Ambulatory Visit: Payer: Medicare Other | Admitting: Adult Health

## 2024-08-11 ENCOUNTER — Encounter: Payer: Self-pay | Admitting: Adult Health

## 2024-08-11 NOTE — Progress Notes (Signed)
 New, Maryella Shivers, Otilio Jefferson, RN; Alain Honey; Jeris Penta, [place]; 1 other Received, Thank you!

## 2024-08-13 ENCOUNTER — Ambulatory Visit
Admission: RE | Admit: 2024-08-13 | Discharge: 2024-08-13 | Disposition: A | Discharge status: Home / Self Care | Source: Ambulatory Visit | Attending: Internal Medicine

## 2024-08-13 VITALS — BP 139/70 | HR 68 | Temp 98.3°F | Resp 18

## 2024-08-13 DIAGNOSIS — R310 Gross hematuria: Secondary | ICD-10-CM

## 2024-08-13 DIAGNOSIS — N3001 Acute cystitis with hematuria: Secondary | ICD-10-CM

## 2024-08-13 DIAGNOSIS — R39198 Other difficulties with micturition: Secondary | ICD-10-CM

## 2024-08-13 LAB — POCT URINE DIPSTICK
Glucose, UA: NEGATIVE mg/dL
Nitrite, UA: NEGATIVE
POC PROTEIN,UA: >=300 — AB
Spec Grav, UA: 1.015 (ref 1.010–1.025)
Urobilinogen, UA: 1 U/dL
pH, UA: 7 (ref 5.0–8.0)

## 2024-08-13 MED ORDER — SULFAMETHOXAZOLE-TRIMETHOPRIM 800-160 MG PO TABS: 1.0000 | ORAL_TABLET | Freq: Two times a day (BID) | ORAL | 0 refills | Status: AC

## 2024-08-13 NOTE — ED Triage Notes (Signed)
 Pt reports difficulty urinating and blood in urine since last night. Pt was seeing by urologist on 08/10/24 and urine was clear at the urologist.
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
## 2024-08-13 NOTE — Discharge Instructions (Addendum)
 Urinalysis done today is consistent with a urinary tract infection.  Some of the blood in the urine may be secondary to the self-catheterization.  Given the symptoms and the urinalysis we will treat with the following: Sulfamethoxazole/trimethoprim (Bactrim DS) 800/160 mg 1 tablet twice daily for 5 days.  This is an antibiotic. Make sure to stay hydrated by drinking plenty of water .  If symptoms or not improving in the next 2 to 3 days may want to return to urgent care or your neurologist for further evaluation. If symptoms worsen then return sooner.

## 2024-08-31 NOTE — Care Plan (Signed)
  Problem: Ineffective Self-Management of Dementia/Alzheimer's Goal: Verbalize appropriate home care for patient [facility]) Description: Patient's SmartGoal Patient will receive information about dementia stages, progression and care needs, by expected end date Outcome: Not Met/ Continue Goal Note: Patient's wife will reach out to [facility] Dementia Family Support Program Intervention: Educate on local social support systems/groups Description: CM will send information about dementia in MyChart Note: CM encouraged patient's wife to reach out to [facility] Dementia Family Support Program for additional resources   Problem: Need for Advance Directives Goal: Discuss advance care planning and verbalize understanding of importance for medical care (DWAD) Description: Patient's SmartGoal Patient/wife will verbalize understanding of process to have HCPOA added to [facility] chart by expected end date Outcome: Met/ Completed Intervention: Encourage patient/caregiver to share AD documents with PCP or HIM Description: CM will send information in MyChart about how to have HCPOA added to [facility] chart

## 2024-08-31 NOTE — Progress Notes (Signed)
 [facility]  Care Management -  Follow-Up Engagement  Purpose: A [facility] completed a phone call to address follow-up care needs.  Cody Gonzalez is being managed for the following: Dementia.  The patient's wife/HCPOA states : They have uploaded HCPOA to patient's [facility] chart Patient's wife is enrolled in a 6 week caregiver course in [place].  She reports that she attends weekly classes for caregivers She would like additional information about what to expect at each stage of dementia. She has been in touch with the [facility] in the past  Assessment and Intervention of Needs: CM reviewed chart CM and patient's wife discussed dementia diagnosis and caregiver support/education programs offered by the [facility] Dementia Family Support Program. CM encouraged her to reach out to this program again.   CM provided education regarding [facility] Care Management program and criteria for enrollment in [facility] Dementia Care Management program.  CM explained that patient does not live in service area for DW Dementia Care Management.   CM will check in again in 1 month to eval for additional needs at that time.    Plan: Care Plan Discussed - Yes - Cody Gonzalez has agreed to participate with the identified care plan that was discussed on 08/31/2024. Please see Plan of Care in the Notes section of Chart Review  Next follow up appointment date: 09/26/2024 Cody Gonzalez    [address]; [place], [place] [address] l  [email or web address] l [phone]   For more information on [facility] services, click here.

## 2024-09-01 ENCOUNTER — Encounter (HOSPITAL_COMMUNITY): Payer: Self-pay

## 2024-09-01 ENCOUNTER — Emergency Department (HOSPITAL_COMMUNITY)

## 2024-09-01 ENCOUNTER — Other Ambulatory Visit: Payer: Self-pay

## 2024-09-01 ENCOUNTER — Emergency Department (HOSPITAL_COMMUNITY): Admission: EM | Admit: 2024-09-01 | Discharge: 2024-09-01 | Disposition: A | Discharge status: Home / Self Care

## 2024-09-01 DIAGNOSIS — Z79899 Other long term (current) drug therapy: Secondary | ICD-10-CM | POA: Diagnosis not present

## 2024-09-01 DIAGNOSIS — I951 Orthostatic hypotension: Secondary | ICD-10-CM | POA: Diagnosis present

## 2024-09-01 DIAGNOSIS — I950 Idiopathic hypotension: Secondary | ICD-10-CM | POA: Diagnosis not present

## 2024-09-01 DIAGNOSIS — E871 Hypo-osmolality and hyponatremia: Secondary | ICD-10-CM | POA: Insufficient documentation

## 2024-09-01 LAB — CBC
HCT: 33.8 % — ABNORMAL LOW (ref 39.0–52.0)
Hemoglobin: 11.9 g/dL — ABNORMAL LOW (ref 13.0–17.0)
MCH: 35.3 pg — ABNORMAL HIGH (ref 26.0–34.0)
MCHC: 35.2 g/dL (ref 30.0–36.0)
MCV: 100.3 fL — ABNORMAL HIGH (ref 80.0–100.0)
Platelets: 186 K/uL (ref 150–400)
RBC: 3.37 MIL/uL — ABNORMAL LOW (ref 4.22–5.81)
RDW: 12.3 % (ref 11.5–15.5)
WBC: 3.9 K/uL — ABNORMAL LOW (ref 4.0–10.5)
nRBC: 0 % (ref 0.0–0.2)

## 2024-09-01 LAB — I-STAT CHEM 8, ED
BUN: 38 mg/dL — ABNORMAL HIGH (ref 8–23)
Calcium, Ion: 1.15 mmol/L (ref 1.15–1.40)
Chloride: 93 mmol/L — ABNORMAL LOW (ref 98–111)
Creatinine, Ser: 0.9 mg/dL (ref 0.61–1.24)
Glucose, Bld: 98 mg/dL (ref 70–99)
HCT: 34 % — ABNORMAL LOW (ref 39.0–52.0)
Hemoglobin: 11.6 g/dL — ABNORMAL LOW (ref 13.0–17.0)
Potassium: 4.5 mmol/L (ref 3.5–5.1)
Sodium: 129 mmol/L — ABNORMAL LOW (ref 135–145)
TCO2: 25 mmol/L (ref 22–32)

## 2024-09-01 LAB — TROPONIN I (HIGH SENSITIVITY)
Troponin I (High Sensitivity): 13 ng/L (ref <18)
Troponin I (High Sensitivity): 11 ng/L (ref <18)

## 2024-09-01 NOTE — ED Provider Triage Note (Signed)
 Emergency Medicine Provider Triage Evaluation Note  Cody Gonzalez , a 78 y.o. male  was evaluated in triage.  Patient was being seen at his primary doctors when had a near syncopal/felt lightheaded.  Was orthostatic with EMS received fluids prior to arrival.  He is no longer feeling lightheaded.  Denies chest pain or palpitations.  Review of Systems  Positive: Lightheadedness Negative: Chest pain, shortness of breath  Physical Exam  BP (!) 129/58 (BP Location: Right Arm)   Pulse (!) 49   Temp 98.4 F (36.9 C) (Oral)   Resp 16   SpO2 99%  Gen:   Awake, no distress   Resp:  Normal effort; clear lungs  MSK:   Moves extremities without difficulty  Other:  Bradycardia, equal pulses. No LE edema  Medical Decision Making  Medically screening exam initiated at 11:16 AM.  Appropriate orders placed.  Cody Gonzalez was informed that the remainder of the evaluation will be completed by another provider, this initial triage assessment does not replace that evaluation, and the importance of remaining in the ED until their evaluation is complete.  Lightheadedness at PCPs office.  EMS reported positive orthostatics gave 500 cc IV fluids prior to arrival.  Patient currently asymptomatic and has no complaints.  Heart rate 49, he notes that his heart rate does typically run low.  Will get screening labs, EKG chest x-ray.   Neysa Caron PARAS, DO 09/01/24 1116

## 2024-09-01 NOTE — ED Notes (Signed)
 Pt gives verbal consent for MSE

## 2024-09-01 NOTE — ED Provider Notes (Signed)
 [facility] DEPART[facility] AT [facility] Provider Note   CSN: [id number] Arrival date & time: 09/01/24  1102     Patient presents with: Hypotension   Cody Gonzalez is a 78 y.o. male.   HPI    Patient presents because of episode of hypotension.  Patient as well as wife states that this is how happened twice in the past 2 weeks.  First time to happen while at nephrologist about 2 weeks ago.  Happened again today while at orthopedic office.  Patient was sitting there whenever wife noticed that he was looking a little pale.  Took patient's blood pressure.  Systolics was in the 80s.  Subsequently laid the patient down.  Sat back up and subsequently dropped his blood pressure again slightly.  Patient had a normal amount of p.o. intake today before going to the doctor's office.  Only recent medication changes as added on finasteride by urology about 2 weeks ago.  Also added on tizanidine about 10 to 11 days ago.  Patient does not member any kind of obvious symptoms before or after the event.  Maybe felt like a little lightheaded before this happened.  No chest pain or shortness of breath before or after.  No vertigo.  No diplopia.  No numbness or tingling anywhere.  No falls.    Previous medical history reviewed : Patient was last seen in the ED on August 13, 2024 because of urinary frequency.  Last sodium checked was around 129 130.  Patient was last seen by cardiology in May 2025.  Was seen in the setting of management of orthostatic hypotension.  Patient last EF back in June 2025 showed 60 to 65% EF.    Prior to Admission medications   Medication Sig Start Date End Date Taking? Authorizing Provider  alendronate (FOSAMAX) 70 MG tablet Take 70 mg by mouth once a week. 08/11/23   [provider]  alfuzosin (UROXATRAL) 10 MG 24 hr tablet Take 10 mg by mouth at bedtime.    [provider]  amLODipine  (NORVASC ) 2.5 MG tablet Take 1 tablet (2.5 mg total)  by mouth daily. 12/18/23 03/17/24  Vicci Rollo SAUNDERS, PA-C  ascorbic acid (VITAMIN C) 1000 MG tablet Take 1,000 mg by mouth daily.    [provider]  atorvastatin  (LIPITOR) 40 MG tablet Take 40 mg by mouth at bedtime. 09/01/19   [provider]  b complex vitamins capsule Take 1 capsule by mouth daily.    [provider]  calcium  carbonate (OSCAL) 1500 (600 Ca) MG TABS tablet 2 tablets once daily    [provider]  carvedilol  (COREG ) 6.25 MG tablet TAKE 1 TABLET BY MOUTH TWICE A DAY 09/30/23   Tolia, Sunit, DO  Cholecalciferol 50 MCG (2000 UT) CAPS Take 2,000 Units by mouth daily.    [provider]  escitalopram (LEXAPRO) 10 MG tablet Take 10 mg by mouth daily. 01/26/24 01/20/25  [provider]  ferrous sulfate  325 (65 FE) MG tablet Take 325 mg by mouth daily with breakfast.    [provider]  memantine (NAMENDA) 10 MG tablet Take 1 tablet by mouth 2 (two) times daily. 01/31/24 01/30/25  [provider]  Multiple Vitamins-Iron (MULTIVITAMIN/IRON PO) Take 1 tablet by mouth daily with breakfast.    [provider]  telmisartan  (MICARDIS ) 80 MG tablet TAKE 1 TABLET BY MOUTH DAILY 12/14/23   Tolia, Sunit, DO  UNABLE TO FIND Take 15 mg by mouth daily. Med Name: UreAide  [provider]    Allergies: Nifedipine  and Amlodipine  besylate    Review of Systems  Constitutional:  Negative for chills and fever.  HENT:  Negative for ear pain and sore throat.   Eyes:  Negative for pain and visual disturbance.  Respiratory:  Negative for cough and shortness of breath.   Cardiovascular:  Negative for chest pain and palpitations.  Gastrointestinal:  Negative for abdominal pain and vomiting.  Genitourinary:  Negative for dysuria and hematuria.  Musculoskeletal:  Negative for arthralgias and back pain.  Skin:  Negative for color change and rash.  Neurological:  Negative for seizures and syncope.  All other systems  reviewed and are negative.   Updated Vital Signs BP (!) 179/69 (BP Location: Right Arm)   Pulse (!) 57   Temp 97.7 F (36.5 C) (Oral)   Resp 19   Ht 5' 4 (1.626 m)   Wt 68 kg   SpO2 95%   BMI 25.75 kg/m   Physical Exam Vitals and nursing note reviewed.  Constitutional:      General: He is not in acute distress.    Appearance: He is well-developed.  HENT:     Head: Normocephalic and atraumatic.  Eyes:     Conjunctiva/sclera: Conjunctivae normal.  Cardiovascular:     Rate and Rhythm: Normal rate and regular rhythm.     Heart sounds: No murmur heard. Pulmonary:     Effort: Pulmonary effort is normal. No respiratory distress.     Breath sounds: Normal breath sounds.  Abdominal:     Palpations: Abdomen is soft.     Tenderness: There is no abdominal tenderness.  Musculoskeletal:        General: No swelling.     Cervical back: Neck supple.  Skin:    General: Skin is warm and dry.     Capillary Refill: Capillary refill takes less than 2 seconds.  Neurological:     Mental Status: He is alert.  Psychiatric:        Mood and Affect: Mood normal.     (all labs ordered are listed, but only abnormal results are displayed) Labs Reviewed  CBC - Abnormal; Notable for the following components:      Result Value   WBC 3.9 (*)    RBC 3.37 (*)    Hemoglobin 11.9 (*)    HCT 33.8 (*)    MCV 100.3 (*)    MCH 35.3 (*)    All other components within normal limits  I-STAT CHEM 8, ED - Abnormal; Notable for the following components:   Sodium 129 (*)    Chloride 93 (*)    BUN 38 (*)    Hemoglobin 11.6 (*)    HCT 34.0 (*)    All other components within normal limits  TROPONIN I (HIGH SENSITIVITY)  TROPONIN I (HIGH SENSITIVITY)    EKG: EKG Interpretation Date/Time:  Thursday September 01 2024 14:50:31 EDT Ventricular Rate:  54 PR Interval:  200 QRS Duration:  92 QT Interval:  436 QTC Calculation: 414 R Axis:   50  Text Interpretation: Sinus rhythm Minimal ST elevation,  anterior leads Confirmed by Simon Rea [phone]) on 09/01/2024 3:26:47 PM  Radiology: DG Chest 2 View Result Date: 09/01/2024 EXAM: 2 VIEW(S) XRAY OF THE CHEST 09/01/2024 11:45:00 AM COMPARISON: 04/10/2024 CLINICAL HISTORY: near syncope FINDINGS: LUNGS AND PLEURA: No focal pulmonary opacity. No pulmonary edema. No pleural effusion. No pneumothorax. HEART AND MEDIASTINUM: No acute abnormality of the cardiac and mediastinal silhouettes. Aortic atherosclerosis.  BONES AND SOFT TISSUES: Spine fixation hardware. Multilevel vertebroplasty. Cervical fixation hardware. IMPRESSION: 1. No acute cardiopulmonary abnormality identified. Electronically signed by: Waddell Calk MD 09/01/2024 12:42 PM EDT RP Workstation: [id number]     Procedures   Medications Ordered in the ED - No data to display                                  Medical Decision Making    HPI:   Patient presents because of episode of hypotension.  Patient as well as wife states that this is how happened twice in the past 2 weeks.  First time to happen while at nephrologist about 2 weeks ago.  Happened again today while at orthopedic office.  Patient was sitting there whenever wife noticed that he was looking a little pale.  Took patient's blood pressure.  Systolics was in the 80s.  Subsequently laid the patient down.  Sat back up and subsequently dropped his blood pressure again slightly.  Patient had a normal amount of p.o. intake today before going to the doctor's office.  Only recent medication changes as added on finasteride by urology about 2 weeks ago.  Also added on tizanidine about 10 to 11 days ago.  Patient does not member any kind of obvious symptoms before or after the event.  Maybe felt like a little lightheaded before this happened.  No chest pain or shortness of breath before or after.  No vertigo.  No diplopia.  No numbness or tingling anywhere.  No falls.    Previous medical history reviewed : Patient was last seen in the  ED on August 13, 2024 because of urinary frequency.  Last sodium checked was around 129 130.   MDM:   Will obtain laboratory workup including electrolyte workup as well as CBC to assess for any kind of significant anemia.  Obtain troponin and cardiac workup as well.  No indication for imaging at this time.  Patient is neuro intact.  Will also obtain orthostatic vital signs.  Patient received fluids though so would likely not be reproducible.   Reevaluation:   Upon reexamination, patient hemodynamically stable.  Remains A&O x 3 with GCS 15.   Sodium level 129.  Is around patient's baseline.  No AKI.  Potassium 4.5.  No significant anemia.  Hemoglobin 11.9 which is around patient's baseline.  EKG sinus rhythm as well as sinus bradycardia.  Troponin negative x 2.  No delta change.  No chest pain or shortness of breath.  No concerns for ACS event.  Orthostatics here was negative.  Granted, patient already had bolus of fluid by EMS.  Patient follows of cardiology for orthostatic hypotension.  Question whether or not this could be repeat orthostatic hypotension.  Does not sound like patient was actively standing up but was sitting.  I did keep patient on cardiac telemetry throughout the ED stay for over 4 hours without any kind of significant arrhythmia.  He did have some episodes of sinus bradycardia into the 50s but this is his baseline.  Asymptomatic.   Patient is on tizanidine.  Recommend discontinue this.  Could contribute.  Also on Coreg  as well as small dose amlodipine .  Recommend possible holding amlodipine  and write down his blood pressure 2 times per day to see what his blood pressure does.  They will follow-up with cardiology who manages this as well which is reasonable.  Patient remained neuro intact at baseline.  Discharged in stable addition.   EKG Interpreted by Me: sinus   Cardiac Tele Interpreted by Me: sinus    I have independently interpreted the CXR    Social  Determinant of Health: Dementia     Disposition and Follow Up: cardiology      Final diagnoses:  Orthostatic hypotension  Idiopathic hypotension  Hyponatremia    ED Discharge Orders     None          Simon Lavonia SAILOR, MD 09/01/24 [id number]

## 2024-09-01 NOTE — Discharge Instructions (Addendum)
 I would hold on any further tizanidine. Patients can sometimes have a drop in blood pressure with this medication.   Another option is to hold his amlodipine  and document his blood pressures going forward.  If his blood pressure remains normal during the day then can potentially withhold amlodipine  going forward.  I do want her to follow-up with cardiology.  They have been managing his orthostatic hypotension in the past.  Please give them a call to get back into the clinic.

## 2024-09-01 NOTE — ED Notes (Signed)
 Pt tolerated standing well. No complaints of dizziness or lightheaded.

## 2024-09-01 NOTE — ED Triage Notes (Signed)
 Pt bib ems from MD office; getting check up for chronic back and leg pain; orthostatic positive; lying bp systolic 110's,  sat up dropped to 80s; pt c/o dizziness; 500 cc bolus given pta; 18 ga lac; last bp 142/68 lying; hr 50s, takes metoprolol  and amlodipine ; c/o R hamstring pain 5/10

## 2024-09-02 ENCOUNTER — Encounter: Payer: Self-pay | Admitting: Cardiology

## 2024-09-02 NOTE — Telephone Encounter (Signed)
 S/w [name] (dpr) and she reports that the pt's bp has gone down twice and both times were when he went to a Dr's office. The patient went to the ER, they d/c'd his amlodipine  and also a muscle relaxer that he was taking. She is keeping a BP log.   Appt made to see APP 09/05/24. Given ER precautions. She verbalized understanding.

## 2024-09-05 ENCOUNTER — Encounter: Payer: Self-pay | Admitting: Cardiology

## 2024-09-05 ENCOUNTER — Ambulatory Visit: Attending: Cardiology | Admitting: Cardiology

## 2024-09-05 VITALS — BP 134/68 | HR 61 | Ht 65.0 in | Wt 152.0 lb

## 2024-09-05 DIAGNOSIS — I951 Orthostatic hypotension: Secondary | ICD-10-CM | POA: Insufficient documentation

## 2024-09-05 DIAGNOSIS — R42 Dizziness and giddiness: Secondary | ICD-10-CM | POA: Insufficient documentation

## 2024-09-05 DIAGNOSIS — I10 Essential (primary) hypertension: Secondary | ICD-10-CM | POA: Insufficient documentation

## 2024-09-05 DIAGNOSIS — E782 Mixed hyperlipidemia: Secondary | ICD-10-CM | POA: Diagnosis not present

## 2024-09-05 DIAGNOSIS — G4733 Obstructive sleep apnea (adult) (pediatric): Secondary | ICD-10-CM | POA: Insufficient documentation

## 2024-09-05 NOTE — Patient Instructions (Signed)
 Medication Instructions:  None  *If you need a refill on your cardiac medications before your next appointment, please call your pharmacy*  Lab Work: None  If you have labs (blood work) drawn today and your tests are completely normal, you will receive your results only by: MyChart Message (if you have MyChart) OR A paper copy in the mail If you have any lab test that is abnormal or we need to change your treatment, we will call you to review the results.  Testing/Procedures: None   Follow-Up: At [facility], you and your health needs are our priority.  As part of our continuing mission to provide you with exceptional heart care, our providers are all part of one team.  This team includes your primary Cardiologist (physician) and Advanced Practice Providers or APPs (Physician Assistants and Nurse Practitioners) who all work together to provide you with the care you need, when you need it.  Your next appointment:   May 2026   Provider:   Madonna Large, DO    We recommend signing up for the patient portal called MyChart.  Sign up information is provided on this After Visit Summary.  MyChart is used to connect with patients for Virtual Visits (Telemedicine).  Patients are able to view lab/test results, encounter notes, upcoming appointments, etc.  Non-urgent messages can be sent to your provider as well.   To learn more about what you can do with MyChart, go to [email or web address].   Other Instructions None

## 2024-09-05 NOTE — Telephone Encounter (Signed)
Thank you for the update.   Keenan Trefry Castalia, DO, [facility]

## 2024-09-05 NOTE — Progress Notes (Signed)
 Cardiology Office Note:  .   Date:  09/05/2024  ID:  Cody Gonzalez, DOB 11-08-1945, MRN [id number] PCP: Marvene Prentice SAUNDERS, FNP   HeartCare Providers Cardiologist:  Cody Large, DO {  History of Present Illness: .   Cody Gonzalez is a 78 y.o. male with history of orthostatic hypotension, SIADH, hypertension, hyperlipidemia, gastritis, IDA, prostate cancer, chronic hyponatremia, OSA on CPAP     Orthostatic hypotension Vasodilator therapy previously discontinued with significant improvement. 09/2023 started on low-dose amlodipine  2.5 mg. Spironolactone  discontinued due to leg cramping.  Leg cramping prompted PAD workup but had normal ABIs and arterial Dopplers. 04/2024 EKG with subtle ST depressions inferiorly.  Asymptomatic.  Echocardiogram ordered demonstrating preserved biventricular function.  Mild MR.     Patient with history of orthostatic hypotension.  Last seen in office 03/2024 and overall doing well.  He was ambulating with a cane and on a stationary bike for at least 20 minutes without any exertional symptoms.  Blood pressure was improved after starting amlodipine  2.5 mg daily from prior appointment.  Patient recently contacted our office reporting 2 episodes of blood pressure dropping when at her nephrologist office and again at the orthopedic office.  Systolics in the 80s, patient appeared pale reportedly.  Finasteride and tizanidine recently added 2 weeks prior.  Tizanidine also added around the same time.  Seen in the ED.  Orthostatic negative.  Workup overall negative.  Troponins normal.  No arrhythmias noted on telemetry.  Sinus bradycardia heart rates in the 50-60s.  Tizanidine discontinued.  Amlodipine  eventually discontinued.  Today patient presents for follow-up after ED visit.  He is accompanied with his wife who has provided most of the interview.  She reports that he has had episodes of dizziness, no loss of consciousness at the orthopedic and  nephrologist office while sitting.  She says that the orthostatic hypotension has actually been stable since 2023 without any significant episodes until recently when finasteride and tizanidine were added on.  She only reports these 2 episodes.  Denies symptoms occurring during positional changes.  Symptoms occurred while just sitting at the doctor's office and suddenly become dizzy.  Episode happens for 10 to 15 minutes.  Episode is not accompanied by any palpitations, chest pain, shortness of breath.  He has had no cardiac related symptoms and continues to ambulate decently but now with a walker just for reassurance.  Of note he is followed by nephrology for concerns of SIADH as well.  Additionally has been followed by nephrology for cognitive issues.  Alzheimer's and vascular dementia and felt to be less likely.  ROS: Denies: Chest pain, shortness of breath, orthopnea, peripheral edema, palpitations, decreased exercise intolerance, fatigue, lightheadedness.   Studies Reviewed: .         Risk Assessment/Calculations:           Physical Exam:   VS:  BP 134/68   Pulse 61   Ht 5' 5 (1.651 m)   Wt 152 lb (68.9 kg)   SpO2 98%   BMI 25.29 kg/m    Wt Readings from Last 3 Encounters:  09/05/24 152 lb (68.9 kg)  09/01/24 150 lb (68 kg)  08/09/24 153 lb (69.4 kg)    GEN: Well nourished, well developed in no acute distress NECK: No JVD; No carotid bruits CARDIAC: RRR, no murmurs, rubs, gallops RESPIRATORY:  Clear to auscultation without rales, wheezing or rhonchi  ABDOMEN: Soft, non-tender, non-distended EXTREMITIES:  No edema; No deformity   ASSESSMENT AND PLAN: .  Dizziness Reporting episodes of dizziness while seated.  Does not happen with positional changes.  No loss of consciousness.  I think his presentation is likely multifactorial and exacerbated by SIADH, polypharmacy with finasteride and tizanidine recently been added, pain and possible hypotension.  He was completely  asymptomatic for about 2 years until these 2 medications were added.  Echocardiogram 04/2024 did not show any significant structural disease.  EKG benign with no pauses and he was monitored on telemetry at the ED without any significant arrhythmias.  No evidence of high-grade A-V conduction disease.  Would recommend that he follow-up with nephrology and urology to see if his medications could be contributing and to see if there are other alternatives for his urological issues.  Continue to correct secondary etiologies and maintain good p.o. intake. Will be more lenient on BP goals.  Amlodipine  2.5 mg was stopped. We discussed repeating echocardiogram and heart monitor. Wife and patient would like to defer for the time being, could consider in the future should he have continued recurrences despite correction of the above. Alfuzosin could possibly be a contributor.    Orthostatic hypotension Reportedly have been stable for the last 2 years per wife.   Hypertension BP reasonable given the above.  134/68 today.   Continue with carvedilol  6.25 mg twice daily and telmisartan  80 mg daily.  We can titrate back if needed should he show signs of persistent hypotension but wife reports blood pressure has been stable.  OSA On CPAP.  Follows with neurology.  Mild MR Asymptomatic.  Repeat echocardiogram 3 to 5 years.  Hyperlipidemia Continue atorvastatin  40 mg daily.  LDL 03/2024 was 51.  Right calf pain No evidence of PAD with reassuring ABIs/arterial Dopplers.  Dispo: Follow-up as planned with Dr. Michele May 2026.  Signed, Cody LITTIE Sluder, PA-C

## 2024-09-19 ENCOUNTER — Ambulatory Visit: Admitting: Podiatry

## 2024-09-19 ENCOUNTER — Encounter: Payer: Self-pay | Admitting: Podiatry

## 2024-09-19 DIAGNOSIS — M79675 Pain in left toe(s): Secondary | ICD-10-CM

## 2024-09-19 DIAGNOSIS — M79674 Pain in right toe(s): Secondary | ICD-10-CM

## 2024-09-19 DIAGNOSIS — B351 Tinea unguium: Secondary | ICD-10-CM | POA: Diagnosis not present

## 2024-09-19 NOTE — Progress Notes (Signed)
 This patient returns to the office for evaluation and treatment of long thick painful nails .  This patient is unable to trim his own nails since the patient cannot reach his feet.  Patient says the nails are painful walking and wearing his shoes.  He returns for preventive foot care services.  General Appearance  Alert, conversant and in no acute stress.  Vascular  Dorsalis pedis and posterior tibial  pulses are palpable  bilaterally.  Capillary return is within normal limits  bilaterally. Temperature is within normal limits  bilaterally.  Neurologic  Senn-Weinstein monofilament wire test within normal limits  bilaterally. Muscle power within normal limits bilaterally.  Nails Thick disfigured discolored nails with subungual debris hallux nails   bilaterally. No evidence of bacterial infection or drainage bilaterally. Subungual hematoma third toe right foot.  Orthopedic  No limitations of motion  feet .  No crepitus or effusions noted.  No bony pathology or digital deformities noted.  Skin  normotropic skin with no porokeratosis noted bilaterally.  No signs of infections or ulcers noted.     Onychomycosis  Pain in toes right foot  Pain in toes left foot  Debridement  of nails  1-5  B/L with a nail nipper.  Nails were then filed using a dremel tool with no incidents.    RTC  4  months   Cordella Bold DPM

## 2024-10-12 ENCOUNTER — Ambulatory Visit: Admitting: Podiatry

## 2024-10-17 ENCOUNTER — Ambulatory Visit: Admitting: Podiatry

## 2024-10-21 ENCOUNTER — Other Ambulatory Visit: Payer: Self-pay | Admitting: Cardiology

## 2024-10-21 DIAGNOSIS — I951 Orthostatic hypotension: Secondary | ICD-10-CM

## 2025-01-16 ENCOUNTER — Ambulatory Visit: Admitting: Podiatry

## 2025-03-13 ENCOUNTER — Ambulatory Visit: Admitting: Cardiology

## 2025-08-15 ENCOUNTER — Ambulatory Visit: Admitting: Adult Health
